# Patient Record
Sex: Female | Born: 1955 | Race: Black or African American | Hispanic: No | Marital: Married | Smoking: Never smoker
Health system: Southern US, Community
[De-identification: ages and names within clinical notes are randomized; demographics above are authoritative.]

## PROBLEM LIST (undated history)

## (undated) DIAGNOSIS — R87610 Atypical squamous cells of undetermined significance on cytologic smear of cervix (ASC-US): Secondary | ICD-10-CM

## (undated) DIAGNOSIS — I7300 Raynaud's syndrome without gangrene: Secondary | ICD-10-CM

## (undated) DIAGNOSIS — F419 Anxiety disorder, unspecified: Secondary | ICD-10-CM

## (undated) DIAGNOSIS — K8020 Calculus of gallbladder without cholecystitis without obstruction: Secondary | ICD-10-CM

## (undated) DIAGNOSIS — E119 Type 2 diabetes mellitus without complications: Secondary | ICD-10-CM

## (undated) DIAGNOSIS — R569 Unspecified convulsions: Secondary | ICD-10-CM

## (undated) DIAGNOSIS — R51 Headache: Secondary | ICD-10-CM

## (undated) DIAGNOSIS — R519 Headache, unspecified: Secondary | ICD-10-CM

## (undated) DIAGNOSIS — I10 Essential (primary) hypertension: Secondary | ICD-10-CM

## (undated) DIAGNOSIS — B1910 Unspecified viral hepatitis B without hepatic coma: Secondary | ICD-10-CM

## (undated) DIAGNOSIS — F32A Depression, unspecified: Secondary | ICD-10-CM

## (undated) DIAGNOSIS — E669 Obesity, unspecified: Secondary | ICD-10-CM

## (undated) DIAGNOSIS — F8081 Childhood onset fluency disorder: Secondary | ICD-10-CM

## (undated) DIAGNOSIS — G8929 Other chronic pain: Secondary | ICD-10-CM

## (undated) DIAGNOSIS — K219 Gastro-esophageal reflux disease without esophagitis: Secondary | ICD-10-CM

## (undated) DIAGNOSIS — F329 Major depressive disorder, single episode, unspecified: Secondary | ICD-10-CM

## (undated) DIAGNOSIS — K5790 Diverticulosis of intestine, part unspecified, without perforation or abscess without bleeding: Secondary | ICD-10-CM

## (undated) HISTORY — PX: COLONOSCOPY: SHX174

## (undated) HISTORY — DX: Anxiety disorder, unspecified: F41.9

## (undated) HISTORY — DX: Gastro-esophageal reflux disease without esophagitis: K21.9

## (undated) HISTORY — DX: Essential (primary) hypertension: I10

## (undated) HISTORY — DX: Headache: R51

## (undated) HISTORY — DX: Major depressive disorder, single episode, unspecified: F32.9

## (undated) HISTORY — DX: Childhood onset fluency disorder: F80.81

## (undated) HISTORY — DX: Type 2 diabetes mellitus without complications: E11.9

## (undated) HISTORY — DX: Diverticulosis of intestine, part unspecified, without perforation or abscess without bleeding: K57.90

## (undated) HISTORY — DX: Unspecified viral hepatitis B without hepatic coma: B19.10

## (undated) HISTORY — DX: Depression, unspecified: F32.A

## (undated) HISTORY — DX: Calculus of gallbladder without cholecystitis without obstruction: K80.20

## (undated) HISTORY — DX: Obesity, unspecified: E66.9

## (undated) HISTORY — DX: Unspecified convulsions: R56.9

## (undated) HISTORY — DX: Raynaud's syndrome without gangrene: I73.00

## (undated) HISTORY — DX: Atypical squamous cells of undetermined significance on cytologic smear of cervix (ASC-US): R87.610

## (undated) HISTORY — DX: Headache, unspecified: R51.9

## (undated) HISTORY — DX: Other chronic pain: G89.29

---

## 1981-04-24 DIAGNOSIS — B169 Acute hepatitis B without delta-agent and without hepatic coma: Secondary | ICD-10-CM | POA: Insufficient documentation

## 1982-03-19 ENCOUNTER — Encounter: Payer: Self-pay | Admitting: Gastroenterology

## 1982-04-28 ENCOUNTER — Encounter: Payer: Self-pay | Admitting: Gastroenterology

## 1982-07-04 ENCOUNTER — Encounter: Payer: Self-pay | Admitting: Gastroenterology

## 1997-09-24 ENCOUNTER — Emergency Department (HOSPITAL_COMMUNITY): Admission: EM | Admit: 1997-09-24 | Discharge: 1997-09-24 | Payer: Self-pay | Admitting: Emergency Medicine

## 1998-01-29 ENCOUNTER — Other Ambulatory Visit: Admission: RE | Admit: 1998-01-29 | Discharge: 1998-01-29 | Payer: Self-pay | Admitting: Family Medicine

## 1998-04-24 HISTORY — PX: CHOLECYSTECTOMY: SHX55

## 1998-04-24 HISTORY — PX: OTHER SURGICAL HISTORY: SHX169

## 1998-08-17 ENCOUNTER — Other Ambulatory Visit: Admission: RE | Admit: 1998-08-17 | Discharge: 1998-08-17 | Payer: Self-pay | Admitting: *Deleted

## 1998-12-28 ENCOUNTER — Encounter (INDEPENDENT_AMBULATORY_CARE_PROVIDER_SITE_OTHER): Payer: Self-pay

## 1998-12-28 ENCOUNTER — Observation Stay (HOSPITAL_COMMUNITY): Admission: RE | Admit: 1998-12-28 | Discharge: 1998-12-29 | Payer: Self-pay | Admitting: *Deleted

## 2000-08-09 ENCOUNTER — Other Ambulatory Visit: Admission: RE | Admit: 2000-08-09 | Discharge: 2000-08-09 | Payer: Self-pay | Admitting: *Deleted

## 2004-12-02 ENCOUNTER — Encounter: Admission: RE | Admit: 2004-12-02 | Discharge: 2004-12-02 | Payer: Self-pay | Admitting: Family Medicine

## 2008-07-09 ENCOUNTER — Encounter: Admission: RE | Admit: 2008-07-09 | Discharge: 2008-07-09 | Payer: Self-pay | Admitting: Family Medicine

## 2008-09-30 ENCOUNTER — Encounter: Admission: RE | Admit: 2008-09-30 | Discharge: 2008-09-30 | Payer: Self-pay | Admitting: Family Medicine

## 2008-09-30 LAB — HM MAMMOGRAPHY: HM Mammogram: NEGATIVE

## 2008-10-05 ENCOUNTER — Encounter (INDEPENDENT_AMBULATORY_CARE_PROVIDER_SITE_OTHER): Payer: Self-pay | Admitting: Orthopedic Surgery

## 2008-10-05 ENCOUNTER — Ambulatory Visit: Payer: Self-pay

## 2008-10-13 ENCOUNTER — Ambulatory Visit (HOSPITAL_COMMUNITY): Admission: RE | Admit: 2008-10-13 | Discharge: 2008-10-13 | Payer: Self-pay | Admitting: Family Medicine

## 2008-10-13 ENCOUNTER — Encounter: Payer: Self-pay | Admitting: Emergency Medicine

## 2008-11-10 ENCOUNTER — Ambulatory Visit (HOSPITAL_COMMUNITY): Admission: RE | Admit: 2008-11-10 | Discharge: 2008-11-10 | Payer: Self-pay | Admitting: Family Medicine

## 2008-11-10 ENCOUNTER — Encounter: Payer: Self-pay | Admitting: Emergency Medicine

## 2009-01-13 ENCOUNTER — Ambulatory Visit: Payer: Self-pay | Admitting: Emergency Medicine

## 2009-01-13 DIAGNOSIS — R7303 Prediabetes: Secondary | ICD-10-CM | POA: Insufficient documentation

## 2009-01-13 DIAGNOSIS — I10 Essential (primary) hypertension: Secondary | ICD-10-CM | POA: Insufficient documentation

## 2009-01-13 DIAGNOSIS — R0602 Shortness of breath: Secondary | ICD-10-CM | POA: Insufficient documentation

## 2009-04-29 ENCOUNTER — Emergency Department (HOSPITAL_COMMUNITY): Admission: EM | Admit: 2009-04-29 | Discharge: 2009-04-29 | Payer: Self-pay | Admitting: Emergency Medicine

## 2009-04-29 LAB — CONVERTED CEMR LAB
Basophils Relative: 1 %
Eosinophils Relative: 2 %
HCT: 40 %
Hemoglobin: 13.6 g/dL
Lymphocytes, automated: 35 %
MCV: 90 fL
Monocytes Relative: 8 %
Neutrophils Relative %: 55 %
Platelets: 309 10*3/uL
RBC: 4.21 M/uL
RDW: 14.8 %
WBC: 6.2 10*3/uL

## 2009-05-14 ENCOUNTER — Encounter (INDEPENDENT_AMBULATORY_CARE_PROVIDER_SITE_OTHER): Payer: Self-pay | Admitting: *Deleted

## 2009-06-08 ENCOUNTER — Ambulatory Visit: Payer: Self-pay | Admitting: Gastroenterology

## 2009-06-08 DIAGNOSIS — R12 Heartburn: Secondary | ICD-10-CM | POA: Insufficient documentation

## 2009-06-08 DIAGNOSIS — R109 Unspecified abdominal pain: Secondary | ICD-10-CM | POA: Insufficient documentation

## 2009-06-09 LAB — CONVERTED CEMR LAB
ALT: 16 units/L (ref 0–35)
AST: 18 units/L (ref 0–37)
Albumin: 3.9 g/dL (ref 3.5–5.2)
Alkaline Phosphatase: 80 units/L (ref 39–117)
BUN: 13 mg/dL (ref 6–23)
Basophils Absolute: 0.2 10*3/uL — ABNORMAL HIGH (ref 0.0–0.1)
Basophils Relative: 4.1 % — ABNORMAL HIGH (ref 0.0–3.0)
Bilirubin, Direct: 0.1 mg/dL (ref 0.0–0.3)
CO2: 30 meq/L (ref 19–32)
Calcium: 9.4 mg/dL (ref 8.4–10.5)
Chloride: 102 meq/L (ref 96–112)
Creatinine, Ser: 0.8 mg/dL (ref 0.4–1.2)
Eosinophils Absolute: 0.1 10*3/uL (ref 0.0–0.7)
Eosinophils Relative: 2.1 % (ref 0.0–5.0)
GFR calc non Af Amer: 95.56 mL/min (ref >60)
Glucose, Bld: 83 mg/dL (ref 70–99)
HCT: 39.6 % (ref 36.0–46.0)
Hemoglobin: 13.3 g/dL (ref 12.0–15.0)
Lipase: 13 units/L (ref 11.0–59.0)
Lymphocytes Relative: 35.3 % (ref 12.0–46.0)
Lymphs Abs: 1.8 10*3/uL (ref 0.7–4.0)
MCHC: 33.6 g/dL (ref 30.0–36.0)
MCV: 90.9 fL (ref 78.0–100.0)
Monocytes Absolute: 0.2 10*3/uL (ref 0.1–1.0)
Monocytes Relative: 4.8 % (ref 3.0–12.0)
Neutro Abs: 2.8 10*3/uL (ref 1.4–7.7)
Neutrophils Relative %: 53.7 % (ref 43.0–77.0)
Platelets: 281 10*3/uL (ref 150.0–400.0)
Potassium: 3.7 meq/L (ref 3.5–5.1)
RBC: 4.35 M/uL (ref 3.87–5.11)
RDW: 14.3 % (ref 11.5–14.6)
Sodium: 139 meq/L (ref 135–145)
TSH: 0.95 microintl units/mL (ref 0.35–5.50)
Total Bilirubin: 0.4 mg/dL (ref 0.3–1.2)
Total Protein: 7.5 g/dL (ref 6.0–8.3)
WBC: 5.1 10*3/uL (ref 4.5–10.5)

## 2009-06-28 ENCOUNTER — Ambulatory Visit: Payer: Self-pay | Admitting: Internal Medicine

## 2009-07-07 ENCOUNTER — Ambulatory Visit: Payer: Self-pay | Admitting: Gastroenterology

## 2009-07-07 DIAGNOSIS — K219 Gastro-esophageal reflux disease without esophagitis: Secondary | ICD-10-CM | POA: Insufficient documentation

## 2009-07-21 ENCOUNTER — Telehealth: Payer: Self-pay | Admitting: Gastroenterology

## 2009-07-21 ENCOUNTER — Ambulatory Visit: Payer: Self-pay | Admitting: Internal Medicine

## 2009-07-21 DIAGNOSIS — I7300 Raynaud's syndrome without gangrene: Secondary | ICD-10-CM | POA: Insufficient documentation

## 2009-07-21 DIAGNOSIS — E669 Obesity, unspecified: Secondary | ICD-10-CM | POA: Insufficient documentation

## 2009-07-23 ENCOUNTER — Ambulatory Visit: Payer: Self-pay | Admitting: Gastroenterology

## 2009-07-23 LAB — HM COLONOSCOPY: HM Colonoscopy: NORMAL

## 2009-07-26 ENCOUNTER — Encounter (INDEPENDENT_AMBULATORY_CARE_PROVIDER_SITE_OTHER): Payer: Self-pay | Admitting: *Deleted

## 2009-09-10 ENCOUNTER — Ambulatory Visit: Payer: Self-pay | Admitting: Internal Medicine

## 2009-09-10 DIAGNOSIS — L02419 Cutaneous abscess of limb, unspecified: Secondary | ICD-10-CM | POA: Insufficient documentation

## 2009-09-10 DIAGNOSIS — F411 Generalized anxiety disorder: Secondary | ICD-10-CM | POA: Insufficient documentation

## 2009-09-10 DIAGNOSIS — L03119 Cellulitis of unspecified part of limb: Secondary | ICD-10-CM | POA: Insufficient documentation

## 2009-10-08 ENCOUNTER — Ambulatory Visit: Payer: Self-pay | Admitting: Internal Medicine

## 2009-10-08 DIAGNOSIS — Z780 Asymptomatic menopausal state: Secondary | ICD-10-CM | POA: Insufficient documentation

## 2010-01-19 ENCOUNTER — Ambulatory Visit: Payer: Self-pay | Admitting: Internal Medicine

## 2010-01-19 DIAGNOSIS — G2581 Restless legs syndrome: Secondary | ICD-10-CM | POA: Insufficient documentation

## 2010-05-26 NOTE — Letter (Signed)
Summary: [facility] Instructions  [facility]  [address] [place], [place] [address]   Phone: [phone]  Fax: [phone]       Suzanne [name]    [birth year]    MRN: [id number]        Procedure Day /Date: Friday April 1st, 2011     Arrival Time: 2:00pm     Procedure Time: 3:00pm     Location of Procedure:                    _ x_   Endoscopy Center (4th Floor)                        PREPARATION FOR COLONOSCOPY WITH MOVIPREP   Starting 5 days prior to your procedure  07/19/09 do not eat nuts, seeds, popcorn, corn, beans, peas,  salads, or any raw vegetables.  Do not take any fiber supplements (e.g. Metamucil, Citrucel, and Benefiber).  THE DAY BEFORE YOUR PROCEDURE         DATE:  07/22/09   DAY:  Thursday   1.  Drink clear liquids the entire day-NO SOLID FOOD  2.  Do not drink anything colored red or purple.  Avoid juices with pulp.  No orange juice.  3.  Drink at least 64 oz. (8 glasses) of fluid/clear liquids during the day to prevent dehydration and help the prep work efficiently.  CLEAR LIQUIDS INCLUDE: Water Jello Ice Popsicles Tea (sugar ok, no milk/cream) Powdered fruit flavored drinks Coffee (sugar ok, no milk/cream) Gatorade Juice: apple, white grape, white cranberry  Lemonade Clear bullion, consomm, broth Carbonated beverages (any kind) Strained chicken noodle soup Hard Candy                             4.  In the morning, mix first dose of MoviPrep solution:    Empty 1 Pouch A and 1 Pouch B into the disposable container    Add lukewarm drinking water to the top line of the container. Mix to dissolve    Refrigerate (mixed solution should be used within 24 hrs)  5.  Begin drinking the prep at 5:00 p.m. The MoviPrep container is divided by 4 marks.   Every 15 minutes drink the solution down to the next mark (approximately 8 oz) until the full liter is complete.   6.  Follow completed prep with 16 oz of clear liquid of  your choice (Nothing red or purple).  Continue to drink clear liquids until bedtime.  7.  Before going to bed, mix second dose of MoviPrep solution:    Empty 1 Pouch A and 1 Pouch B into the disposable container    Add lukewarm drinking water to the top line of the container. Mix to dissolve    Refrigerate  THE DAY OF YOUR PROCEDURE      DATE:  07/23/09  DAY:  Friday  Beginning at  11:00 a.m. (5 hours before procedure):         1. Every 15 minutes, drink the solution down to the next mark (approx 8 oz) until the full liter is complete.  2. Follow completed prep with 16 oz. of clear liquid of your choice.    3. You may drink clear liquids until  1:00pm  (2 HOURS BEFORE PROCEDURE).   MEDICATION INSTRUCTIONS  Unless otherwise instructed, you should take regular prescription medications with a small sip of  water   as early as possible the morning of your procedure.          OTHER INSTRUCTIONS  You will need a responsible adult at least 57 years of age to accompany you and drive you home.   This person must remain in the waiting room during your procedure.  Wear loose fitting clothing that is easily removed.  Leave jewelry and other valuables at home.  However, you may wish to bring a book to read or  an iPod/MP3 player to listen to music as you wait for your procedure to start.  Remove all body piercing jewelry and leave at home.  Total time from sign-in until discharge is approximately 2-3 hours.  You should go home directly after your procedure and rest.  You can resume normal activities the  day after your procedure.  The day of your procedure you should not:   Drive   Make legal decisions   Operate machinery   Drink alcohol   Return to work  You will receive specific instructions about eating, activities and medications before you leave.    The above instructions have been reviewed and explained to me by   Marchelle Folks.     I fully understand and can  verbalize these instructions _____________________________ Date _________

## 2010-05-26 NOTE — Assessment & Plan Note (Signed)
Summary: stomach trouble, diarrhea, change in bms, right side pain.Marland Kitchenem   History of Present Illness Visit Type: Initial Visit Primary GI MD: Elie Goody MD [facility] Primary Provider: Sheliah Hatch Requesting Provider: n/a Chief Complaint: Periumbilical pain with esophageal burning History of Present Illness:   This is a 55 year old female, who relates problems with intermittent lower abdominal burning pain since a diarrheal illness in December. Her diarrhea resolved, but she has episodic pain, that does not appear to be related to bowel movements or meals. It does relate occasionally to gas and bloating. She relates occasional reflux symptoms and burning substernal pain.  She thinks she had a colonoscopy performed about 5 years ago, but cannot recall any specifics.   GI Review of Systems    Reports abdominal pain and  chest pain.     Location of  Abdominal pain: lower abdomen.    Denies acid reflux, belching, bloating, dysphagia with liquids, dysphagia with solids, heartburn, loss of appetite, nausea, vomiting, vomiting blood, weight loss, and  weight gain.      Reports diarrhea.     Denies anal fissure, black tarry stools, change in bowel habit, constipation, diverticulosis, fecal incontinence, heme positive stool, hemorrhoids, irritable bowel syndrome, jaundice, light color stool, liver problems, rectal bleeding, and  rectal pain.   Current Medications (verified): 1)  Metoprolol Tartrate 100 Mg Tabs (Metoprolol Tartrate) .Marland Kitchen.. 1 By Mouth Daily 2)  Junel Fe 1/20 1-20 Mg-Mcg Tabs (Norethin Ace-Eth Estrad-Fe) .Marland Kitchen.. 1 By Mouth Daily  Allergies (verified): 1)  ! * Seafood  Past History:  Past Medical History: PRE-DIABETES (ICD-790.29) HYPERTENSION (ICD-401.9) Anxiety Disorder Arthritis Chronic Headaches Diverticulosis Acute Hepatitis B 1983 Hypertension Obesity  Past Surgical History: Cholecystectomy  2000  Family History: Allergies--brother and son Family History Lung  Cancer==father and maternal aunt Family History Colon Cancer--maternal aunt Family History of Clotting disorder:   Social History: Reviewed history from 01/13/2009 and no changes required. [place].  Patient states never smoker.  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         The pertinent positives and negatives are noted as above and in the HPI. All other ROS were reviewed and were negative.   Vital Signs:  Patient profile:   55 year old female Height:      64 inches Weight:      177 pounds BMI:     30.49 BSA:     1.86 Pulse rate:   80 / minute Pulse rhythm:   regular BP sitting:   136 / 72  (left arm)  Vitals Entered By: Merri Ray CMA Duncan Dull) (June 08, 2009 3:02 PM)  Physical Exam  General:  Well developed, well nourished, no acute distress. Head:  Normocephalic and atraumatic. Eyes:  PERRLA, no icterus. Ears:  Normal auditory acuity. Mouth:  No deformity or lesions, dentition normal. Neck:  Supple; no masses or thyromegaly. Lungs:  Clear throughout to auscultation. Heart:  Regular rate and rhythm; no murmurs, rubs,  or bruits. Abdomen:  Soft and nondistended. No masses, hepatosplenomegaly or hernias noted. Normal bowel sounds. Minimal lower abdominal tenderness to deep palpation without rebound or guarding. Msk:  Symmetrical with no gross deformities. Normal posture. Pulses:  Normal pulses noted. Extremities:  No  clubbing, cyanosis, edema or deformities noted. Neurologic:  Alert and  oriented x4;  grossly normal neurologically. Skin:  Intact without significant lesions or rashes. Cervical Nodes:  No significant cervical adenopathy. Axillary Nodes:  No significant axillary  adenopathy. Inguinal Nodes:  No significant inguinal adenopathy. Psych:  Alert and cooperative. Normal mood and affect.  Impression & Recommendations:  Problem # 1:  ABDOMINAL PAIN OTHER SPECIFIED SITE (ICD-789.09) Lower abdominal pain, associated with gas and bloating. Blood work and stool Hemoccults as below. Attempts to obtain prior colonoscopy records. Trial of anti-spasmodic. Orders: TLB-BMP (Basic Metabolic Panel-BMET) (80048-METABOL) TLB-Hepatic/Liver Function Pnl (80076-HEPATIC) TLB-CBC Platelet - w/Differential (85025-CBCD) TLB-TSH (Thyroid Stimulating Hormone) (84443-TSH) TLB-Lipase (83690-LIPASE)  GI Hemoccult Cards #3 (take home) (Hem cards #3)  Problem # 2:  HEARTBURN (ICD-787.1) Possible GERD. Trial of acid suppressants and antireflux measures.  Patient Instructions: 1)  Get your labs drawn today in the basement.  2)  You have been scheduled to see Dr. Felicity Coyer for a new patient appt on 06/28/09 8:00am. Call if you need to reschedule your appt to avoid the n/s fee.  3)  Call us back with the information about your past colonoscopy report so you do not have to repeat this procedure.  4)  Please schedule a follow-up appointment in 4  weeks.  5)  Copy sent to : Rene Paci, MD 6)  The medication list was reviewed and reconciled.  All changed / newly prescribed medications were explained.  A complete medication list was provided to the patient / caregiver.  Prescriptions: GLYCOPYRROLATE 2 MG TABS (GLYCOPYRROLATE) one tablet by mouth two times a day  #60 x 5   Entered by:   Christie Nottingham CMA (AAMA)   Authorized by:   Meryl Dare MD [facility]   Signed by:   Newt Lukes MD on 06/09/2009   Method used:   Electronically to        [facility] (retail)       [address]       [place], [place]  [address]       Ph: [phone]       Fax: [phone]   RxID:   [id number] OMEPRAZOLE 20 MG CPDR (OMEPRAZOLE) one tablet by  mouth every morning  #30 x 5   Entered by:   Christie Nottingham CMA (AAMA)   Authorized by:   Meryl Dare MD [facility]   Signed by:   Newt Lukes MD on 06/09/2009   Method used:   Electronically to        [facility] (retail)       [address]       [place], [place]  [address]       Ph: [phone]       Fax: [phone]   RxID:   [id number]

## 2010-05-26 NOTE — Assessment & Plan Note (Signed)
Summary: [name] /  NWS #   Vital Signs:  Patient profile:   55 year old female Height:      64 inches (162.56 cm) Weight:      179.8 pounds (81.73 kg) Temp:     97.5 degrees F (36.39 degrees C) oral Pulse rate:   721 / minute BP sitting:   134 / 72  (left arm) Cuff size:   regular  Vitals Entered By: Suzanne [name] (July 21, 2009 3:09 PM) CC: New patient Is Patient Diabetic? No Pain Assessment Patient in pain? no        Primary Care Provider:  Newt Lukes MD  CC:  New patient.  History of Present Illness: new pt to me and our division, here to est care with new PCP prev followed with Suzanne [name] -  1) concern about finger tip numbness and discoloration - symptoms occur in any fingers, not always same one or same pattern - symptoms worse with cold weather symptoms a/w "pins and needle feeling" - episodes last few minutes to 30 minutes per episode - no fever, no pain a/w color change to blue/black  2) requests PAP and refill on OCP - reports med rx by PCP to keep from having hot flashes - not having periods -  3) GERD - sees GI for same - reports compliance with ongoing medical treatment and no changes in medication dose or frequency. denies adverse side effects related to current therapy.   4) HTN - reports compliance with ongoing medical treatment and no changes in medication dose or frequency. denies adverse side effects related to current therapy.   5) obesity with hx "preDM" - has lost over 30# intentionally with diet and exercise efforts -      Preventive Screening-Counseling & Management  Alcohol-Tobacco     Smoking Status: quit  Clinical Review Panels:  Prevention   Last Mammogram:  [facility].   No specific mammographic evidence of malignancy.  Assessment: BIRADS 1. (09/30/2008)  CBC   WBC:  5.1 (06/08/2009)   RBC:  4.35 (06/08/2009)   Hgb:  13.3 (06/08/2009)   Hct:  39.6 (06/08/2009)   Platelets:   281.0 (06/08/2009)   MCV  90.9 (06/08/2009)   MCHC  33.6 (06/08/2009)   RDW  14.3 (06/08/2009)   PMN:  53.7 (06/08/2009)   Lymphs:  35.3 (06/08/2009)   Monos:  4.8 (06/08/2009)   Eosinophils:  2.1 (06/08/2009)   Basophil:  4.1 (06/08/2009)  Complete Metabolic Panel   Glucose:  83 (06/08/2009)   Sodium:  139 (06/08/2009)   Potassium:  3.7 (06/08/2009)   Chloride:  102 (06/08/2009)   CO2:  30 (06/08/2009)   BUN:  13 (06/08/2009)   Creatinine:  0.8 (06/08/2009)   Albumin:  3.9 (06/08/2009)   Total Protein:  7.5 (06/08/2009)   Calcium:  9.4 (06/08/2009)   Total Bili:  0.4 (06/08/2009)   Alk Phos:  80 (06/08/2009)   SGPT (ALT):  16 (06/08/2009)   SGOT (AST):  18 (06/08/2009)   -  Date:  04/29/2009    Lymphs: 35  Current Medications (verified): 1)  Metoprolol Tartrate 100 Mg Tabs (Metoprolol Tartrate) .Marland Kitchen.. 1 By Mouth Daily 2)  Junel Fe 1/20 1-20 Mg-Mcg Tabs (Norethin Ace-Eth Estrad-Fe) .Marland Kitchen.. 1 By Mouth Daily 3)  Omeprazole 20 Mg Cpdr (Omeprazole) .... One Tablet By Mouth Every Morning 4)  Moviprep 100 Gm  Solr (Peg-Kcl-Nacl-Nasulf-Na Asc-C) .... As Per Prep Instructions.  Allergies (verified): 1)  ! *  Seafood  Past History:  Past Medical History: hx PRE-DIABETES Anxiety Disorder Arthritis Chronic Headaches Diverticulosis Acute Hepatitis B 1983 Hypertension Obesity  MD rooster: GI - stark pulm - byrum  Past Surgical History: Reviewed history from 06/08/2009 and no changes required. Cholecystectomy  2000  Family History: Allergies--brother and son Family History Lung Cancer==father and maternal aunt Family History Colon Cancer--maternal aunt Family History of Clotting disorder:   mom - 84y - bone dz dad - expired in 79s - lung ca  Social History: [place] up local, [place] farm.  Patient states never smoker. 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 I have reviewed all other systems and they were negative.   Physical Exam  General:  overweight-appearing.  alert, well-developed, well-nourished, and cooperative to examination.   well dressed, marked stutter with conversation Eyes:  vision grossly intact; pupils equal, round and reactive to light.  conjunctiva and lids normal.    Lungs:  normal respiratory effort, no intercostal retractions or use of accessory muscles; normal breath sounds bilaterally - no crackles and no wheezes.    Heart:  normal rate, regular rhythm, no murmur, and no rub. BLE without edema. normal DP pulses and normal cap refill in all 4 extremities    Abdomen:  Soft, nontender and nondistended. No masses, hepatosplenomegaly or hernias noted. Normal bowel sounds. Genitalia:  defer to gyn  Msk:  No deformity or scoliosis noted of thoracic or lumbar spine.   Neurologic:  alert & oriented X3 and cranial nerves II-XII symetrically intact.  strength normal in all extremities, sensation intact to light touch, and gait normal. marked speech stuttering but without dysarthria or aphasia; follows commands with good comprehension.  Skin:  +raynaud's changes - bluish tips in R 2,3 and 4; L 2 +4 - cool to touch initially - then warms and "pinks" within 5 minutes during OV interview Psych:  Oriented X3, memory intact for recent and remote, normally interactive, good eye contact, not anxious appearing, not depressed appearing, and not agitated.      Impression & Recommendations:  Problem # 1:  RAYNAUDS SYNDROME (ICD-443.0) reassurance and education material provided to pt today -  Time spent with patient 45 minutes, more than 50% of this time was spent counseling patient on raynaud's, need for weight loss and exercise, and need for gyn eval to asst with hormone mgmt -  Problem # 2:  OBESITY (ICD-278.00)  Ht: 64 (07/21/2009)   Wt: 179.8 (07/21/2009)   BMI: 4.70 (07/07/2009)  Problem # 3:  HYPERTENSION  (ICD-401.9)  Her updated medication list for this problem includes:    Metoprolol Tartrate 100 Mg Tabs (Metoprolol tartrate) .Marland Kitchen... 1 by mouth daily  BP today: 134/72 Prior BP: 124/82 (07/07/2009)  Labs Reviewed: K+: 3.7 (06/08/2009) Creat: : 0.8 (06/08/2009)     Problem # 4:  GERD (ICD-530.81)  The following medications were removed from the medication list:    Glycopyrrolate 2 Mg Tabs (Glycopyrrolate) ..... One tablet by mouth two times a day Her updated medication list for this problem includes:    Omeprazole 20 Mg Cpdr (Omeprazole) ..... One tablet by mouth every morning  Labs Reviewed: Hgb: 13.3 (06/08/2009)   Hct: 39.6 (06/08/2009)  Complete Medication List: 1)  Metoprolol Tartrate 100 Mg Tabs (Metoprolol tartrate) .Marland Kitchen.. 1 by mouth daily 2)  Junel Fe 1/20 1-20  Mg-mcg Tabs (Norethin ace-eth estrad-fe) .Marland Kitchen.. 1 by mouth daily 3)  Omeprazole 20 Mg Cpdr (Omeprazole) .... One tablet by mouth every morning 4)  Moviprep 100 Gm Solr (Peg-kcl-nacl-nasulf-na asc-c) .... As per prep instructions.  Other Orders: Gynecologic Referral (Gyn)  Patient Instructions: 1)  it was good to see you today. 2)  your finger tip discoloration is called "raynaud's" - information given to you about this - nothing to worry about, not diabetes! 3)  we'll make referral to gynecology for a PAP and to discuss your hormone pills because they may need to make changes in your medicatins. Our office will contact you regarding this appointment once made.  4)  keep up the good work with your weight loss efforts! 5)  no medicine changes today - call for refills as needed - 6)  Please schedule a follow-up appointment in 6 months to recheck weight and blood pressure, call sooner if problems.  Prescriptions: JUNEL FE 1/20 1-20 MG-MCG TABS (NORETHIN ACE-ETH ESTRAD-FE) 1 by mouth daily  #1 x 0   Entered and Authorized by:   Suzanne Lukes MD   Signed by:   Suzanne Lukes MD on 07/21/2009   Method used:    Electronically to        [facility] (retail)       [address]       [place], [place]  [address]       Ph: [phone]       Fax: [phone]   RxID:   [id number]    Mammogram  Procedure date:  09/30/2008  Findings:      [facility].   No specific mammographic evidence of malignancy.  Assessment: BIRADS 1.

## 2010-05-26 NOTE — Procedures (Signed)
Summary: Colonoscopy  Patient: [name] Note: All result statuses are Final unless otherwise noted.  Tests: (1) Colonoscopy (COL)   COL Colonoscopy           DONE     [facility]     [address]     [place], [place]  [address]           COLONOSCOPY PROCEDURE REPORT           PATIENT:  Suzanne [name], Suzanne [name]  MR#:  [id number]     BIRTHDATE:  01-12-1956, 53 yrs. old  GENDER:  female           ENDOSCOPIST:  Judie Petit T. Russella Dar, MD, [facility]           PROCEDURE DATE:  07/23/2009     PROCEDURE:  Colonoscopy 95[address]     ASA CLASS:  Class II     INDICATIONS:  1) Routine Risk Screening           MEDICATIONS:   Fentanyl 100 mcg IV, Versed 10 mg IV           DESCRIPTION OF PROCEDURE:   After the risks benefits and     alternatives of the procedure were thoroughly explained, informed     consent was obtained.  Digital rectal exam was performed and     revealed no abnormalities.   The LB [id number]-[id number] endoscope     was introduced through the anus and advanced to the cecum, which     was identified by both the appendix and ileocecal valve, without     limitations.  The quality of the prep was excellent, using     MoviPrep.  The instrument was then slowly withdrawn as the colon     was fully examined.     <<PROCEDUREIMAGES>>           FINDINGS:  Scattered diverticula were found in the transverse     colon. Moderate diverticulosis was found in the sigmoid to     descending colon segments.  A normal appearing cecum, ileocecal     valve, and appendiceal orifice were identified. The ascending,     hepatic flexure, splenic flexure, and rectum appeared     unremarkable.  Retroflexed views in the rectum revealed no     abnormalities.  The time to cecum =  3  minutes. The scope was     then withdrawn (time =  9.25  min) from the patient and the     procedure completed.           COMPLICATIONS:  None           ENDOSCOPIC IMPRESSION:     1) Diverticula, scattered in the  transverse colon     2) Moderate diverticulosis in the sigmoid to descending colon           RECOMMENDATIONS:     1) High fiber diet with liberal fluid intake.     2) Continue current colorectal screening recommendations for     "routine risk" patients with a repeat colonoscopy in 10 years.           Venita Lick. Russella Dar, MD, [facility]           CC: Windle Guard, MD           n.     Rosalie DoctorVenita Lick. Momen Ham at 07/23/2009 03:31 PM           [place], [place], [id number]  Note: An exclamation mark (!) indicates a result that was not dispersed into the flowsheet. Document Creation Date: 07/23/2009 3:32 PM _______________________________________________________________________  (1) Order result status: Final Collection or observation date-time: 07/23/2009 15:23 Requested date-time:  Receipt date-time:  Reported date-time:  Referring Physician:   Ordering Physician: Claudette Head [phone]) Specimen Source:  Source: Launa Grill Order Number: [id number] Lab site:   Appended Document: Colonoscopy    Clinical Lists Changes  Observations: Added new observation of COLONNXTDUE: 07/2019 (07/23/2009 16:30)

## 2010-05-26 NOTE — Assessment & Plan Note (Signed)
Summary: 4 WEEKS F/U..AM.   History of Present Illness Visit Type: Follow-up Visit Primary GI MD: Elie Goody MD [facility] Primary Provider: Sheliah Hatch Requesting Provider: n/a Chief Complaint: follow up from abdominal pain and diarrhea.  Pt states the abdominal pain and diarrhea are better, but she is having "spasms" in the middle of her back History of Present Illness:   This is a return office visit for lower abdominal pain and heartburn, which completely resolved with the use of glycopyrrolate and omeprazole. She did not return her stool Hemoccults.   GI Review of Systems      Denies abdominal pain, acid reflux, belching, bloating, chest pain, dysphagia with liquids, dysphagia with solids, heartburn, loss of appetite, nausea, vomiting, vomiting blood, weight loss, and  weight gain.        Denies anal fissure, black tarry stools, change in bowel habit, constipation, diarrhea, diverticulosis, fecal incontinence, heme positive stool, hemorrhoids, irritable bowel syndrome, jaundice, light color stool, liver problems, rectal bleeding, and  rectal pain.   Current Medications (verified): 1)  Metoprolol Tartrate 100 Mg Tabs (Metoprolol Tartrate) .Marland Kitchen.. 1 By Mouth Daily 2)  Junel Fe 1/20 1-20 Mg-Mcg Tabs (Norethin Ace-Eth Estrad-Fe) .Marland Kitchen.. 1 By Mouth Daily 3)  Omeprazole 20 Mg Cpdr (Omeprazole) .... One Tablet By Mouth Every Morning 4)  Glycopyrrolate 2 Mg Tabs (Glycopyrrolate) .... One Tablet By Mouth Two Times A Day  Allergies (verified): 1)  ! * Seafood  Past History:  Past Medical History: Reviewed history from 06/08/2009 and no changes required. PRE-DIABETES (ICD-790.29) HYPERTENSION (ICD-401.9) Anxiety Disorder Arthritis Chronic Headaches Diverticulosis Acute Hepatitis B 1983 Hypertension Obesity  Past Surgical History: Reviewed history from 06/08/2009 and no changes required. Cholecystectomy  2000  Family History: Reviewed history from 06/08/2009 and no changes  required. Allergies--brother and son Family History Lung Cancer==father and maternal aunt Family History Colon Cancer--maternal aunt Family History of Clotting disorder:   Social History: Reviewed history from 01/13/2009 and no changes required. [place] farm.  Patient states never smoker.  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 All other ROS were reviewed and were negative.   Vital Signs:  Patient profile:   55 year old female Height:      164 inches Weight:      179 pounds BMI:     4.70 Pulse rate:   72 / minute Pulse rhythm:   regular BP sitting:   124 / 82  (left arm) Cuff size:   regular  Vitals Entered By: Francee Piccolo CMA Duncan Dull) (July 07, 2009 4:18 PM)  Physical Exam  General:  Well developed, well nourished, no acute distress. Head:  Normocephalic and atraumatic. Eyes:  PERRLA, no icterus. Mouth:  No deformity or lesions, dentition normal. Lungs:  Clear throughout to auscultation. Heart:  Regular rate and rhythm; no murmurs, rubs,  or bruits. Abdomen:  Soft, nontender and nondistended. No masses, hepatosplenomegaly or hernias noted. Normal bowel sounds. Psych:  Alert and cooperative. Normal mood and affect.  Impression & Recommendations:  Problem # 1:  ABDOMINAL PAIN OTHER SPECIFIED SITE (ICD-789.09) Lower abdominal pain has resolved. Change glycopyrrolate to twice daily as needed. Orders: Colonoscopy (Colon)  Problem # 2:  SPECIAL SCREENING FOR MALIGNANT NEOPLASMS COLON (ICD-V76.51) Average risk for colorectal cancer. The patient has not been able to locate  her prior colonoscopy records, but she states her procedure was performed greater than 7 or 8 years ago. The risks, benefits and alternatives to colonoscopy with possible biopsy and possible polypectomy were discussed with the  patient and they consent to proceed. The procedure will be scheduled electively. Orders: Colonoscopy (Colon)  Problem # 3:  GERD (ICD-530.81) Continue standard antireflux measures and omeprazole 20 mg q.a.m.  Patient Instructions: 1)  Please return your Hemoccults cards. 2)  Colonoscopybrochure given.  3)  Copy sent to : Windle Guard, MD 4)  The medication list was reviewed and reconciled.  All changed / newly prescribed medications were explained.  A complete medication list was provided to the patient / caregiver.  Prescriptions: MOVIPREP 100 GM  SOLR (PEG-KCL-NACL-NASULF-NA ASC-C) As per prep instructions.  #1 x 0   Entered by:   Christie Nottingham CMA (AAMA)   Authorized by:   Meryl Dare MD [facility]   Signed by:   Christie Nottingham CMA (AAMA) on 07/07/2009   Method used:   Electronically to        [facility]ail[facility]       [address]       [place], [place]  [address]       Ph: [phone]       Fax: [phone]   RxID:   [id number]

## 2010-05-26 NOTE — Letter (Signed)
[facility]: [facility]   Imported By: Sherian Rein 06/09/2009 14:04:21  _____________________________________________________________________  External Attachment:    Type:   Image     Comment:   External Document

## 2010-05-26 NOTE — Assessment & Plan Note (Signed)
Summary: 6 MTH FU  STC   Vital Signs:  Patient profile:   55 year old female Height:      64 inches Weight:      184.50 pounds BMI:     31.78 O2 Sat:      96 % on Room air Temp:     97.1 degrees F oral Pulse rate:   72 / minute BP sitting:   134 / 82  (left arm) Cuff size:   regular  Vitals Entered By: Margaret Pyle, CMA (January 19, 2010 3:50 PM)  O2 Flow:  Room air CC: 6 mth follow up   Primary Care Karron Alvizo:  Newt Lukes MD  CC:  6 mth follow up.  History of Present Illness: here for f/u   anxiety - feels underlying palpitations and nervousness with stress improved with citalopram- changed to venlafax by gyn to help with hot flash symptoms (instead of hormone tx) feels happier and calmer, but "could still do better" no adv SE meds noted -  no SI or tearfulness  GERD - sees GI for same - reports compliance with ongoing medical treatment and no changes in medication dose or frequency. denies adverse side effects related to current therapy.   HTN - reports compliance with ongoing medical treatment and no changes in medication dose or frequency. denies adverse side effects related to current therapy.   obesity with hx "preDM" - has lost over 30# intentionally with diet and exercise efforts -  slight weight gain noted in past 6 months - pt attributes to diet changes, poor food choices     Clinical Review Panels:  CBC   WBC:  5.1 (06/08/2009)   RBC:  4.35 (06/08/2009)   Hgb:  13.3 (06/08/2009)   Hct:  39.6 (06/08/2009)   Platelets:  281.0 (06/08/2009)   MCV  90.9 (06/08/2009)   MCHC  33.6 (06/08/2009)   RDW  14.3 (06/08/2009)   PMN:  53.7 (06/08/2009)   Lymphs:  35.3 (06/08/2009)   Monos:  4.8 (06/08/2009)   Eosinophils:  2.1 (06/08/2009)   Basophil:  4.1 (06/08/2009)  Complete Metabolic Panel   Glucose:  83 (06/08/2009)   Sodium:  139 (06/08/2009)   Potassium:  3.7 (06/08/2009)   Chloride:  102 (06/08/2009)   CO2:  30 (06/08/2009)  BUN:  13 (06/08/2009)   Creatinine:  0.8 (06/08/2009)   Albumin:  3.9 (06/08/2009)   Total Protein:  7.5 (06/08/2009)   Calcium:  9.4 (06/08/2009)   Total Bili:  0.4 (06/08/2009)   Alk Phos:  80 (06/08/2009)   SGPT (ALT):  16 (06/08/2009)   SGOT (AST):  18 (06/08/2009)   Current Medications (verified): 1)  Metoprolol Succinate 100 Mg Xr24h-Tab (Metoprolol Succinate) .Marland Kitchen.. 1 By Mouth Once Daily 2)  Omeprazole 20 Mg Cpdr (Omeprazole) .... One Tablet By Mouth Every Morning 3)  Citalopram Hydrobromide 20 Mg Tabs (Citalopram Hydrobromide) .Marland Kitchen.. 1 By Mouth Once Daily 4)  Venlafaxine Hcl 75 Mg Xr24h-Cap (Venlafaxine Hcl) .Marland Kitchen.. 1 By Mouth Once Daily  Allergies (verified): 1)  ! * Seafood  Past History:  Past Medical History: stuttering,  hearing defic hx PRE-DIABETES Anxiety Disorder Arthritis Chronic Headaches Diverticulosis   Acute Hepatitis B 1983 Hypertension Obesity  MD roster: GI - stark pulm - byrum gyn - henley  Review of Systems  The patient denies anorexia, fever, weight loss, chest pain, syncope, and abdominal pain.         c/o restless legs at night - cramping and "need to move  them all night"  Physical Exam  General:  overweight-appearing.  alert, well-developed, well-nourished, and cooperative to examination.   well dressed, improved stutter with conversation Lungs:  normal respiratory effort, no intercostal retractions or use of accessory muscles; normal breath sounds bilaterally - no crackles and no wheezes.    Heart:  normal rate, regular rhythm, no murmur, and no rub. BLE without edema.  Psych:  Oriented X3, memory intact for recent and remote, normally interactive, good eye contact, mildly anxious appearing, not depressed appearing, and not agitated.      Impression & Recommendations:  Problem # 1:  HYPERTENSION (ICD-401.9)  Her updated medication list for this problem includes:    Metoprolol Succinate 100 Mg Xr24h-tab (Metoprolol succinate) .Marland Kitchen... 1 by  mouth once daily  BP today: 134/82 Prior BP: 132/90 (10/08/2009)  Labs Reviewed: K+: 3.7 (06/08/2009) Creat: : 0.8 (06/08/2009)     Problem # 2:  RESTLESS LEG SYNDROME (ICD-333.94) classic symptoms - no anemia 05/2009 labs - ?iron defc - plan to check next OV - discussed med options - pt wishes to try "natural" tx 1st - rec tonic water for quinine tx call if not improved to consider requip or mirapex  Problem # 3:  ANXIETY (ICD-300.00)  titrate up venlafax as begun by gyn in place of citalopram  The following medications were removed from the medication list:    Citalopram Hydrobromide 20 Mg Tabs (Citalopram hydrobromide) .Marland Kitchen... 1 by mouth once daily Her updated medication list for this problem includes:    Venlafaxine Hcl 150 Mg Xr24h-tab (Venlafaxine hcl) .Marland Kitchen... 1 by mouth once daily  Orders: Prescription Created Electronically [id number])  Complete Medication List: 1)  Metoprolol Succinate 100 Mg Xr24h-tab (Metoprolol succinate) .Marland Kitchen.. 1 by mouth once daily 2)  Omeprazole 20 Mg Cpdr (Omeprazole) .... One tablet by mouth every morning 3)  Venlafaxine Hcl 150 Mg Xr24h-tab (Venlafaxine hcl) .Marland Kitchen.. 1 by mouth once daily  Other Orders: Admin 1st Vaccine (98119) Flu Vaccine 84yrs + (14782) Flu Vaccine Consent Questions     Do you have a history of severe allergic reactions to this vaccine? no    Any prior history of allergic reactions to egg and/or gelatin? no    Do you have a sensitivity to the preservative Thimersol? no    Do you have a past history of Guillan-Barre Syndrome? no    Do you currently have an acute febrile illness? no    Have you ever had a severe reaction to latex? no    Vaccine information given and explained to patient? yes    Are you currently pregnant? no    Lot Number:[id number]   Exp Date:10/22/2010   Site Given  Left Deltoid IM1st Vaccine (95621) Flu Vaccine 60yrs + (30865)  Patient Instructions: 1)  it was good to see you today. 2)  increase the venlafaxine  dose next month as discussed for anxiety symptoms and hot flashes 3)  try 8oz tonic water at bedtime to help with leg cramps - if this does not help, call us and we can try medications as needed  4)  Please schedule a follow-up appointment in 6 months, sooner if problems.  Prescriptions: METOPROLOL SUCCINATE 100 MG XR24H-TAB (METOPROLOL SUCCINATE) 1 by mouth once daily  #30 x 6   Entered and Authorized by:   Newt Lukes MD   Signed by:   Newt Lukes MD on 01/19/2010   Method used:   Electronically to        [facility] ([facility]ail)       [address]       [place], [place]  [address]       Ph: [phone]       Fax: [phone]   RxID:   [id number] VENLAFAXINE HCL 150 MG XR24H-TAB (VENLAFAXINE HCL) 1 by mouth once daily  #30 x 6   Entered and Authorized by:   Newt Lukes MD   Signed by:   Newt Lukes MD on 01/19/2010   Method used:   Electronically to        [facility] (retail)       [address]       [place], [place]  [address]       Ph: [phone]       Fax: [phone]   RxID:   [id number]   .lbflu

## 2010-05-26 NOTE — Assessment & Plan Note (Signed)
Summary: dyspnea, restrictive lung disease   Visit Type:  Initial Consult Primary Provider/Referring Provider:  Dr. Windle Guard  CC:  Pulmonary consult for abnormal PFT.   Pt c/o increased sob in the mornings and some with exertion.[facility]  History of Present Illness: 55 yo woman, never smoker, hx "pre-DM", HTN. Beginning about 2 months ago she began to have some exertional SOB. Has had trouble walking across a parking lot. No wheeze, no cough. No CP. Of note she estimates has had about 30 - 40 lbs wt gain over 3 - 4 years. Has had TTE and PFT's to evaluate as below. Has dropped about 20 lbs and the SOB has improved.   Preventive Screening-Counseling & Management  Alcohol-Tobacco     Smoking Status: quit  Current Medications (verified): 1)  Metoprolol Tartrate 100 Mg Tabs (Metoprolol Tartrate) .Marland Kitchen.. 1 By Mouth Daily 2)  Junel Fe 1/20 1-20 Mg-Mcg Tabs (Norethin Ace-Eth Estrad-Fe) .Marland Kitchen.. 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  Patient states never smoker.  Has worked before in [employer] for [employer] with her husband and sonSmoking Status:  quit  Review of Systems       The patient complains of shortness of breath with activity, shortness of breath at rest, chest pain, anxiety, and joint stiffness or pain.  The patient denies productive cough, non-productive cough, coughing up blood, irregular heartbeats, acid heartburn, indigestion, loss of appetite, weight change, abdominal pain, difficulty swallowing, sore throat, tooth/dental problems, headaches, nasal congestion/difficulty breathing through  nose, sneezing, itching, ear ache, depression, hand/feet swelling, rash, change in color of mucus, and fever.    Vital Signs:  Patient profile:   55 year old female Height:      64 inches (162.56 cm) Weight:      190.50 pounds (86.59 kg) BMI:     32.82 O2 Sat:      99 % on Room air Temp:     98.0 degrees F (36.67 degrees C) oral Pulse rate:   74 / minute BP sitting:   120 / 78  (left arm) Cuff size:   regular  Vitals Entered By: Michel Bickers CMA (January 13, 2009 2:02 PM)  O2 Flow:  Room air  Serial Vital Signs/Assessments:  Comments: 3:06 PM Ambulatory Pulse Oximetry  Resting; HR__73___    02 Sat_99____  Lap1 (185 feet)   HR_91____   02 Sat__98___ Lap2 (185 feet)   HR__93___   02 Sat__97___    Lap3 (185 feet)   HR__92___   02 Sat__98___  _x__Test Completed without Difficulty ___Test Stopped due to:  By: Michel Bickers CMA    Physical Exam  General:  very pleasant woman, NAD on RA. Mild stutter.  Head:  normocephalic and atraumatic Eyes:  conjunctiva and sclera clear Nose:  no deformity, discharge, inflammation, or lesions Mouth:  no deformity or lesions Neck:  no masses, thyromegaly, or abnormal cervical nodes Lungs:  clear bilaterally to auscultation and percussion Heart:  regular rate and rhythm, S1, S2 without murmurs, rubs, gallops, or clicks Abdomen:  not examined Msk:  no deformity or scoliosis noted with normal posture Extremities:  no clubbing, cyanosis, edema, or deformity noted Neurologic:  Stutter in  conversation, but easily understood. Non-focal exam.  Skin:  intact without lesions or rashes Psych:  alert and cooperative; normal mood and affect; normal attention span and concentration   Echocardiogram  Procedure date:  10/05/2008  Findings:      1. Left ventricle: The cavity size was normal. Wall thickness was        increased in a pattern of mild LVH. The estimated ejection        fraction was 65%. Wall motion was normal; there were no  regional        wall motion abnormalities.     2. Aortic valve: Mildly thickened leaflets. Cusp separation was        normal.     3. Mitral valve:        There is mild thickening of the mitral leaflets. There is trace        MR.     4. Left atrium: The atrium was mildly dilated.  Left ventricle: The cavity size was normal. Wall thickness was     increased in a pattern of mild LVH. The estimated ejection fraction     was 65%. Wall motion was normal; there were no regional wall motion     abnormalities.     Aortic valve: Mildly thickened leaflets. Cusp separation was normal.     Mitral valve:     There is mild thickening of the mitral leaflets. There is trace MR.     Doppler:   Peak gradient: 2mm Hg (D).     Left atrium: The atrium was mildly dilated.     Right ventricle: The cavity size was normal. Systolic function was     normal.     Pulmonic valve: The valve appears to be grossly normal.     Tricuspid valve: Structurally normal valve. Leaflet separation was     normal. Doppler: Transvalvular velocity was within the normal range.     No regurgitation.     Right atrium: The atrium was at the upper limits of normal in size.     Pericardium: There was no pericardial effusion.  X-ray  Procedure date:  01/13/2009  Findings:      CXR Comparison: none   Findings: Normal heart size shape.  Subtle peribronchial thickening.  No infiltrate.  No evidence for interstitial lung disease or airspace opacities.   IMPRESSION: Suggestion of minimal peribronchial thickening.   Read By:  Jonne Ply,  M.D.     Released By:  Jonne Ply,  M.D.  Pulmonary Function Test Date: 10/13/2008 Height (in.): 64 Gender: Female  Pre-Spirometry FVC    Value: 2.49 L/min   Pred: 3.21 L/min     % Pred: 78 % FEV1    Value: 2.15 L     Pred: 2.41 L     % Pred: 89 % FEV1/FVC  Value: 86 %     Pred: 81 %     % Pred: - % FEF 25-75  Value: 2.61 L/min   Pred: 2.77 L/min     % Pred: 94 %  Lung  Volumes TLC    Value: 3.86 L   % Pred: 76 % RV    Value: 0.58 L   % Pred: 59 %  Comments: Spirometry (10/13/08) shows probable restriction. Restriction confirmed on lung volumes performed on 11/10/08.   Impression & Recommendations:  Problem # 1:  DYSPNEA (ICD-786.05) PFT's and films are reassuring. No pulm HTN or LV dysfxn on TTE. I reassurred her that  I believe her restriction and dyspnea are related to her wt gain and to some deconditioning. She is beginning to feel better after losing a portion of the wt.  - encouraged her to continue her exercise regimen - r/o occult exertional hypoxemia today  - f/u as needed.   Medications Added to Medication List This Visit: 1)  Metoprolol Tartrate 100 Mg Tabs (Metoprolol tartrate) .Marland Kitchen.. 1 by mouth daily 2)  Junel Fe 1/20 1-20 Mg-mcg Tabs (Norethin ace-eth estrad-fe) .Marland Kitchen.. 1 by mouth daily  Other Orders: T-2 View CXR (71020TC) Consultation Level IV ([address])  Patient Instructions: 1)  We will perform walking oximetry today 2)  We will recheck your CXR today. Dr Delton Coombes will call you with the results.  3)  Follow up with Dr Delton Coombes as needed.

## 2010-05-26 NOTE — Letter (Signed)
Summary: New Patient letter  [facility]  [address] [place], [place] [address]   Phone: [phone]  Fax: [phone]       05/14/2009 MRN: [id number]  [facility] [address] [place], [place]  [address]  Dear Ms. Joaquin,  Welcome to the Gastroenterology Division at [facility].    You are scheduled to see Dr.  Claudette Head on June 08, 2009 at 3:00pm on the 3rd floor at [facility], [address]  We ask that you try to arrive at our office 15 minutes prior to your appointment time to allow for check-in.  We would like you to complete the enclosed self-administered evaluation form prior to your visit and bring it with you on the day of your appointment.  We will review it with you.  Also, please bring a complete list of all your medications or, if you prefer, bring the medication bottles and we will list them.  Please bring your insurance card so that we may make a copy of it.  If your insurance requires a referral to see a specialist, please bring your referral form from your primary care physician.  Co-payments are due at the time of your visit and may be paid by cash, check or credit card.     Your office visit will consist of a consult with your physician (includes a physical exam), any laboratory testing he/she may order, scheduling of any necessary diagnostic testing (e.g. x-ray, ultrasound, CT-scan), and scheduling of a procedure (e.g. Endoscopy, Colonoscopy) if required.  Please allow enough time on your schedule to allow for any/all of these possibilities.    If you cannot keep your appointment, please call [phone] to cancel or reschedule prior to your appointment date.  This allows [place] the opportunity to schedule an appointment for another patient in need of care.  If you do not cancel or reschedule by 5 p.m. the business day prior to your appointment date, you will be charged a $50.00 late cancellation/no-show fee.     Thank you for choosing  Gastroenterology for your medical needs.  We appreciate the opportunity to care for you.  Please visit [place] at our website  to learn more about our practice.                     Sincerely,                                                             The Gastroenterology Division

## 2010-05-26 NOTE — Progress Notes (Signed)
Summary: Moviprep medication  Phone Note Call from Patient Call back at [phone]   Caller: Patient Call For: Dr. Russella Dar Reason for Call: Talk to Nurse Summary of Call: Pt would like her Moviprep sent to [facility]. Initial call taken by: Karna Christmas,  July 21, 2009 10:08 AM  Follow-up for Phone Call        Rx was sent to pts pharmacy.  Follow-up by: Christie Nottingham CMA Duncan Dull),  July 21, 2009 10:15 AM    Prescriptions: MOVIPREP 100 GM  SOLR (PEG-KCL-NACL-NASULF-NA ASC-C) As per prep instructions.  #1 x 0   Entered by:   Christie Nottingham CMA (AAMA)   Authorized by:   Meryl Dare MD [facility]   Signed by:   Christie Nottingham CMA Duncan Dull) on 07/21/2009   Method used:   Electronically to        [facility] [id number][facility] (retail)       [address]       [place], [place]  [address]       Ph: [phone]       Fax: [phone]   RxID:   [id number]

## 2010-05-26 NOTE — Assessment & Plan Note (Signed)
Summary: anxiety/cd   Vital Signs:  Patient profile:   55 year old female Height:      64 inches (162.56 cm) Weight:      180.8 pounds (82.18 kg) O2 Sat:      98 % on Room air Temp:     98.0 degrees F (36.67 degrees C) oral Pulse rate:   66 / minute BP sitting:   132 / 84  (left arm) Cuff size:   regular  Vitals Entered By: Orlan Leavens (Sep 10, 2009 9:24 AM)  O2 Flow:  Room air CC: Anxiety, also ? spider bite (L) foot Is Patient Diabetic? No Pain Assessment Patient in pain? no        Primary Care Provider:  Newt Lukes MD  CC:  Anxiety and also ? spider bite (L) foot.  History of Present Illness: c/o anxiety - feels underlying palpitations and nervousness with stress - +increased stress at work and family symptoms worse at night or when "pressured" at work/home +hx same - on meds before but unsure what type- no SI or tearfulness  noted swelling and tenderness over outside of left ankle onset pain 3 days ago - ?insect bite 4 days ago while walking outside - recalls sting feeling in this area at that time - no redness but increased swelling no fever, no open skin sore - no itch  prior OV reviewed 07/22/09  1) concern about finger tip numbness and discoloration - symptoms occur in any fingers, not always same one or same pattern - symptoms worse with cold weather symptoms a/w "pins and needle feeling" - episodes last few minutes to 30 minutes per episode - no fever, no pain a/w color change to blue/black  2) requests PAP and refill on OCP - reports med rx by PCP to keep from having hot flashes - not having periods -  3) GERD - sees GI for same - reports compliance with ongoing medical treatment and no changes in medication dose or frequency. denies adverse side effects related to current therapy.   4) HTN - reports compliance with ongoing medical treatment and no changes in medication dose or frequency. denies adverse side effects related to current  therapy.   5) obesity with hx "preDM" - has lost over 30# intentionally with diet and exercise efforts -      Current Medications (verified): 1)  Metoprolol Tartrate 100 Mg Tabs (Metoprolol Tartrate) .Marland Kitchen.. 1 By Mouth Daily 2)  Junel Fe 1/20 1-20 Mg-Mcg Tabs (Norethin Ace-Eth Estrad-Fe) .Marland Kitchen.. 1 By Mouth Daily 3)  Omeprazole 20 Mg Cpdr (Omeprazole) .... One Tablet By Mouth Every Morning  Allergies (verified): 1)  ! * Seafood  Past History:  Past Medical History: stuttering,  hearing defic hx PRE-DIABETES Anxiety Disorder Arthritis Chronic Headaches Diverticulosis Acute Hepatitis B 1983 Hypertension Obesity  MD rooster: GI - stark pulm - byrum  Review of Systems  The patient denies chest pain, headaches, abdominal pain, and depression.    Physical Exam  General:  overweight-appearing.  alert, well-developed, well-nourished, and cooperative to examination.   well dressed, marked stutter with conversation Lungs:  normal respiratory effort, no intercostal retractions or use of accessory muscles; normal breath sounds bilaterally - no crackles and no wheezes.    Heart:  normal rate, regular rhythm, no murmur, and no rub. BLE without edema. normal DP pulses and normal cap refill in all 4 extremities    Msk:  left ankle FROM without pain, no effusions Skin:  swelling over small  nodule from insect bite over lateral mallelous on left ankle, mild cellulitis reaction around this (3 cm circumfrence) Psych:  Oriented X3, memory intact for recent and remote, normally interactive, good eye contact, mildly anxious appearing, not depressed appearing, and not agitated.      Impression & Recommendations:  Problem # 1:  CELLULITIS, ANKLE, LEFT (ICD-682.6)  Her updated medication list for this problem includes:    Septra Ds 800-160 Mg Tabs (Sulfamethoxazole-trimethoprim) .Marland Kitchen... 1 by mouth two times a day x 5 day  Elevate affected area. Warm moist compresses for 20 minutes every 2 hours  while awake. Take antibiotics as directed and take acetaminophen as needed. To be seen in 48-72 hours if no improvement, sooner if worse.  Orders: Prescription Created Electronically [phone])  Problem # 2:  ANXIETY (ICD-300.00)  Her updated medication list for this problem includes:    Citalopram Hydrobromide 20 Mg Tabs (Citalopram hydrobromide) .Marland Kitchen... 1 by mouth once daily  Discussed medication use and relaxation techniques.   Orders: Prescription Created Electronically [phone])  Complete Medication List: 1)  Metoprolol Succinate 100 Mg Xr24h-tab (Metoprolol succinate) .Marland Kitchen.. 1 by mouth once daily 2)  Omeprazole 20 Mg Cpdr (Omeprazole) .... One tablet by mouth every morning 3)  Citalopram Hydrobromide 20 Mg Tabs (Citalopram hydrobromide) .Marland Kitchen.. 1 by mouth once daily 4)  Septra Ds 800-160 Mg Tabs (Sulfamethoxazole-trimethoprim) .Marland Kitchen.. 1 by mouth two times a day x 5 day  Patient Instructions: 1)  it was good to see you today. 2)  antibiotic for your ankle skin infection -  3)  start citalopram for anxiety symptoms  4)  your prescriptions have been electronically submitted to your pharmacy. Please take as directed. Contact our office if you believe you're having problems with the medication(s).  5)  will not refill birth control pills - you need to followup with gynecology on this issue - call if you need a referral 6)  Please schedule a follow-up appointment in 4 weeks to review anxiety symptoms and medications, sooner if problems.  Prescriptions: OMEPRAZOLE 20 MG CPDR (OMEPRAZOLE) one tablet by mouth every morning  #30 x 5   Entered and Authorized by:   Newt Lukes MD   Signed by:   Newt Lukes MD on 09/10/2009   Method used:   Electronically to        [facility] (retail)       [address]       [place], [place]  [address]       Ph: [phone]       Fax: [phone]   RxID:   [id number] METOPROLOL SUCCINATE 100 MG XR24H-TAB  (METOPROLOL SUCCINATE) 1 by mouth once daily  #30 x 5   Entered and Authorized by:   Newt Lukes MD   Signed by:   Newt Lukes MD on 09/10/2009   Method used:   Electronically to        [facility] (retail)       [address]       [place], [place]  [address]       Ph: [phone]       Fax: [phone]   RxID:   [id number] SEPTRA DS 800-160 MG TABS (SULFAMETHOXAZOLE-TRIMETHOPRIM) 1 by mouth two times a day x 5 day  #10 x 0   Entered and Authorized by:   Newt Lukes MD  Signed by:   Newt Lukes MD on 09/10/2009   Method used:   Electronically to        [facility] (retail)       [address]       [place], [place]  [address]       Ph: [phone]       Fax: [phone]   RxID:   [id number] CITALOPRAM HYDROBROMIDE 20 MG TABS (CITALOPRAM HYDROBROMIDE) 1 by mouth once daily  #30 x 2   Entered and Authorized by:   Newt Lukes MD   Signed by:   Newt Lukes MD on 09/10/2009   Method used:   Electronically to        [facility] (retail)       [address]       [place], [place]  [address]       Ph: [phone]       Fax: [phone]   RxID:   [id number]

## 2010-05-26 NOTE — Assessment & Plan Note (Signed)
Summary: 4 WK FU  STC   Vital Signs:  Patient profile:   55 year old female Height:      64 inches (162.56 cm) Weight:      179.0 pounds (81.36 kg) O2 Sat:      96 % on Room air Temp:     97.2 degrees F (36.22 degrees C) oral Pulse rate:   67 / minute BP sitting:   132 / 90  (left arm) Cuff size:   regular  Vitals Entered By: Orlan Leavens (October 08, 2009 4:10 PM)  O2 Flow:  Room air CC: 4 week f/u Is Patient Diabetic? No Pain Assessment Patient in pain? no        Primary Care Provider:  Newt Lukes MD  CC:  4 week f/u.  History of Present Illness: f/u anxiety - feels underlying palpitations and nervousness with stress have improved with citalopram- feels happier and calmer no adv SE medds noted -  no SI or tearfulness   prior OV reviewed 07/22/09  1) concern about finger tip numbness and discoloration - symptoms occur in any fingers, not always same one or same pattern - symptoms worse with cold weather symptoms a/w "pins and needle feeling" - episodes last few minutes to 30 minutes per episode - no fever, no pain a/w color change to blue/black  2) requests PAP and refill on OCP - reports med rx by PCP to keep from having hot flashes - not having periods -  3) GERD - sees GI for same - reports compliance with ongoing medical treatment and no changes in medication dose or frequency. denies adverse side effects related to current therapy.   4) HTN - reports compliance with ongoing medical treatment and no changes in medication dose or frequency. denies adverse side effects related to current therapy.   5) obesity with hx "preDM" - has lost over 30# intentionally with diet and exercise efforts -      Clinical Review Panels:  Prevention   Last Mammogram:  [facility].   No specific mammographic evidence of malignancy.  Assessment: BIRADS 1. (09/30/2008)   Last Colonoscopy:  DONE (07/23/2009)  CBC   WBC:  5.1  (06/08/2009)   RBC:  4.35 (06/08/2009)   Hgb:  13.3 (06/08/2009)   Hct:  39.6 (06/08/2009)   Platelets:  281.0 (06/08/2009)   MCV  90.9 (06/08/2009)   MCHC  33.6 (06/08/2009)   RDW  14.3 (06/08/2009)   PMN:  53.7 (06/08/2009)   Lymphs:  35.3 (06/08/2009)   Monos:  4.8 (06/08/2009)   Eosinophils:  2.1 (06/08/2009)   Basophil:  4.1 (06/08/2009)  Complete Metabolic Panel   Glucose:  83 (06/08/2009)   Sodium:  139 (06/08/2009)   Potassium:  3.7 (06/08/2009)   Chloride:  102 (06/08/2009)   CO2:  30 (06/08/2009)   BUN:  13 (06/08/2009)   Creatinine:  0.8 (06/08/2009)   Albumin:  3.9 (06/08/2009)   Total Protein:  7.5 (06/08/2009)   Calcium:  9.4 (06/08/2009)   Total Bili:  0.4 (06/08/2009)   Alk Phos:  80 (06/08/2009)   SGPT (ALT):  16 (06/08/2009)   SGOT (AST):  18 (06/08/2009)   Current Medications (verified): 1)  Metoprolol Succinate 100 Mg Xr24h-Tab (Metoprolol Succinate) .Marland Kitchen.. 1 By Mouth Once Daily 2)  Omeprazole 20 Mg Cpdr (Omeprazole) .... One Tablet By Mouth Every Morning 3)  Citalopram Hydrobromide 20 Mg Tabs (Citalopram Hydrobromide) .Marland Kitchen.. 1 By Mouth Once Daily  Allergies (verified): 1)  ! *  Seafood  Past History:  Past Medical History: stuttering,  hearing defic hx PRE-DIABETES Anxiety Disorder Arthritis Chronic Headaches Diverticulosis Acute Hepatitis B 1983 Hypertension Obesity  MD roster: GI - stark pulm - byrum  Social History: [place] farm.  Patient states never smoker. rare alcohol Has worked before in [employer] with her husband and son, 16yo + dog   Review of Systems  The patient denies chest pain, syncope, dyspnea on exertion, abdominal pain, depression, and abnormal bleeding.    Physical Exam  General:  overweight-appearing.  alert, well-developed, well-nourished, and cooperative to examination.   well dressed, improved stutter with conversation Lungs:  normal respiratory effort, no  intercostal retractions or use of accessory muscles; normal breath sounds bilaterally - no crackles and no wheezes.    Heart:  normal rate, regular rhythm, no murmur, and no rub. BLE without edema.    Impression & Recommendations:  Problem # 1:  ANXIETY (ICD-300.00) Assessment Improved cont same - much improved Her updated medication list for this problem includes:    Citalopram Hydrobromide 20 Mg Tabs (Citalopram hydrobromide) .Marland Kitchen... 1 by mouth once daily  Problem # 2:  POSTMENOPAUSAL STATUS (ICD-V49.81)  again advised i would not refill BCP given age and FH "clots" - to f/u with gyn as needed   Complete Medication List: 1)  Metoprolol Succinate 100 Mg Xr24h-tab (Metoprolol succinate) .Marland Kitchen.. 1 by mouth once daily 2)  Omeprazole 20 Mg Cpdr (Omeprazole) .... One tablet by mouth every morning 3)  Citalopram Hydrobromide 20 Mg Tabs (Citalopram hydrobromide) .Marland Kitchen.. 1 by mouth once daily  Patient Instructions: 1)  it was good to see you today. 2)  continue same dose citalopram for anxiety symptoms - refills done 3)  will not refill birth control pills - you need to followup with gynecology on this issue - call if you need a referral 4)  Please schedule a follow-up appointment in 3-6 months to review anxiety symptoms and medications, sooner if problems.  Prescriptions: CITALOPRAM HYDROBROMIDE 20 MG TABS (CITALOPRAM HYDROBROMIDE) 1 by mouth once daily  #30 x 6   Entered and Authorized by:   Newt Lukes MD   Signed by:   Newt Lukes MD on 10/08/2009   Method used:   Electronically to        [facility] (retail)       [address]       [place], [place]  [address]       Ph: [phone]       Fax: [phone]   RxID:   [id number]

## 2010-05-26 NOTE — Letter (Signed)
Summary: [facility] Consult Scheduled Letter  [facility]  [address] [place], [place] [address]   Phone: [phone]  Fax: [phone]      07/26/2009 MRN: [id number]  [facility] [address] [place], [place]  [address]    Dear Ms. Disbro,      We have scheduled an appointment for you.  At the recommendation of Dr.Leschber, we have scheduled you a consult with Dr Henderson Cloud on 08/03/09 at 10:15am.  Their phone number is [phone].  If this appointment day and time is not convenient for you, please feel free to call the office of the doctor you are being referred to at the number listed above and reschedule the appointment.     [facility] [address] [place], [place] [address]    Thank you,  Patient Care Coordinator [facility]

## 2010-07-10 LAB — DIFFERENTIAL
Basophils Absolute: 0 10*3/uL (ref 0.0–0.1)
Basophils Relative: 1 % (ref 0–1)
Eosinophils Absolute: 0.1 10*3/uL (ref 0.0–0.7)
Eosinophils Relative: 2 % (ref 0–5)
Lymphocytes Relative: 35 % (ref 12–46)
Lymphs Abs: 2.2 10*3/uL (ref 0.7–4.0)
Monocytes Absolute: 0.5 10*3/uL (ref 0.1–1.0)
Monocytes Relative: 8 % (ref 3–12)
Neutro Abs: 3.4 10*3/uL (ref 1.7–7.7)
Neutrophils Relative %: 55 % (ref 43–77)

## 2010-07-10 LAB — POCT I-STAT, CHEM 8
BUN: 9 mg/dL (ref 6–23)
Calcium, Ion: 1.15 mmol/L (ref 1.12–1.32)
Chloride: 108 mEq/L (ref 96–112)
Creatinine, Ser: 0.8 mg/dL (ref 0.4–1.2)
Glucose, Bld: 82 mg/dL (ref 70–99)
HCT: 40 % (ref 36.0–46.0)
Hemoglobin: 13.6 g/dL (ref 12.0–15.0)
Potassium: 3.9 mEq/L (ref 3.5–5.1)
Sodium: 139 mEq/L (ref 135–145)
TCO2: 25 mmol/L (ref 0–100)

## 2010-07-10 LAB — CBC
HCT: 37.9 % (ref 36.0–46.0)
Hemoglobin: 13.1 g/dL (ref 12.0–15.0)
MCHC: 34.4 g/dL (ref 30.0–36.0)
MCV: 90 fL (ref 78.0–100.0)
Platelets: 309 10*3/uL (ref 150–400)
RBC: 4.21 MIL/uL (ref 3.87–5.11)
RDW: 14.8 % (ref 11.5–15.5)
WBC: 6.2 10*3/uL (ref 4.0–10.5)

## 2010-07-13 ENCOUNTER — Encounter: Payer: Self-pay | Admitting: *Deleted

## 2010-07-20 ENCOUNTER — Ambulatory Visit (INDEPENDENT_AMBULATORY_CARE_PROVIDER_SITE_OTHER): Payer: BC Managed Care – PPO | Admitting: Internal Medicine

## 2010-07-20 ENCOUNTER — Encounter: Payer: Self-pay | Admitting: Internal Medicine

## 2010-07-20 ENCOUNTER — Other Ambulatory Visit (INDEPENDENT_AMBULATORY_CARE_PROVIDER_SITE_OTHER): Payer: BC Managed Care – PPO

## 2010-07-20 DIAGNOSIS — E669 Obesity, unspecified: Secondary | ICD-10-CM

## 2010-07-20 DIAGNOSIS — R7309 Other abnormal glucose: Secondary | ICD-10-CM

## 2010-07-20 DIAGNOSIS — Z0000 Encounter for general adult medical examination without abnormal findings: Secondary | ICD-10-CM

## 2010-07-20 DIAGNOSIS — I10 Essential (primary) hypertension: Secondary | ICD-10-CM

## 2010-07-20 LAB — CBC WITH DIFFERENTIAL/PLATELET
Basophils Absolute: 0 10*3/uL (ref 0.0–0.1)
Basophils Relative: 0.4 % (ref 0.0–3.0)
Eosinophils Absolute: 0.2 10*3/uL (ref 0.0–0.7)
Eosinophils Relative: 3.2 % (ref 0.0–5.0)
HCT: 35.4 % — ABNORMAL LOW (ref 36.0–46.0)
Hemoglobin: 12.1 g/dL (ref 12.0–15.0)
Lymphocytes Relative: 33 % (ref 12.0–46.0)
Lymphs Abs: 1.8 10*3/uL (ref 0.7–4.0)
MCHC: 34.1 g/dL (ref 30.0–36.0)
MCV: 88.3 fl (ref 78.0–100.0)
Monocytes Absolute: 0.4 10*3/uL (ref 0.1–1.0)
Monocytes Relative: 7.1 % (ref 3.0–12.0)
Neutro Abs: 3 10*3/uL (ref 1.4–7.7)
Neutrophils Relative %: 56.3 % (ref 43.0–77.0)
Platelets: 252 10*3/uL (ref 150.0–400.0)
RBC: 4.01 Mil/uL (ref 3.87–5.11)
RDW: 14.7 % — ABNORMAL HIGH (ref 11.5–14.6)
WBC: 5.3 10*3/uL (ref 4.5–10.5)

## 2010-07-20 LAB — LIPID PANEL
Cholesterol: 150 mg/dL (ref 0–200)
HDL: 55 mg/dL (ref >39.00)
LDL Cholesterol: 80 mg/dL (ref 0–99)
Total CHOL/HDL Ratio: 3
Triglycerides: 77 mg/dL (ref 0.0–149.0)
VLDL: 15.4 mg/dL (ref 0.0–40.0)

## 2010-07-20 LAB — URINALYSIS
Bilirubin Urine: NEGATIVE
Ketones, ur: NEGATIVE
Leukocytes, UA: NEGATIVE
Nitrite: NEGATIVE
Specific Gravity, Urine: >=1.03 (ref 1.000–1.030)
Total Protein, Urine: NEGATIVE
Urine Glucose: NEGATIVE
Urobilinogen, UA: 0.2 (ref 0.0–1.0)
pH: 5.5 (ref 5.0–8.0)

## 2010-07-20 LAB — HEPATIC FUNCTION PANEL
ALT: 20 U/L (ref 0–35)
AST: 21 U/L (ref 0–37)
Albumin: 3.6 g/dL (ref 3.5–5.2)
Alkaline Phosphatase: 102 U/L (ref 39–117)
Bilirubin, Direct: 0 mg/dL (ref 0.0–0.3)
Total Bilirubin: 0 mg/dL — ABNORMAL LOW (ref 0.3–1.2)
Total Protein: 7 g/dL (ref 6.0–8.3)

## 2010-07-20 LAB — BASIC METABOLIC PANEL
BUN: 13 mg/dL (ref 6–23)
CO2: 31 mEq/L (ref 19–32)
Calcium: 9.1 mg/dL (ref 8.4–10.5)
Chloride: 106 mEq/L (ref 96–112)
Creatinine, Ser: 0.8 mg/dL (ref 0.4–1.2)
GFR: 91.22 mL/min (ref >60.00)
Glucose, Bld: 99 mg/dL (ref 70–99)
Potassium: 4.4 mEq/L (ref 3.5–5.1)
Sodium: 142 mEq/L (ref 135–145)

## 2010-07-20 LAB — HEMOGLOBIN A1C: Hgb A1c MFr Bld: 6.2 % (ref 4.6–6.5)

## 2010-07-20 LAB — TSH: TSH: 2.09 u[IU]/mL (ref 0.35–5.50)

## 2010-07-20 NOTE — Progress Notes (Signed)
Subjective:    Patient ID: Suzanne [name], female    DOB: 1955/07/22, 55 y.o.   MRN: [id number]  HPI  patient is here today for annual physical. Patient feels well and has no complaints.  Also reviewed chronic med issues: anxiety - feels underlying palpitations and nervousness with stress improved with citalopram- changed to venlafax by gyn to help with hot flash symptoms (instead of hormone tx) feels happier and calmer, but "could still do better"; no adv SE meds noted -  no SI or tearfulness  GERD - sees GI for same - reports compliance with ongoing medical treatment and no changes in medication dose or frequency. denies adverse side effects related to current therapy.   HTN - reports compliance with ongoing medical treatment and no changes in medication dose or frequency. denies adverse side effects related to current therapy.   obesity with hx "preDM" - had lost over 30# intentionally with diet and exercise efforts -  weight gain noted in past 9 months - pt attributes to diet changes, poor food choices  Past Medical History  Diagnosis Date  . OBESITY 07/21/2009  . ANXIETY 09/10/2009  . HYPERTENSION 01/13/2009  . 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 GERD 07/07/2009  . ABDOMINAL PAIN OTHER SPECIFIED SITE 06/08/2009  . PRE-DIABETES 01/13/2009  . Acute hepatitis B 04/24/1981   Family History  Problem Relation Age of Onset  . Lung cancer Father   . Allergies Brother   . Allergies Son   . Lung cancer Maternal Aunt   . Colon cancer Maternal Aunt   . Clotting disorder Other    History  Substance Use Topics  . Smoking status: Never Smoker   . Smokeless tobacco: Not on file  . Alcohol Use: Yes    Review of Systems  Constitutional: Negative for fever.  Respiratory: Negative for cough and shortness of breath.   Cardiovascular: Negative for chest pain.  Gastrointestinal: Negative for abdominal pain.  Musculoskeletal: Negative for gait problem.  Skin: Negative for rash.    Neurological: Negative for dizziness.  No other specific complaints in a complete review of systems (except as listed in HPI above).     Objective:   Physical Exam BP 122/72  Pulse 69  Temp(Src) 98 F (36.7 C) (Oral)  Ht 5\' 8"  (1.727 m)  Wt 203 lb (92.08 kg)  BMI 30.87 kg/m2 Physical Exam  Constitutional: She is oriented to person, place, and time. She appears well-developed and well-nourished. No distress.  Eyes: Conjunctivae and EOM are normal. Pupils are equal, round, and reactive to light. No scleral icterus.  Neck: Normal range of motion. Neck supple. No JVD present. No thyromegaly present.  Cardiovascular: Normal rate, regular rhythm and normal heart sounds.  No murmur heard. Pulmonary/Chest: Effort normal and breath sounds normal. No respiratory distress. She has no wheezes.  Abdominal: Soft. Bowel sounds are normal. She exhibits no distension. There is no tenderness.  Musculoskeletal: Normal range of motion. She exhibits no edema.  Neurological: She is alert and oriented to person, place, and time. No cranial nerve deficit. Coordination normal.  Skin: Skin is warm and dry. No rash noted. No erythema.  Psychiatric: She has a normal mood and affect. Her behavior is normal. Judgment and thought content normal.      Wt Readings from Last 3 Encounters:  07/20/10 203 lb (92.08 kg)  01/19/10 184 lb 8 oz (83.689 kg)  10/08/09 179 lb (81.194 kg)   Lab Results  Component Value Date   WBC 5.1  06/08/2009   HGB 13.3 06/08/2009   HCT 39.6 06/08/2009   PLT 281.0 06/08/2009   ALT 16 06/08/2009   AST 18 06/08/2009   NA 139 06/08/2009   K 3.7 06/08/2009   CL 102 06/08/2009   CREATININE 0.8 06/08/2009   BUN 13 06/08/2009   CO2 30 06/08/2009   TSH 0.95 06/08/2009     Assessment & Plan:  See problem list. Medications and labs reviewed today.

## 2010-07-20 NOTE — Assessment & Plan Note (Signed)
The current medical regimen is effective;  continue present plan and medications. BP Readings from Last 3 Encounters:  07/20/10 122/72  01/19/10 134/82  10/08/09 132/90

## 2010-07-20 NOTE — Assessment & Plan Note (Signed)
Discussed need for lifestyle changes - reck weight 3 mo Wt Readings from Last 3 Encounters:  07/20/10 203 lb (92.08 kg)  01/19/10 184 lb 8 oz (83.689 kg)  10/08/09 179 lb (81.194 kg)

## 2010-07-20 NOTE — Assessment & Plan Note (Addendum)
Weight gain reviewed - check a1c and work on diet/exercise/wt loss  Addendum: Will change dx to dm2, diet controlled based on a1c results: Lab Results  Component Value Date   HGBA1C 6.2 07/20/2010

## 2010-07-20 NOTE — Assessment & Plan Note (Signed)
Patient has been counseled on age-appropriate routine health concerns for screening and prevention. These are reviewed and up-to-date. Immunizations are up-to-date or declined. Labs ordered and will be reviewed.

## 2010-07-20 NOTE — Patient Instructions (Signed)
It was good to see you today. Work on lifestyle changes as discussed (low fat, low carb diet diet; improved exercise efforts; weight loss) to control sugar, blood pressure and cholesterol levels and/or reduce risk of developing other medical problems. Test(s) ordered today. Your results will be called to you after review (48-72hours after test completion). If any changes need to be made, you will be notified at that time. Medications reviewed, no changes at this time. Please schedule followup in 3-4 months for weight check, call sooner if problems.

## 2010-07-21 ENCOUNTER — Telehealth: Payer: Self-pay | Admitting: Internal Medicine

## 2010-07-21 NOTE — Telephone Encounter (Signed)
Please call patient - results show diet controlled DM (a1c 6.2, needs to be <6 to be nonDM). No medication changes recommended, but needs to work on diet and weight loss as we discussed.  All other cpx labs good. Thanks.

## 2010-07-21 NOTE — Telephone Encounter (Signed)
Pt Notified with lab results & md recommendations.Marland KitchenMarland Kitchen3/29/12@12 :02pm/LMB

## 2010-10-04 ENCOUNTER — Other Ambulatory Visit: Payer: Self-pay | Admitting: Internal Medicine

## 2010-10-19 ENCOUNTER — Ambulatory Visit: Payer: BC Managed Care – PPO | Admitting: Internal Medicine

## 2010-11-10 ENCOUNTER — Other Ambulatory Visit: Payer: Self-pay | Admitting: Internal Medicine

## 2010-11-23 ENCOUNTER — Encounter: Payer: Self-pay | Admitting: Internal Medicine

## 2010-11-23 ENCOUNTER — Ambulatory Visit (INDEPENDENT_AMBULATORY_CARE_PROVIDER_SITE_OTHER): Payer: BC Managed Care – PPO | Admitting: Internal Medicine

## 2010-11-23 DIAGNOSIS — I10 Essential (primary) hypertension: Secondary | ICD-10-CM

## 2010-11-23 DIAGNOSIS — F411 Generalized anxiety disorder: Secondary | ICD-10-CM

## 2010-11-23 DIAGNOSIS — F419 Anxiety disorder, unspecified: Secondary | ICD-10-CM | POA: Insufficient documentation

## 2010-11-23 MED ORDER — VENLAFAXINE HCL ER 150 MG PO CP24: 150.0000 mg | ORAL_CAPSULE | Freq: Every day | ORAL | Status: DC

## 2010-11-23 NOTE — Progress Notes (Signed)
  Subjective:    Patient ID: Suzanne [name], female    DOB: Mar 23, 1956, 55 y.o.   MRN: [id number]  HPI  Here for follow up - reviewed chronic med issues:  complains of anxiety - exacerbated by family stressors and work manifest with underlying palpitations and nervousness with stress Previously felt improved with citalopram - changed to venlafax by gyn to help with hot flash symptoms (instead of hormone tx) Pt resumed citalopram with ongoing venlfax no SI or tearfulness  GERD - sees GI for same - reports compliance with ongoing medical treatment and no changes in medication dose or frequency. denies adverse side effects related to current therapy.   HTN - reports compliance with ongoing medical treatment and no changes in medication dose or frequency. denies adverse side effects related to current therapy.   obesity with hx "preDM" - had lost over 30# intentionally with diet and exercise efforts -  weight gain noted - pt attributes to diet changes, poor food choices with stress  Past Medical History  Diagnosis Date  . Anxiety   . OBESITY   . RAYNAUDS SYNDROME   . PRE-DIABETES   . HYPERTENSION   . GERD     Review of Systems  Constitutional: Negative for fever. +weight gain Respiratory: Negative for cough.   Cardiovascular: Negative for chest pain.  Neurological: Negative for dizziness.     Objective:   Physical Exam  BP 140/80  Pulse 64  Temp(Src) 97.9 F (36.6 C) (Oral)  Ht 5\' 4"  (1.626 m)  Wt 211 lb 12.8 oz (96.072 kg)  BMI 36.36 kg/m2  SpO2 99%  Constitutional: She has chronic stutter; obese; oriented to person, place, and time. She appears well-developed and well-nourished. No distress.  Cardiovascular: Normal rate, regular rhythm and normal heart sounds.  No murmur heard. no BLE edema Pulmonary/Chest: Effort normal and breath sounds normal. No respiratory distress. She has no wheezes.  Neurological: She is alert and oriented to person, place, and time.  No cranial nerve deficit. Coordination normal.  Psychiatric: She has a normal mood and affect. Her behavior is normal. Judgment and thought content normal.      Wt Readings from Last 3 Encounters:  11/23/10 211 lb 12.8 oz (96.072 kg)  07/20/10 203 lb (92.08 kg)  01/19/10 184 lb 8 oz (83.689 kg)   Lab Results  Component Value Date   WBC 5.3 07/20/2010   HGB 12.1 07/20/2010   HCT 35.4* 07/20/2010   PLT 252.0 07/20/2010   CHOL 150 07/20/2010   TRIG 77.0 07/20/2010   HDL 55.00 07/20/2010   ALT 20 07/20/2010   AST 21 07/20/2010   NA 142 07/20/2010   K 4.4 07/20/2010   CL 106 07/20/2010   CREATININE 0.8 07/20/2010   BUN 13 07/20/2010   CO2 31 07/20/2010   TSH 2.09 07/20/2010   HGBA1C 6.2 07/20/2010     Assessment & Plan:  See problem list. Medications and labs reviewed today.

## 2010-11-23 NOTE — Assessment & Plan Note (Signed)
The current medical regimen is generally effective (not taken yet today);  continue present plan and medications. Also work on diet/exercise for weight control  BP Readings from Last 3 Encounters:  11/23/10 140/80  07/20/10 122/72  01/19/10 134/82

## 2010-11-23 NOTE — Assessment & Plan Note (Signed)
Increase symptoms - pt indep resumed citalopram with venlfax - Will stop SSRI and increase SNRI - new erx done Offered referral to counseling for support - pt declines at this time

## 2010-11-23 NOTE — Patient Instructions (Addendum)
It was good to see you today. Stop citalopram and increase dose of venlafaxine - Your prescription(s) have been submitted to your pharmacy. Please take as directed and contact our office if you believe you are having problem(s) with the medication(s). If you feel counseling or therapy would help, let us know AND I will make a referral as needed Other medications reviewed, no changes at this time.  Work on lifestyle changes as discussed (low fat, low carb, increased protein diet; improved exercise efforts; weight loss) to control sugar, blood pressure and cholesterol levels and/or reduce risk of developing other medical problems. Look into [email or web address] or other type of food journal to assist you in this process. Please schedule followup in 3 months for mood check and blood pressure, call sooner if problems.

## 2011-01-13 ENCOUNTER — Other Ambulatory Visit: Payer: Self-pay | Admitting: Internal Medicine

## 2011-02-13 ENCOUNTER — Encounter: Payer: Self-pay | Admitting: Internal Medicine

## 2011-02-13 ENCOUNTER — Ambulatory Visit (INDEPENDENT_AMBULATORY_CARE_PROVIDER_SITE_OTHER): Payer: BC Managed Care – PPO | Admitting: Internal Medicine

## 2011-02-13 VITALS — BP 142/72 | HR 83 | Temp 98.7°F | Ht 64.0 in | Wt 212.2 lb

## 2011-02-13 DIAGNOSIS — B353 Tinea pedis: Secondary | ICD-10-CM

## 2011-02-13 MED ORDER — NYSTATIN 100000 UNIT/GM EX CREA: TOPICAL_CREAM | Freq: Two times a day (BID) | CUTANEOUS | Status: DC

## 2011-02-13 NOTE — Progress Notes (Signed)
  Subjective:    Patient ID: Suzanne [name], female    DOB: Jul 15, 1955, 55 y.o.   MRN: [id number]  HPI  complains of "splitting" skin on left 4th and 5th toes Onset 2 months ago - getting "worse" Mild itch but no pain or drainage No trauma or swelling -   Past Medical History  Diagnosis Date  . Anxiety   . OBESITY   . RAYNAUDS SYNDROME   . PRE-DIABETES   . HYPERTENSION   . GERD     Review of Systems  Constitutional: Negative for fever.  Cardiovascular: Negative for leg swelling.  Musculoskeletal: Negative for joint swelling and gait problem.       Objective:   Physical Exam BP 142/72  Pulse 83  Temp(Src) 98.7 F (37.1 C) (Oral)  Ht 5\' 4"  (1.626 m)  Wt 212 lb 3.2 oz (96.253 kg)  BMI 36.42 kg/m2  SpO2 97% Wt Readings from Last 3 Encounters:  02/13/11 212 lb 3.2 oz (96.253 kg)  11/23/10 211 lb 12.8 oz (96.072 kg)  07/20/10 203 lb (92.08 kg)   Constitutional: She is overweight but appears well-developed and well-nourished. No distress.  Cardiovascular: Normal rate, regular rhythm and normal heart sounds.  No murmur heard. No BLE edema. Pulmonary/Chest: Effort normal and breath sounds normal. No respiratory distress. She has no wheezes. Musculoskeletal: Normal range of motion, no joint effusions. No gross deformities Skin: Fungal changes between 4/5 toe web space on left foot with maceration/cracking - no cellulitis - Skin is warm and dry. No rash noted. No erythema.  Psychiatric: She has a normal mood and affect. Her behavior is normal. Judgment and thought content normal.   Lab Results  Component Value Date   WBC 5.3 07/20/2010   HGB 12.1 07/20/2010   HCT 35.4* 07/20/2010   PLT 252.0 07/20/2010   GLUCOSE 99 07/20/2010   CHOL 150 07/20/2010   TRIG 77.0 07/20/2010   HDL 55.00 07/20/2010   LDLCALC 80 07/20/2010   ALT 20 07/20/2010   AST 21 07/20/2010   NA 142 07/20/2010   K 4.4 07/20/2010   CL 106 07/20/2010   CREATININE 0.8 07/20/2010   BUN 13 07/20/2010   CO2 31  07/20/2010   TSH 2.09 07/20/2010   HGBA1C 6.2 07/20/2010       Assessment & Plan:  Foot fungus - tx antifungal cream - education provided on same

## 2011-02-13 NOTE — Patient Instructions (Signed)
It was good to see you today. Use nystatin cream on your affected toes/skin - Your prescription(s) have been submitted to your pharmacy. Please take as directed and contact our office if you believe you are having problem(s) with the medication(s). Please schedule followup in 6 weeks for weight check and blood work, call sooner if problems. Fungus Infection of the Skin An infection of your skin caused by a fungus is a very common problem. Treatment depends on which part of the body is affected. Types of fungal skin infection include:  Athlete's Foot(Tinea pedis). This infection starts between the toes and may involve the entire sole and sides of foot. It is the most common fungal disease. It is made worse by heat, moisture, and friction. To treat, wash your feet 2 to 3 times daily. Dry thoroughly between the toes. Use medicated foot powder or cream as directed on the package. Plain talc, cornstarch, or rice powder may be dusted into socks and shoes to keep the feet dry. Wearing footwear that allows ventilation is also helpful.     Ringworm (Tinea corporis and tinea capitis). This infection causes scaly red rings to form on the skin or scalp. For skin sores, apply medicated lotion or cream as directed on the package. For the scalp, medicated shampoo may be used with with other therapies. Ringworm of the scalp or fingernails usually requires using oral medicine for 2 to 4 months.     Tinea versicolor. This infection appears as painless, scaly, patchy areas of discolored skin (whitish to light brown). It is more common in the summer and favors oily areas of the skin such as those found at the chest, abdomen, back, pubis, neck, and body folds. It can be treated with medicated shampoo or with medicated topical cream. Oral antifungals may be needed for more active infections. The light and/or dark spots may take time to get better and is not a sign of treatment failure.  Fungal infections may need to be treated  for several weeks to be cured. It is important not to treat fungal infections with steroids or combination medicine that contains an antifungal and steroid as these will make the fungal infection worse. SEEK MEDICAL CARE IF:    You have persistent itching or rawness.     You have an oral temperature above 102 F (38.9 C).  Document Released: 05/18/2004 Document Revised: 12/21/2010 Document Reviewed: 08/03/2009 [facility] Patient Information 2012 [place], [place].

## 2011-03-27 ENCOUNTER — Ambulatory Visit (INDEPENDENT_AMBULATORY_CARE_PROVIDER_SITE_OTHER): Payer: BC Managed Care – PPO | Admitting: Internal Medicine

## 2011-03-27 ENCOUNTER — Other Ambulatory Visit (INDEPENDENT_AMBULATORY_CARE_PROVIDER_SITE_OTHER): Payer: BC Managed Care – PPO

## 2011-03-27 ENCOUNTER — Encounter: Payer: Self-pay | Admitting: Internal Medicine

## 2011-03-27 DIAGNOSIS — F419 Anxiety disorder, unspecified: Secondary | ICD-10-CM

## 2011-03-27 DIAGNOSIS — R635 Abnormal weight gain: Secondary | ICD-10-CM

## 2011-03-27 DIAGNOSIS — E119 Type 2 diabetes mellitus without complications: Secondary | ICD-10-CM

## 2011-03-27 DIAGNOSIS — I10 Essential (primary) hypertension: Secondary | ICD-10-CM

## 2011-03-27 DIAGNOSIS — F411 Generalized anxiety disorder: Secondary | ICD-10-CM

## 2011-03-27 LAB — TSH: TSH: 1.14 u[IU]/mL (ref 0.35–5.50)

## 2011-03-27 LAB — HEMOGLOBIN A1C: Hgb A1c MFr Bld: 6 % (ref 4.6–6.5)

## 2011-03-27 NOTE — Progress Notes (Signed)
  Subjective:    Patient ID: Suzanne [name], female    DOB: May 29, 1955, 55 y.o.   MRN: [id number]  HPI  Here for follow up - reviewed chronic med issues:  Chronic anxiety - exacerbated by family stressors and work manifest with underlying palpitations and nervousness with stress Previously felt improved with citalopram - changed to venlafax by gyn to help with hot flash symptoms (instead of hormone tx) no SI or tearfulness  GERD - sees GI for same - reports compliance with ongoing medical treatment and no changes in medication dose or frequency. denies adverse side effects related to current therapy.   HTN - reports compliance with ongoing medical treatment and no changes in medication dose or frequency. denies adverse side effects related to current therapy.   obesity with hx "preDM" - had lost over 30# intentionally with diet and exercise efforts -  weight gain noted - pt attributes to diet changes, poor food choices with stress  Past Medical History  Diagnosis Date  . Anxiety   . OBESITY   . RAYNAUDS SYNDROME   . PRE-DIABETES   . HYPERTENSION   . GERD     Review of Systems  Constitutional: Negative for fever. +weight gain Respiratory: Negative for cough.   Cardiovascular: Negative for chest pain.  Neurological: Negative for dizziness.     Objective:   Physical Exam  BP 130/80  Pulse 77  Temp(Src) 97.9 F (36.6 C) (Oral)  Wt 213 lb 9.6 oz (96.888 kg)  SpO2 99% Wt Readings from Last 3 Encounters:  03/27/11 213 lb 9.6 oz (96.888 kg)  02/13/11 212 lb 3.2 oz (96.253 kg)  11/23/10 211 lb 12.8 oz (96.072 kg)   Constitutional: She has chronic stutter; obese; but appears well-developed and well-nourished. No distress.  Cardiovascular: Normal rate, regular rhythm and normal heart sounds.  No murmur heard. no BLE edema Pulmonary/Chest: Effort normal and breath sounds normal. No respiratory distress. She has no wheezes.  Psychiatric: She has a normal mood and  affect. Her behavior is normal. Judgment and thought content normal.       Lab Results  Component Value Date   WBC 5.3 07/20/2010   HGB 12.1 07/20/2010   HCT 35.4* 07/20/2010   PLT 252.0 07/20/2010   CHOL 150 07/20/2010   TRIG 77.0 07/20/2010   HDL 55.00 07/20/2010   ALT 20 07/20/2010   AST 21 07/20/2010   NA 142 07/20/2010   K 4.4 07/20/2010   CL 106 07/20/2010   CREATININE 0.8 07/20/2010   BUN 13 07/20/2010   CO2 31 07/20/2010   TSH 2.09 07/20/2010   HGBA1C 6.2 07/20/2010     Assessment & Plan:  See problem list. Medications and labs reviewed today.

## 2011-03-27 NOTE — Assessment & Plan Note (Signed)
Weight gain reviewed - continue to work on diet/exercise/wt loss dm2, diet controlled ? Will recheck a1c today and consider meds as needed  Lab Results  Component Value Date   HGBA1C 6.2 07/20/2010

## 2011-03-27 NOTE — Patient Instructions (Signed)
It was good to see you today. Test(s) ordered today. Your results will be called to you after review (48-72hours after test completion). If any changes need to be made, you will be notified at that time. Please schedule followup in 3-4 months for diabetes mellitus and weight check, call sooner if problems.

## 2011-03-27 NOTE — Assessment & Plan Note (Signed)
The current medical regimen is generally effective;  continue present plan and medications. Also work on diet/exercise for weight control  BP Readings from Last 3 Encounters:  03/27/11 130/80  02/13/11 142/72  11/23/10 140/80

## 2011-03-27 NOTE — Assessment & Plan Note (Signed)
Previously on citalopram  Changed to venlafex for hot flash and increased SNRI dose 11/2010 symptoms stable - continue same without change Offered referral to counseling for support - pt declines at this time

## 2011-04-07 ENCOUNTER — Other Ambulatory Visit: Payer: Self-pay | Admitting: Internal Medicine

## 2011-04-14 ENCOUNTER — Other Ambulatory Visit: Payer: Self-pay

## 2011-04-14 MED ORDER — VENLAFAXINE HCL ER 150 MG PO CP24: 150.0000 mg | ORAL_CAPSULE | Freq: Every day | ORAL | Status: DC

## 2011-04-27 ENCOUNTER — Encounter: Payer: Self-pay | Admitting: Internal Medicine

## 2011-04-27 ENCOUNTER — Ambulatory Visit (INDEPENDENT_AMBULATORY_CARE_PROVIDER_SITE_OTHER): Payer: BC Managed Care – PPO | Admitting: Internal Medicine

## 2011-04-27 VITALS — BP 130/92 | HR 68 | Temp 97.6°F | Wt 216.8 lb

## 2011-04-27 DIAGNOSIS — M79669 Pain in unspecified lower leg: Secondary | ICD-10-CM

## 2011-04-27 DIAGNOSIS — S8010XA Contusion of unspecified lower leg, initial encounter: Secondary | ICD-10-CM

## 2011-04-27 DIAGNOSIS — M79609 Pain in unspecified limb: Secondary | ICD-10-CM

## 2011-04-27 MED ORDER — HYDROCODONE-ACETAMINOPHEN 5-500 MG PO TABS: 1.0000 | ORAL_TABLET | Freq: Three times a day (TID) | ORAL | Status: AC | PRN

## 2011-04-27 NOTE — Progress Notes (Signed)
  Subjective:    Patient ID: Suzanne [name], female    DOB: [birth year], 56 y.o.   MRN: [id number]  HPI Complains of pain in bilateral shins right greater than left side Onset greater than one week ago Precipitated by accidental fall on December 25 Patient describes slipping on porch while carrying tray Patient fell forward landing on her shins against the edge of the table Mild bruising on front of right shin Describes pain as 3/10-5/10 with "ache and throb" No knee pain/swelling or ankle pain or swelling Patient has not used over-the-counter analgesics or ice  Past Medical History  Diagnosis Date  . Anxiety   . OBESITY   . RAYNAUDS SYNDROME   . PRE-DIABETES   . HYPERTENSION   . GERD     Review of Systems  Neurological: Negative for seizures, syncope, weakness and numbness.       Objective:   Physical Exam BP 130/92  Pulse 68  Temp(Src) 97.6 F (36.4 C) (Oral)  Wt 216 lb 12.8 oz (98.34 kg)  SpO2 97% General: No acute distress Muscle skeletal: No gross deformities. Bilateral knees with full range of motion, no effusion, nontender. Small hematoma over front of right shin, mildly tender to palpation but full weightbearing without pain - ankles without swelling or pain to palpation, full range of motion and ligamentous function intact      Assessment & Plan:  R shin pain "bone bruise" following accidental trauma >1 week ago - reassurance provided - vicodin to use for nighttime pain symptoms - also encouraged to Alternate between ibuprofen and tylenol for aches and pain symptoms + ice 3x/day

## 2011-04-27 NOTE — Patient Instructions (Signed)
It was good to see you today. Your shin pain is due to a hematoma - also known as a bone bruise - no fracture or broken bones Use Vicodin at night for pain symptoms as needed - Your prescription(s) have been submitted to your pharmacy. Please take as directed and contact our office if you believe you are having problem(s) with the medication(s). The need to continue putting ice on this area 3 times a day for the next one week and use Aleve or Tylenol in the a.m. for pain prevention

## 2011-05-19 ENCOUNTER — Other Ambulatory Visit: Payer: Self-pay | Admitting: Internal Medicine

## 2011-06-26 ENCOUNTER — Ambulatory Visit (INDEPENDENT_AMBULATORY_CARE_PROVIDER_SITE_OTHER): Payer: BC Managed Care – PPO | Admitting: Internal Medicine

## 2011-06-26 ENCOUNTER — Encounter: Payer: Self-pay | Admitting: Internal Medicine

## 2011-06-26 VITALS — BP 130/72 | HR 79 | Temp 97.5°F | Ht 63.0 in | Wt 218.8 lb

## 2011-06-26 DIAGNOSIS — Z23 Encounter for immunization: Secondary | ICD-10-CM

## 2011-06-26 DIAGNOSIS — E119 Type 2 diabetes mellitus without complications: Secondary | ICD-10-CM

## 2011-06-26 DIAGNOSIS — I10 Essential (primary) hypertension: Secondary | ICD-10-CM

## 2011-06-26 DIAGNOSIS — Z0000 Encounter for general adult medical examination without abnormal findings: Secondary | ICD-10-CM

## 2011-06-26 DIAGNOSIS — E669 Obesity, unspecified: Secondary | ICD-10-CM

## 2011-06-26 DIAGNOSIS — Z1239 Encounter for other screening for malignant neoplasm of breast: Secondary | ICD-10-CM

## 2011-06-26 NOTE — Progress Notes (Signed)
Subjective:    Patient ID: Suzanne [name], female    DOB: 03-28-1956, 56 y.o.   MRN: [id number]  HPI  patient is here today for annual physical. Patient feels well and has no complaints.  Also reviewed chronic medical issues:  Chronic anxiety - exacerbated by family stressors and work manifest with underlying palpitations and nervousness with stress Previously felt improved with citalopram - then changed to venlafax by gyn 2011 to help with hot flash symptoms (instead of hormone tx); no SI or tearfulness  GERD - sees GI for same - reports compliance with ongoing medical treatment and no changes in medication dose or frequency. denies adverse side effects related to current therapy.   HTN - reports compliance with ongoing medical treatment and no changes in medication dose or frequency. denies adverse side effects related to current therapy.   obesity with hx "preDM" - had lost over 30# intentionally with diet and exercise efforts in 2009, but  weight gain noted - pt attributes to diet changes, poor food choices with stress  Past Medical History  Diagnosis Date  . Anxiety   . OBESITY   . RAYNAUDS SYNDROME   . PRE-DIABETES   . HYPERTENSION   . GERD    Family History  Problem Relation Age of Onset  . Lung cancer Father   . Allergies Brother   . Allergies Son   . Lung cancer Maternal Aunt   . Colon cancer Maternal Aunt   . Clotting disorder Other    History  Substance Use Topics  . Smoking status: Never Smoker   . Smokeless tobacco: Not on file  . Alcohol Use: Yes    Review of Systems Constitutional: Negative for fever or unexpected weight change.  Respiratory: Negative for cough and shortness of breath.   Cardiovascular: Negative for chest pain or palpitations.  Gastrointestinal: Negative for abdominal pain, no bowel changes.  Musculoskeletal: Negative for gait problem or joint swelling.  Skin: Negative for rash.  Neurological: Negative for dizziness or  headache.  No other specific complaints in a complete review of systems (except as listed in HPI above).     Objective:   Physical Exam  BP 130/72  Pulse 79  Temp(Src) 97.5 F (36.4 C) (Oral)  Ht 5\' 3"  (1.6 m)  Wt 218 lb 12.8 oz (99.247 kg)  BMI 38.76 kg/m2  SpO2 97% Wt Readings from Last 3 Encounters:  06/26/11 218 lb 12.8 oz (99.247 kg)  04/27/11 216 lb 12.8 oz (98.34 kg)  03/27/11 213 lb 9.6 oz (96.888 kg)   Constitutional: She has chronic stutter; obese; but appears well-developed and well-nourished. No distress.  HENT: Head: Normocephalic and atraumatic. Ears: B TMs ok, no erythema or effusion; Nose: Nose normal. Mouth/Throat: Oropharynx is clear and moist. No oropharyngeal exudate.  Eyes: Conjunctivae and EOM are normal. Pupils are equal, round, and reactive to light. No scleral icterus.  Neck: Normal range of motion. Neck supple. No JVD present. No thyromegaly present.  Cardiovascular: Normal rate, regular rhythm and normal heart sounds.  No murmur heard. No BLE edema. Pulmonary/Chest: Effort normal and breath sounds normal. No respiratory distress. She has no wheezes.  Abdominal: Soft. Bowel sounds are normal. She exhibits no distension. There is no tenderness. no masses Musculoskeletal: Normal range of motion, no joint effusions. No gross deformities Neurological: She is alert and oriented to person, place, and time. No cranial nerve deficit. Coordination normal.  Skin: Skin is warm and dry. No rash noted. No erythema.  Psychiatric:  She has a normal mood and affect. Her behavior is normal. Judgment and thought content normal.       Lab Results  Component Value Date   WBC 5.3 07/20/2010   HGB 12.1 07/20/2010   HCT 35.4* 07/20/2010   PLT 252.0 07/20/2010   CHOL 150 07/20/2010   TRIG 77.0 07/20/2010   HDL 55.00 07/20/2010   ALT 20 07/20/2010   AST 21 07/20/2010   NA 142 07/20/2010   K 4.4 07/20/2010   CL 106 07/20/2010   CREATININE 0.8 07/20/2010   BUN 13 07/20/2010   CO2 31  07/20/2010   TSH 1.14 03/27/2011   HGBA1C 6.0 03/27/2011     Assessment & Plan:  CPX - v70.0 - Patient has been counseled on age-appropriate routine health concerns for screening and prevention. These are reviewed and up-to-date. Immunizations are up-to-date or declined. Labs ordered and will be reviewed.  Also see problem list. Medications and labs reviewed today.

## 2011-06-26 NOTE — Patient Instructions (Signed)
It was good to see you today. Health Maintenance reviewed - urine microalbumin ordered, tetanus booster given, ordered mammogram screening.  Test(s) ordered today. Your results will be called to you after review (48-72hours after test completion). If any changes need to be made, you will be notified at that time. Medications reviewed, no changes at this time.  Please schedule followup in 4 months for diabetes mellitus and weight check, call sooner if problems.

## 2011-06-26 NOTE — Assessment & Plan Note (Signed)
Weight gain reviewed - continue to work on diet/exercise/wt loss dm2, diet controlled ? Will recheck a1c today and consider meds as needed  Lab Results  Component Value Date   HGBA1C 6.0 03/27/2011

## 2011-06-26 NOTE — Assessment & Plan Note (Signed)
Discussed need for lifestyle changes - reck weight 4 mo Wt Readings from Last 3 Encounters:  06/26/11 218 lb 12.8 oz (99.247 kg)  04/27/11 216 lb 12.8 oz (98.34 kg)  03/27/11 213 lb 9.6 oz (96.888 kg)

## 2011-06-26 NOTE — Assessment & Plan Note (Signed)
The current medical regimen is generally effective;  continue present plan and medications. Also work on diet/exercise for weight control  BP Readings from Last 3 Encounters:  06/26/11 130/72  04/27/11 130/92  03/27/11 130/80

## 2011-07-13 ENCOUNTER — Other Ambulatory Visit (INDEPENDENT_AMBULATORY_CARE_PROVIDER_SITE_OTHER): Payer: BC Managed Care – PPO

## 2011-07-13 ENCOUNTER — Other Ambulatory Visit: Payer: Self-pay | Admitting: *Deleted

## 2011-07-13 DIAGNOSIS — Z0000 Encounter for general adult medical examination without abnormal findings: Secondary | ICD-10-CM

## 2011-07-13 DIAGNOSIS — E119 Type 2 diabetes mellitus without complications: Secondary | ICD-10-CM

## 2011-07-13 LAB — CBC WITH DIFFERENTIAL/PLATELET
Basophils Absolute: 0 10*3/uL (ref 0.0–0.1)
Basophils Relative: 0.9 % (ref 0.0–3.0)
Eosinophils Absolute: 0.2 10*3/uL (ref 0.0–0.7)
Eosinophils Relative: 3 % (ref 0.0–5.0)
HCT: 37 % (ref 36.0–46.0)
Hemoglobin: 12.2 g/dL (ref 12.0–15.0)
Lymphocytes Relative: 22.4 % (ref 12.0–46.0)
Lymphs Abs: 1.2 10*3/uL (ref 0.7–4.0)
MCHC: 32.9 g/dL (ref 30.0–36.0)
MCV: 87.4 fl (ref 78.0–100.0)
Monocytes Absolute: 0.4 10*3/uL (ref 0.1–1.0)
Monocytes Relative: 7 % (ref 3.0–12.0)
Neutro Abs: 3.5 10*3/uL (ref 1.4–7.7)
Neutrophils Relative %: 66.7 % (ref 43.0–77.0)
Platelets: 268 10*3/uL (ref 150.0–400.0)
RBC: 4.24 Mil/uL (ref 3.87–5.11)
RDW: 15.3 % — ABNORMAL HIGH (ref 11.5–14.6)
WBC: 5.2 10*3/uL (ref 4.5–10.5)

## 2011-07-13 LAB — HEPATIC FUNCTION PANEL
ALT: 21 U/L (ref 0–35)
AST: 17 U/L (ref 0–37)
Albumin: 3.5 g/dL (ref 3.5–5.2)
Alkaline Phosphatase: 97 U/L (ref 39–117)
Bilirubin, Direct: 0.1 mg/dL (ref 0.0–0.3)
Total Bilirubin: 0.2 mg/dL — ABNORMAL LOW (ref 0.3–1.2)
Total Protein: 7 g/dL (ref 6.0–8.3)

## 2011-07-13 LAB — URINALYSIS
Bilirubin Urine: NEGATIVE
Hgb urine dipstick: NEGATIVE
Ketones, ur: NEGATIVE
Leukocytes, UA: NEGATIVE
Nitrite: NEGATIVE
Specific Gravity, Urine: 1.02 (ref 1.000–1.030)
Total Protein, Urine: NEGATIVE
Urine Glucose: NEGATIVE
Urobilinogen, UA: 0.2 (ref 0.0–1.0)
pH: 6.5 (ref 5.0–8.0)

## 2011-07-13 LAB — BASIC METABOLIC PANEL
BUN: 10 mg/dL (ref 6–23)
CO2: 31 mEq/L (ref 19–32)
Calcium: 8.8 mg/dL (ref 8.4–10.5)
Chloride: 103 mEq/L (ref 96–112)
Creatinine, Ser: 0.8 mg/dL (ref 0.4–1.2)
GFR: 100.63 mL/min (ref >60.00)
Glucose, Bld: 94 mg/dL (ref 70–99)
Potassium: 4.2 mEq/L (ref 3.5–5.1)
Sodium: 139 mEq/L (ref 135–145)

## 2011-07-13 LAB — LIPID PANEL
Cholesterol: 131 mg/dL (ref 0–200)
HDL: 43.7 mg/dL (ref >39.00)
LDL Cholesterol: 78 mg/dL (ref 0–99)
Total CHOL/HDL Ratio: 3
Triglycerides: 47 mg/dL (ref 0.0–149.0)
VLDL: 9.4 mg/dL (ref 0.0–40.0)

## 2011-07-13 LAB — TSH: TSH: 1.15 u[IU]/mL (ref 0.35–5.50)

## 2011-07-13 LAB — HEMOGLOBIN A1C: Hgb A1c MFr Bld: 6.2 % (ref 4.6–6.5)

## 2011-07-13 LAB — MICROALBUMIN / CREATININE URINE RATIO
Creatinine,U: 161.9 mg/dL
Microalb Creat Ratio: 0.2 mg/g (ref 0.0–30.0)
Microalb, Ur: 0.4 mg/dL (ref 0.0–1.9)

## 2011-07-13 MED ORDER — OMEPRAZOLE 20 MG PO CPDR: 20.0000 mg | DELAYED_RELEASE_CAPSULE | Freq: Every day | ORAL | Status: DC

## 2011-07-13 MED ORDER — VENLAFAXINE HCL ER 150 MG PO CP24: 150.0000 mg | ORAL_CAPSULE | Freq: Every day | ORAL | Status: DC

## 2011-07-13 MED ORDER — METOPROLOL SUCCINATE ER 100 MG PO TB24: 100.0000 mg | ORAL_TABLET | Freq: Every day | ORAL | Status: DC

## 2011-07-13 MED ORDER — PROMETHAZINE HCL 25 MG PO TABS: 25.0000 mg | ORAL_TABLET | Freq: Four times a day (QID) | ORAL | Status: AC | PRN

## 2011-07-13 NOTE — Telephone Encounter (Signed)
There is no nausea med on pt med list. Is it ok to send something for nausea.... 07/13/11@11 :50am/LMB

## 2011-07-13 NOTE — Telephone Encounter (Signed)
All meds has been sent to [facility] left msg on pt cell with info... 07/13/11@3 ;01pm/LMB

## 2011-07-13 NOTE — Telephone Encounter (Signed)
Yes - also rx for phenergan 25 prn done - thanks

## 2011-07-13 NOTE — Telephone Encounter (Signed)
Pt walk in requesting a 6 months supply on her all of her medications. She stated wabt a 6 month supply due to insurance will be ending on 07/21/11, and she will not have the money to pay for meds after that. Also pt states she would like refill on nausea med, but doesn't know name. Called pt back no answer [facility] can only send 90 day in at a time. Will send renewal to [facility].... 07/13/11@11 :45am/LMB

## 2011-07-17 ENCOUNTER — Ambulatory Visit
Admission: RE | Admit: 2011-07-17 | Discharge: 2011-07-17 | Disposition: A | Discharge status: Home / Self Care | Payer: BC Managed Care – PPO | Source: Ambulatory Visit | Attending: Internal Medicine | Admitting: Internal Medicine

## 2011-07-17 DIAGNOSIS — Z1239 Encounter for other screening for malignant neoplasm of breast: Secondary | ICD-10-CM

## 2011-08-29 ENCOUNTER — Encounter: Payer: Self-pay | Admitting: Internal Medicine

## 2011-08-29 ENCOUNTER — Ambulatory Visit (INDEPENDENT_AMBULATORY_CARE_PROVIDER_SITE_OTHER)
Admission: RE | Admit: 2011-08-29 | Discharge: 2011-08-29 | Disposition: A | Discharge status: Home / Self Care | Payer: BC Managed Care – PPO | Source: Ambulatory Visit | Attending: Internal Medicine | Admitting: Internal Medicine

## 2011-08-29 ENCOUNTER — Ambulatory Visit (INDEPENDENT_AMBULATORY_CARE_PROVIDER_SITE_OTHER): Payer: BC Managed Care – PPO | Admitting: Internal Medicine

## 2011-08-29 VITALS — BP 132/72 | HR 79 | Temp 98.3°F | Ht 63.0 in | Wt 213.0 lb

## 2011-08-29 DIAGNOSIS — W57XXXA Bitten or stung by nonvenomous insect and other nonvenomous arthropods, initial encounter: Secondary | ICD-10-CM

## 2011-08-29 DIAGNOSIS — M25539 Pain in unspecified wrist: Secondary | ICD-10-CM

## 2011-08-29 DIAGNOSIS — S60569A Insect bite (nonvenomous) of unspecified hand, initial encounter: Secondary | ICD-10-CM

## 2011-08-29 DIAGNOSIS — M25531 Pain in right wrist: Secondary | ICD-10-CM

## 2011-08-29 DIAGNOSIS — T148XXA Other injury of unspecified body region, initial encounter: Secondary | ICD-10-CM

## 2011-08-29 MED ORDER — TRIAMCINOLONE ACETONIDE 0.1 % EX CREA: TOPICAL_CREAM | Freq: Two times a day (BID) | CUTANEOUS | Status: DC

## 2011-08-29 MED ORDER — DICLOFENAC SODIUM 75 MG PO TBEC: 75.0000 mg | DELAYED_RELEASE_TABLET | Freq: Two times a day (BID) | ORAL | Status: DC

## 2011-08-29 NOTE — Patient Instructions (Signed)
It was good to see you today. Test(s) ordered today. Your results will be called to you after review (48-72hours after test completion). If any changes need to be made, you will be notified at that time. Use Diclofenac 2x/day for inflammation and apply cream as needed to wrist at bite - Your prescription(s) have been submitted to your pharmacy. Please take as directed and contact our office if you believe you are having problem(s) with the medication(s). If you develop worsening symptoms or fever, call and we can reconsider antibiotics, but it does not appear necessary to use antibiotics at this time. If pain or swelling persists despite these medications, call for re-evaluation

## 2011-08-29 NOTE — Progress Notes (Signed)
  Subjective:    Patient ID: Suzanne [name], female    DOB: [birth year], 56 y.o.   MRN: [id number]  HPI  complains of pain on right wrist, radial side Onset 3 weeks ago Precipitated by insect sting - denies injury or trauma No fever but gradual swelling and pain at site of sting No other joint pain or skin rash  Past Medical History  Diagnosis Date  . Anxiety   . OBESITY   . RAYNAUDS SYNDROME   . Diabetes mellitus, type 2   . HYPERTENSION   . GERD     Review of Systems  Constitutional: Negative for fever and fatigue.  Eyes: Positive for itching.  Respiratory: Positive for cough. Negative for shortness of breath and wheezing.   Musculoskeletal: Negative for joint swelling.       Objective:   Physical Exam BP 132/72  Pulse 79  Temp(Src) 98.3 F (36.8 C) (Oral)  Ht 5\' 3"  (1.6 m)  Wt 213 lb (96.616 kg)  BMI 37.73 kg/m2  SpO2 98% Constitutional: She is overweight, appears well-developed and well-nourished. No distress. stuttering without change Neck: Normal range of motion. Neck supple. No JVD or LAD present. No thyromegaly present.  Cardiovascular: Normal rate, regular rhythm and normal heart sounds.  No murmur heard. No BLE edema. Pulmonary/Chest: Effort normal and breath sounds normal. No respiratory distress. She has no wheezes.  Musculoskeletal: swelling and tender over R radial edge of wrist - Normal range of motion but painful flexion of wrist and extension of thumb, no joint effusions. No gross deformities Skin: Soft tissue swelling without erythema or induration. Skin is warm and dry. No rash noted. No erythema.  Psychiatric: She has a normal mood and affect. Her behavior is normal. Judgment and thought content normal.        Assessment & Plan:   R wrist pain - swelling with inflammation but no clear infection despite reported insect sting at this location - will check xray rule out fracture, tx wit NSAIDs, topical steroids but hold empiric antibiotics  - pt to call if worse or unimproved

## 2011-11-22 ENCOUNTER — Ambulatory Visit: Payer: BC Managed Care – PPO | Admitting: Internal Medicine

## 2011-12-20 ENCOUNTER — Encounter: Payer: Self-pay | Admitting: Internal Medicine

## 2011-12-20 ENCOUNTER — Other Ambulatory Visit (INDEPENDENT_AMBULATORY_CARE_PROVIDER_SITE_OTHER): Payer: BC Managed Care – PPO

## 2011-12-20 ENCOUNTER — Ambulatory Visit (INDEPENDENT_AMBULATORY_CARE_PROVIDER_SITE_OTHER): Payer: BC Managed Care – PPO | Admitting: Internal Medicine

## 2011-12-20 VITALS — BP 130/78 | HR 73 | Temp 97.9°F | Ht 64.0 in | Wt 217.8 lb

## 2011-12-20 DIAGNOSIS — M25539 Pain in unspecified wrist: Secondary | ICD-10-CM

## 2011-12-20 DIAGNOSIS — I10 Essential (primary) hypertension: Secondary | ICD-10-CM

## 2011-12-20 DIAGNOSIS — E119 Type 2 diabetes mellitus without complications: Secondary | ICD-10-CM

## 2011-12-20 DIAGNOSIS — E669 Obesity, unspecified: Secondary | ICD-10-CM

## 2011-12-20 DIAGNOSIS — M25531 Pain in right wrist: Secondary | ICD-10-CM

## 2011-12-20 LAB — HEMOGLOBIN A1C: Hgb A1c MFr Bld: 6 % (ref 4.6–6.5)

## 2011-12-20 NOTE — Patient Instructions (Signed)
It was good to see you today. We have reviewed your prior records including labs and tests today Test(s) ordered today. Your results will be called to you after review (48-72hours after test completion). If any changes need to be made, you will be notified at that time. Continue to use Diclofenac 2x/day for wrist pain and wear wrist splint until you are seen by hand specialist we'll make referral to hand specialist for your wrist pain. Our office will contact you regarding appointment(s) once made. Please schedule followup in 4-6 months for weight, blood pressure and diabetes mellitus check, call sooner if problems.

## 2011-12-20 NOTE — Assessment & Plan Note (Signed)
Weight gain reviewed - continue to work on diet/exercise/wt loss dm2, diet controlled  Will recheck a1c today and consider meds as needed  Lab Results  Component Value Date   HGBA1C 6.2 07/13/2011

## 2011-12-20 NOTE — Progress Notes (Signed)
Subjective:    Patient ID: Suzanne [name], female    DOB: 14-Oct-1955, 56 y.o.   MRN: [id number]  HPI  Continued R wrist pain Onset 07/2011 -?Precipitated by insect sting - denies other injury or trauma Exacerbated by pronation effort No fever but gradual swelling and pain  No other joint pain or skin rash  Also reviewed chronic medical issues:  Chronic anxiety - exacerbated by family stressors and work manifest with underlying palpitations and nervousness with stress Previously felt improved with citalopram - then changed to venlafax by gyn 2011 to help with hot flash symptoms (instead of hormone tx); no SI or tearfulness  GERD - sees GI for same - reports compliance with ongoing medical treatment and no changes in medication dose or frequency. denies adverse side effects related to current therapy.   HTN - reports compliance with ongoing medical treatment and no changes in medication dose or frequency. denies adverse side effects related to current therapy.   obesity with hx "preDM" - had lost over 30# intentionally with diet and exercise efforts in 2009, but  weight gain noted - pt attributes to diet changes, poor food choices with stress  Past Medical History  Diagnosis Date  . Anxiety   . OBESITY   . RAYNAUDS SYNDROME   . Diabetes mellitus, type 2   . HYPERTENSION   . GERD     Review of Systems Constitutional: Negative for fever or unexpected weight change.  Respiratory: Negative for cough and shortness of breath.   Cardiovascular: Negative for chest pain or palpitations.      Objective:   Physical Exam  BP 130/78  Pulse 73  Temp 97.9 F (36.6 C) (Oral)  Ht 5\' 4"  (1.626 m)  Wt 217 lb 12.8 oz (98.793 kg)  BMI 37.39 kg/m2  SpO2 98% Wt Readings from Last 3 Encounters:  12/20/11 217 lb 12.8 oz (98.793 kg)  08/29/11 213 lb (96.616 kg)  06/26/11 218 lb 12.8 oz (99.247 kg)   Constitutional: She has chronic stutter; obese; but appears well-developed and  well-nourished. No distress.  Neck: Normal range of motion. Neck supple. No JVD present. No thyromegaly present.  Cardiovascular: Normal rate, regular rhythm and normal heart sounds.  No murmur heard. No BLE edema. Pulmonary/Chest: Effort normal and breath sounds normal. No respiratory distress. She has no wheezes.  Musculoskeletal: Mild tenderness over radial side of right wrist long thumb extensors - FROM but painful pronation and thumb extension - no joint effusions. No gross deformities Neurological: She is alert and oriented to person, place, and time. No cranial nerve deficit. Coordination normal.  Skin: Skin is warm and dry. No rash noted. No erythema.  Psychiatric: She has a normal mood and affect. Her behavior is normal. Judgment and thought content normal.      Lab Results  Component Value Date   WBC 5.2 07/13/2011   HGB 12.2 07/13/2011   HCT 37.0 07/13/2011   PLT 268.0 07/13/2011   CHOL 131 07/13/2011   TRIG 47.0 07/13/2011   HDL 43.70 07/13/2011   ALT 21 07/13/2011   AST 17 07/13/2011   NA 139 07/13/2011   K 4.2 07/13/2011   CL 103 07/13/2011   CREATININE 0.8 07/13/2011   BUN 10 07/13/2011   CO2 31 07/13/2011   TSH 1.15 07/13/2011   HGBA1C 6.2 07/13/2011   MICROALBUR 0.4 07/13/2011   Dg Wrist Complete Right  08/29/2011  *RADIOLOGY REPORT*  Clinical Data: Medial pain, no known injury  RIGHT WRIST -  COMPLETE 3+ VIEW  Comparison: None.  Findings: Four views of the right wrist submitted.  No acute fracture or subluxation.  No radiopaque foreign body.  IMPRESSION: No acute fracture or subluxation.  Original Report Authenticated By: Natasha Mead, M.D.   Assessment & Plan:   R wrist pain with pronation - suspect tendonitis - ongoing >4 mo and not resolved with conservative care. refer to hand for other eval and tx as needed  Also see problem list. Medications and labs reviewed today.

## 2011-12-20 NOTE — Assessment & Plan Note (Signed)
  Wt Readings from Last 3 Encounters:  12/20/11 217 lb 12.8 oz (98.793 kg)  08/29/11 213 lb (96.616 kg)  06/26/11 218 lb 12.8 oz (99.247 kg)  The patient is asked to make an attempt to improve diet and exercise patterns to aid in medical management of this problem.

## 2011-12-20 NOTE — Assessment & Plan Note (Signed)
The current medical regimen is generally effective;  continue present plan and medications. Also work on diet/exercise for weight control  BP Readings from Last 3 Encounters:  12/20/11 130/78  08/29/11 132/72  06/26/11 130/72

## 2011-12-22 ENCOUNTER — Telehealth: Payer: Self-pay | Admitting: Internal Medicine

## 2011-12-22 MED ORDER — TRIAMCINOLONE ACETONIDE 0.1 % EX CREA: TOPICAL_CREAM | Freq: Two times a day (BID) | CUTANEOUS | Status: DC

## 2011-12-22 MED ORDER — DICLOFENAC SODIUM 75 MG PO TBEC: 75.0000 mg | DELAYED_RELEASE_TABLET | Freq: Two times a day (BID) | ORAL | Status: DC

## 2011-12-22 MED ORDER — VENLAFAXINE HCL ER 150 MG PO CP24: 150.0000 mg | ORAL_CAPSULE | Freq: Every day | ORAL | Status: DC

## 2011-12-22 MED ORDER — OMEPRAZOLE 20 MG PO CPDR: 20.0000 mg | DELAYED_RELEASE_CAPSULE | Freq: Every day | ORAL | Status: DC

## 2011-12-22 MED ORDER — METOPROLOL SUCCINATE ER 100 MG PO TB24: 100.0000 mg | ORAL_TABLET | Freq: Every day | ORAL | Status: DC

## 2011-12-22 NOTE — Telephone Encounter (Signed)
Pt notified rx's sent to pharmacy...Suzanne [name]

## 2011-12-22 NOTE — Telephone Encounter (Signed)
Pt stated that her insurance will end on 01/07/12 and she req for all her med to be send to [facility] for 3 months supply. Please call pt when done or any question.

## 2012-05-06 ENCOUNTER — Encounter: Payer: Self-pay | Admitting: Internal Medicine

## 2012-05-06 ENCOUNTER — Other Ambulatory Visit (INDEPENDENT_AMBULATORY_CARE_PROVIDER_SITE_OTHER): Payer: BC Managed Care – PPO

## 2012-05-06 ENCOUNTER — Ambulatory Visit (INDEPENDENT_AMBULATORY_CARE_PROVIDER_SITE_OTHER): Payer: BC Managed Care – PPO | Admitting: Internal Medicine

## 2012-05-06 VITALS — BP 152/100 | HR 77 | Temp 98.0°F | Wt 215.0 lb

## 2012-05-06 DIAGNOSIS — R102 Pelvic and perineal pain: Secondary | ICD-10-CM

## 2012-05-06 DIAGNOSIS — F411 Generalized anxiety disorder: Secondary | ICD-10-CM

## 2012-05-06 DIAGNOSIS — F419 Anxiety disorder, unspecified: Secondary | ICD-10-CM

## 2012-05-06 DIAGNOSIS — R109 Unspecified abdominal pain: Secondary | ICD-10-CM

## 2012-05-06 DIAGNOSIS — I10 Essential (primary) hypertension: Secondary | ICD-10-CM

## 2012-05-06 DIAGNOSIS — E119 Type 2 diabetes mellitus without complications: Secondary | ICD-10-CM

## 2012-05-06 DIAGNOSIS — N644 Mastodynia: Secondary | ICD-10-CM

## 2012-05-06 LAB — CBC WITH DIFFERENTIAL/PLATELET
Basophils Absolute: 0 10*3/uL (ref 0.0–0.1)
Basophils Relative: 1.2 % (ref 0.0–3.0)
Eosinophils Absolute: 0.1 10*3/uL (ref 0.0–0.7)
Eosinophils Relative: 1.4 % (ref 0.0–5.0)
HCT: 36.2 % (ref 36.0–46.0)
Hemoglobin: 12 g/dL (ref 12.0–15.0)
Lymphocytes Relative: 38.5 % (ref 12.0–46.0)
Lymphs Abs: 1.6 10*3/uL (ref 0.7–4.0)
MCHC: 33.2 g/dL (ref 30.0–36.0)
MCV: 85.7 fl (ref 78.0–100.0)
Monocytes Absolute: 0.3 10*3/uL (ref 0.1–1.0)
Monocytes Relative: 6.7 % (ref 3.0–12.0)
Neutro Abs: 2.1 10*3/uL (ref 1.4–7.7)
Neutrophils Relative %: 52.2 % (ref 43.0–77.0)
Platelets: 285 10*3/uL (ref 150.0–400.0)
RBC: 4.22 Mil/uL (ref 3.87–5.11)
RDW: 14.9 % — ABNORMAL HIGH (ref 11.5–14.6)
WBC: 4.1 10*3/uL — ABNORMAL LOW (ref 4.5–10.5)

## 2012-05-06 LAB — BASIC METABOLIC PANEL
BUN: 11 mg/dL (ref 6–23)
CO2: 28 mEq/L (ref 19–32)
Calcium: 8.9 mg/dL (ref 8.4–10.5)
Chloride: 104 mEq/L (ref 96–112)
Creatinine, Ser: 0.7 mg/dL (ref 0.4–1.2)
GFR: 110.33 mL/min (ref >60.00)
Glucose, Bld: 83 mg/dL (ref 70–99)
Potassium: 3.7 mEq/L (ref 3.5–5.1)
Sodium: 138 mEq/L (ref 135–145)

## 2012-05-06 LAB — URINALYSIS, ROUTINE W REFLEX MICROSCOPIC
Bilirubin Urine: NEGATIVE
Ketones, ur: NEGATIVE
Nitrite: NEGATIVE
Specific Gravity, Urine: <=1.005 (ref 1.000–1.030)
Total Protein, Urine: NEGATIVE
Urine Glucose: NEGATIVE
Urobilinogen, UA: 0.2 (ref 0.0–1.0)
pH: 6 (ref 5.0–8.0)

## 2012-05-06 LAB — HEPATIC FUNCTION PANEL
ALT: 16 U/L (ref 0–35)
AST: 22 U/L (ref 0–37)
Albumin: 3.8 g/dL (ref 3.5–5.2)
Alkaline Phosphatase: 89 U/L (ref 39–117)
Bilirubin, Direct: 0 mg/dL (ref 0.0–0.3)
Total Bilirubin: 0.5 mg/dL (ref 0.3–1.2)
Total Protein: 7.4 g/dL (ref 6.0–8.3)

## 2012-05-06 LAB — HEMOGLOBIN A1C: Hgb A1c MFr Bld: 6.3 % (ref 4.6–6.5)

## 2012-05-06 LAB — TSH: TSH: 0.53 u[IU]/mL (ref 0.35–5.50)

## 2012-05-06 NOTE — Patient Instructions (Signed)
It was good to see you today. We have reviewed your prior records including labs and tests today Test(s) ordered today. Your results will be released to MyChart (or called to you) after review, usually within 72hours after test completion. If any changes need to be made, you will be notified at that same time. we'll make referral for diagnostic mammogram  . Our office will contact you regarding appointment(s) once made. Medications reviewed and updated, no changes at this time. Please take all as prescribed Please schedule followup in 6 months, call sooner if problems.

## 2012-05-06 NOTE — Assessment & Plan Note (Signed)
Compliance limited by cost/uninsured status  continue present plan and medications. Also work on diet/exercise for weight control  BP Readings from Last 3 Encounters:  05/06/12 152/100  12/20/11 130/78  08/29/11 132/72

## 2012-05-06 NOTE — Progress Notes (Signed)
  Subjective:    Patient ID: Suzanne [name], female    DOB: 12-Aug-1955, 57 y.o.   MRN: [id number]  GI Problem The primary symptoms include fatigue, abdominal pain (suprapubic, worse with BM) and myalgias. Primary symptoms do not include fever, weight loss, nausea, vomiting, diarrhea, melena, hematochezia, dysuria, arthralgias or rash. The illness began more than 7 days ago. The onset was gradual. The problem has been gradually improving.  The abdominal pain began more than 2 days ago. The abdominal pain has been unchanged since its onset. The abdominal pain is located in the suprapubic region. The abdominal pain does not radiate.  The illness does not include tenesmus or back pain. Associated medical issues do not include inflammatory bowel disease, GERD, liver disease, alcohol abuse, gastric bypass or bowel resection.   Past Medical History  Diagnosis Date  . Anxiety   . OBESITY   . RAYNAUDS SYNDROME   . Diabetes mellitus, type 2   . HYPERTENSION   . GERD     Review of Systems  Constitutional: Positive for fatigue. Negative for fever and weight loss.  Gastrointestinal: Positive for abdominal pain (suprapubic, worse with BM). Negative for nausea, vomiting, diarrhea, melena and hematochezia.  Genitourinary: Negative for dysuria.  Musculoskeletal: Positive for myalgias. Negative for back pain and arthralgias.  Skin: Negative for rash.       Objective:   Physical Exam BP 152/100  Pulse 77  Temp 98 F (36.7 C) (Oral)  Wt 215 lb (97.523 kg)  SpO2 99% Wt Readings from Last 3 Encounters:  05/06/12 215 lb (97.523 kg)  12/20/11 217 lb 12.8 oz (98.793 kg)  08/29/11 213 lb (96.616 kg)   Constitutional: She is overweight, but appears well-developed and well-nourished. No distress. nontoxic. Stuttering without change Neck: Normal range of motion. Neck supple. No JVD present. No thyromegaly present.  Cardiovascular: Normal rate, regular rhythm and normal heart sounds.  No murmur  heard. No BLE edema. Pulmonary/Chest: Effort normal and breath sounds normal. No respiratory distress. She has no wheezes.  Breast: supervised by Connye Burkitt PA stuent: mild thickening L lateral breast over tender area but no mass, no discharge - no bruising or rash Abdominal: Soft. Bowel sounds are normal. She exhibits no distension. There is no tenderness. no masses Psychiatric: She has a normal mood and affect. Her behavior is normal. Judgment and thought content normal.   Lab Results  Component Value Date   WBC 5.2 07/13/2011   HGB 12.2 07/13/2011   HCT 37.0 07/13/2011   PLT 268.0 07/13/2011   GLUCOSE 94 07/13/2011   CHOL 131 07/13/2011   TRIG 47.0 07/13/2011   HDL 43.70 07/13/2011   LDLCALC 78 07/13/2011   ALT 21 07/13/2011   AST 17 07/13/2011   NA 139 07/13/2011   K 4.2 07/13/2011   CL 103 07/13/2011   CREATININE 0.8 07/13/2011   BUN 10 07/13/2011   CO2 31 07/13/2011   TSH 1.15 07/13/2011   HGBA1C 6.0 12/20/2011   MICROALBUR 0.4 07/13/2011      Assessment & Plan:   abdominal pain, suprapubic - nonspecific - present > 3 weeks - check labs and UA - suspect IBS flare with multiple social stressors  L breast pain - no discrete mass on exam - last screen mammo 06/2011 benign,  Refer for dx mammo now

## 2012-05-06 NOTE — Assessment & Plan Note (Signed)
Weight gain reviewed - continue to work on diet/exercise/wt loss dm2, diet controlled  Will recheck a1c today and consider meds as needed  Lab Results  Component Value Date   HGBA1C 6.0 12/20/2011

## 2012-05-06 NOTE — Assessment & Plan Note (Signed)
Previously on citalopram  Changed to venlafex 11/2010 for hot flashes symptoms exac by social stressors - unemployed, 59 yo son, elderly parent -  Offered referral to counseling for support - pt declines at this time

## 2012-05-15 ENCOUNTER — Ambulatory Visit
Admission: RE | Admit: 2012-05-15 | Discharge: 2012-05-15 | Disposition: A | Discharge status: Home / Self Care | Payer: BC Managed Care – PPO | Source: Ambulatory Visit | Attending: Internal Medicine | Admitting: Internal Medicine

## 2012-05-15 DIAGNOSIS — N644 Mastodynia: Secondary | ICD-10-CM

## 2012-06-05 IMAGING — CR DG WRIST COMPLETE 3+V*R*
2 series · 2 of 2 positions shown · non-contrast
Comparison: None.

CLINICAL DATA: Medial pain, no known injury

RIGHT WRIST - COMPLETE 3+ VIEW

[view not recorded (1 of 2)]
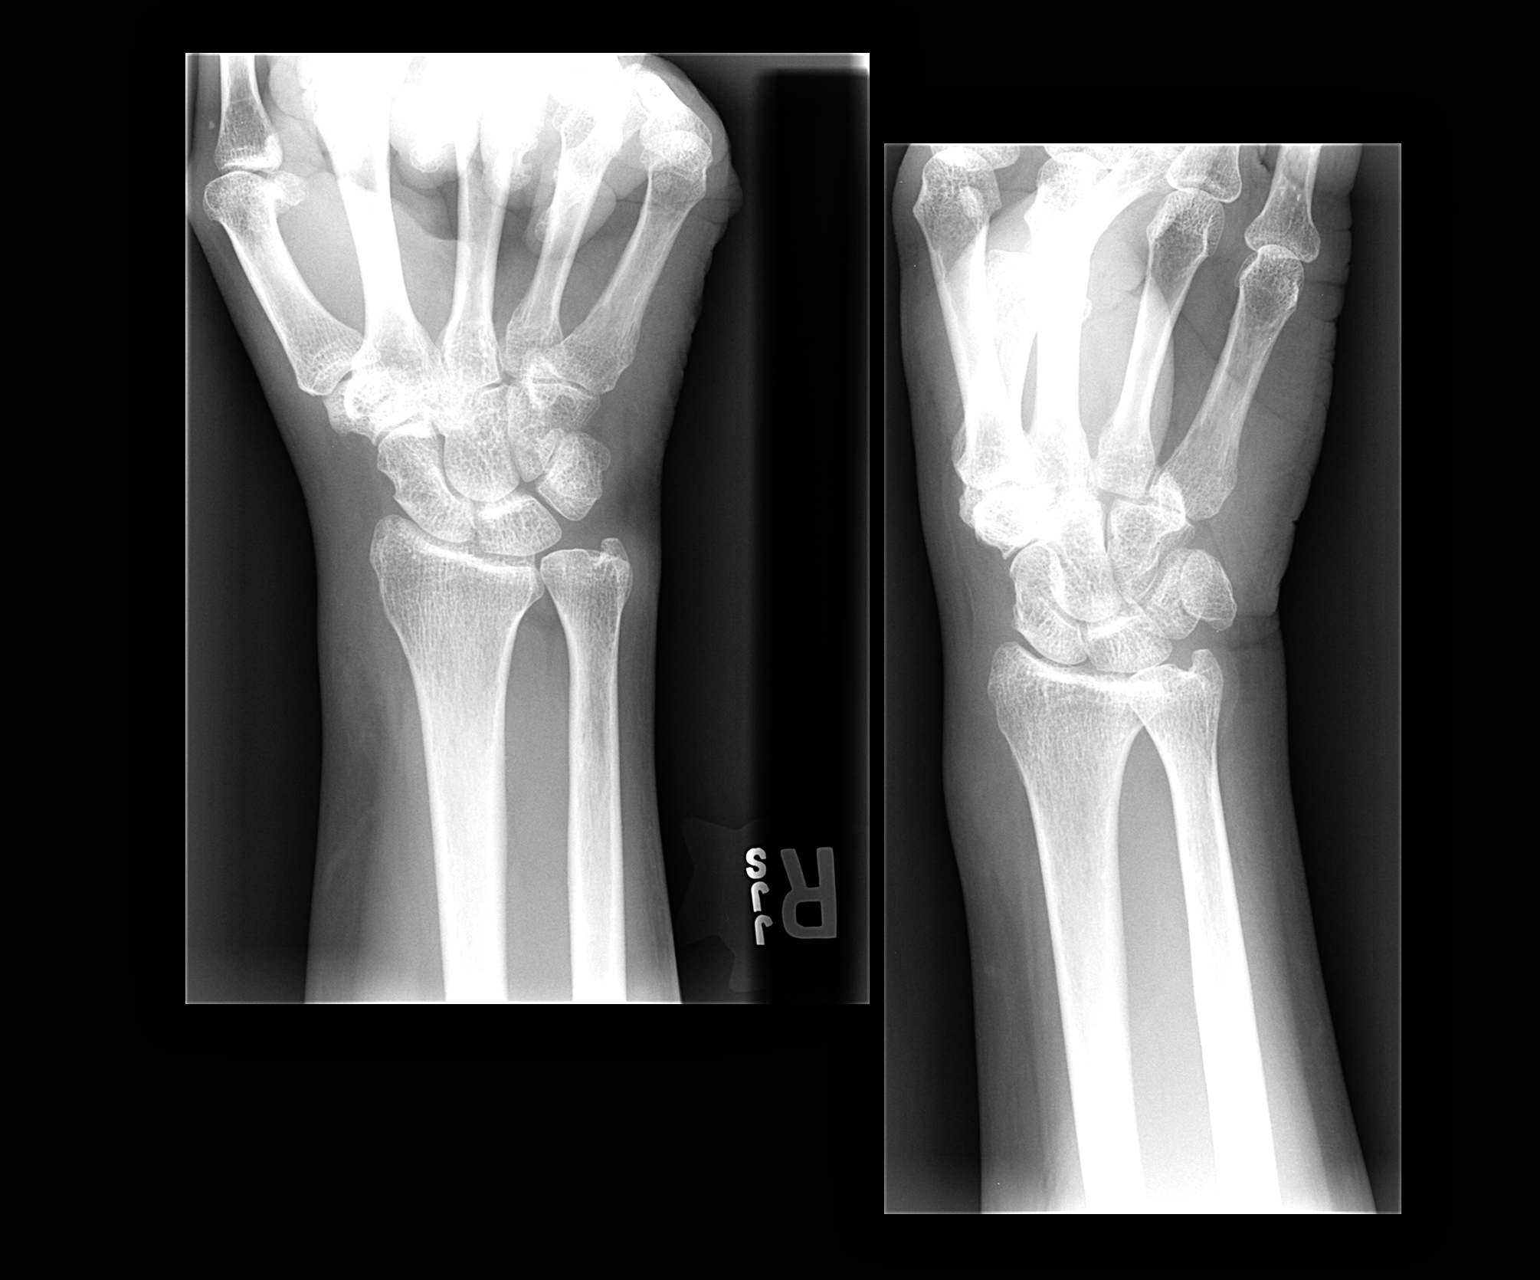

[view not recorded (2 of 2)]
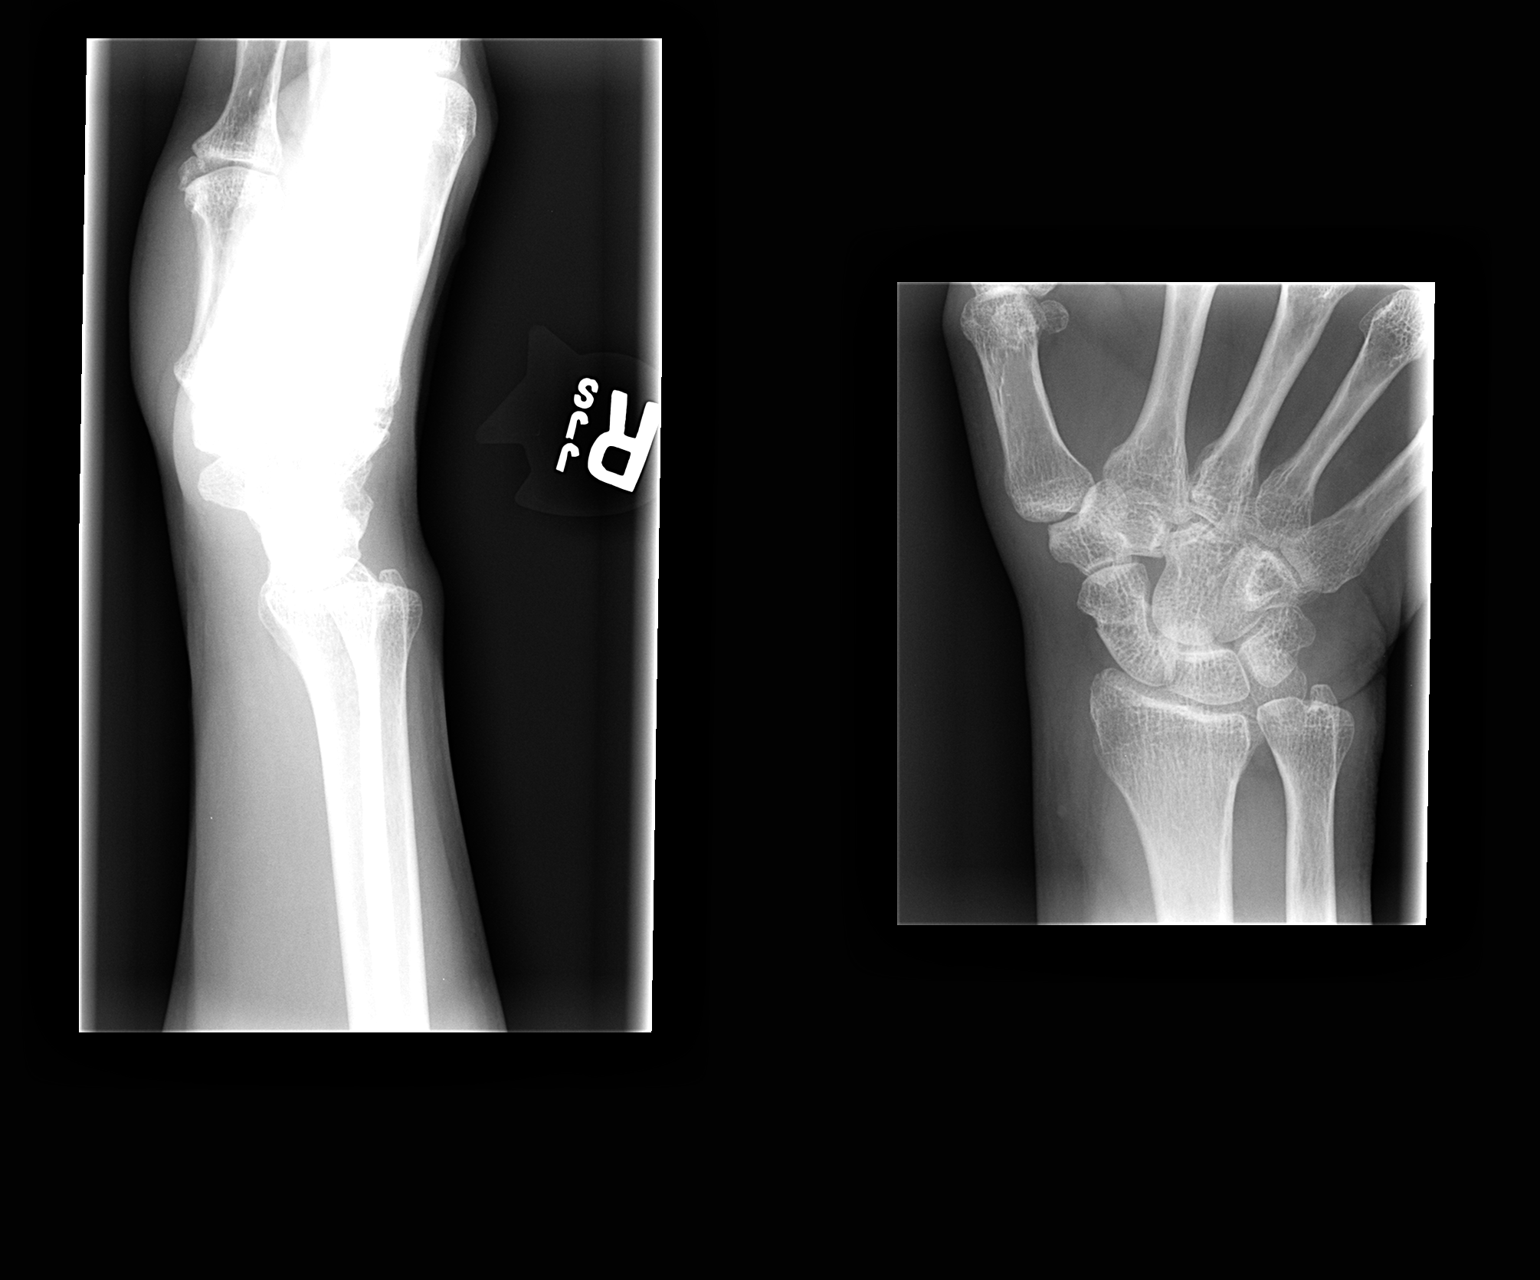

[2 of 2 positions shown; findings below may reference images not displayed]

FINDINGS: Four views of the right wrist submitted.  No acute
fracture or subluxation.  No radiopaque foreign body.
IMPRESSION: No acute fracture or subluxation.

## 2012-07-26 ENCOUNTER — Encounter: Payer: Self-pay | Admitting: Internal Medicine

## 2012-07-26 ENCOUNTER — Ambulatory Visit (INDEPENDENT_AMBULATORY_CARE_PROVIDER_SITE_OTHER): Payer: BC Managed Care – PPO | Admitting: Internal Medicine

## 2012-07-26 VITALS — BP 146/88 | HR 73 | Temp 98.1°F | Resp 12 | Ht 63.5 in | Wt 222.0 lb

## 2012-07-26 DIAGNOSIS — H60399 Other infective otitis externa, unspecified ear: Secondary | ICD-10-CM

## 2012-07-26 DIAGNOSIS — H60391 Other infective otitis externa, right ear: Secondary | ICD-10-CM

## 2012-07-26 MED ORDER — CIPROFLOXACIN-DEXAMETHASONE 0.3-0.1 % OT SUSP: 4.0000 [drp] | Freq: Two times a day (BID) | OTIC | Status: DC

## 2012-07-26 NOTE — Patient Instructions (Addendum)
It was good to see you today. Ear drops as discussed - Your prescription(s) have been submitted to your pharmacy. Please take as directed and contact our office if you believe you are having problem(s) with the medication(s). Call if worse or unimproved in next 5-7 days with treatment  Otitis Externa Otitis externa is a germ infection in the outer ear. The outer ear is the area from the eardrum to the outside of the ear. Otitis externa is sometimes called "swimmer's ear." HOME CARE  Put drops in the ear as told by your doctor.  Only take medicine as told by your doctor.  If you have diabetes, your doctor may give you more directions. Follow your doctor's directions.  Keep all doctor visits as told. To avoid another infection:  Keep your ear dry. Use the corner of a towel to dry your ear after swimming or bathing.  Avoid scratching or putting things inside your ear.  Avoid swimming in lakes, dirty water, or pools that use a chemical called chlorine poorly.  You may use ear drops after swimming. Combine equal amounts of white vinegar and alcohol in a bottle. Put 3 or 4 drops in each ear. GET HELP RIGHT AWAY IF:   You have a fever.  Your ear is still red, puffy (swollen), or painful after 3 days.  You still have yellowish-white fluid (pus) coming from the ear after 3 days.  Your redness, puffiness, or pain gets worse.  You have a really bad headache.  You have redness, puffiness, pain, or tenderness behind your ear. MAKE SURE YOU:   Understand these instructions.  Will watch your condition.  Will get help right away if you are not doing well or get worse. Document Released: 09/27/2007 Document Revised: 07/03/2011 Document Reviewed: 04/27/2011 [facility] Patient Information 2013 [place], [place].

## 2012-07-26 NOTE — Progress Notes (Signed)
  Subjective:    Patient ID: Suzanne [name], female    DOB: 1955-06-19, 57 y.o.   MRN: [id number]  Otalgia  There is pain in the right ear. This is a new problem. The current episode started in the past 7 days. The problem occurs constantly. The problem has been unchanged. There has been no fever. The pain is at a severity of 5/10. The pain is moderate. Associated symptoms include neck pain (on right). Pertinent negatives include no ear discharge, headaches, hearing loss or sore throat. She has tried ear drops for the symptoms. There is no history of a chronic ear infection or hearing loss.   Past Medical History  Diagnosis Date  . Anxiety   . OBESITY   . RAYNAUDS SYNDROME   . Diabetes mellitus, type 2   . HYPERTENSION   . GERD     Review of Systems  HENT: Positive for ear pain and neck pain (on right). Negative for hearing loss, sore throat and ear discharge.   Neurological: Negative for headaches.       Objective:   Physical Exam  BP 146/88  Pulse 73  Temp(Src) 98.1 F (36.7 C) (Oral)  Resp 12  Ht 5' 3.5" (1.613 m)  Wt 222 lb (100.699 kg)  BMI 38.7 kg/m2  SpO2 98% Wt Readings from Last 3 Encounters:  07/26/12 222 lb (100.699 kg)  05/06/12 215 lb (97.523 kg)  12/20/11 217 lb 12.8 oz (98.793 kg)   Constitutional: She is overweight, but appears well-developed and well-nourished. No distress. nontoxic. Stuttering without change HENT: R OE, mild - TM clear without effusion - no cerumen - no pinna swelling Neck: Normal range of motion. Neck supple. No JVD or LAD present. No thyromegaly present.  Cardiovascular: Normal rate, regular rhythm and normal heart sounds.  No murmur heard. No BLE edema. Pulmonary/Chest: Effort normal and breath sounds normal. No respiratory distress. She has no wheezes. Psychiatric: She has a normal mood and affect. Her behavior is normal. Judgment and thought content normal.   Lab Results  Component Value Date   WBC 4.1* 05/06/2012   HGB  12.0 05/06/2012   HCT 36.2 05/06/2012   PLT 285.0 05/06/2012   GLUCOSE 83 05/06/2012   CHOL 131 07/13/2011   TRIG 47.0 07/13/2011   HDL 43.70 07/13/2011   LDLCALC 78 07/13/2011   ALT 16 05/06/2012   AST 22 05/06/2012   NA 138 05/06/2012   K 3.7 05/06/2012   CL 104 05/06/2012   CREATININE 0.7 05/06/2012   BUN 11 05/06/2012   CO2 28 05/06/2012   TSH 0.53 05/06/2012   HGBA1C 6.3 05/06/2012   MICROALBUR 0.4 07/13/2011      Assessment & Plan:   R OE - drops rx Education provided

## 2012-08-12 ENCOUNTER — Other Ambulatory Visit: Payer: Self-pay | Admitting: Internal Medicine

## 2012-08-23 ENCOUNTER — Ambulatory Visit: Payer: Self-pay | Admitting: Internal Medicine

## 2012-08-23 ENCOUNTER — Telehealth: Payer: Self-pay | Admitting: Internal Medicine

## 2012-08-23 NOTE — Telephone Encounter (Signed)
Noted. thx 

## 2012-08-23 NOTE — Telephone Encounter (Signed)
Patient Information:  Caller Name: Malin  Phone: [phone]  Patient: Suzanne [name], Suzanne [name]  Gender: Female  DOB: 1956/04/19  Age: 57 Years  PCP: Rene Paci (Adults only)  Office Follow Up:  Does the office need to follow up with this patient?: No  Instructions For The Office: N/A  RN Note:  Pt requested RN help make her an appt.  RN called the office [facility]) to try to help getting an appt for pt.  RN left message with Dr Ambrose Mantle asst.  [facility] desk states Dr Ambrose Mantle is scheduling pts to be seen in June).  RN scheduled patient to be seen at the office with NP Nicki Reaper  Symptoms  Reason For Call & Symptoms: vaginal bumps (pt noticed 1 month ago).  Pt states it was from her underpants being too tight.  Pt states now there is a mass in the crease of her legs and on vagina.  Pt was wanting to get into see her OB/GYN but pt didnot know the name of OB/GYN doctor.  RN was able to find Dr Ambrose Mantle on patients chart.  Reviewed Health History In EMR: Yes  Reviewed Medications In EMR: Yes  Reviewed Allergies In EMR: Yes  Reviewed Surgeries / Procedures: Yes  Date of Onset of Symptoms: Unknown  Treatments Tried: perioxide  Treatments Tried Worked: No  Guideline(s) Used:  Skin Lesion - Moles or Growths  Disposition Per Guideline:   See Today or Tomorrow in Office  Reason For Disposition Reached:   Caller cannot describe it clearly  Advice Given:  N/A  Patient Will Follow Care Advice:  YES  Appointment Scheduled:  08/23/2012 13:15:00 Appointment Scheduled Provider:  Nicki Reaper  (RN talked with office about appt, office said appt was ok and they would make it a 30 min acute)

## 2012-08-26 ENCOUNTER — Other Ambulatory Visit (HOSPITAL_COMMUNITY)
Admission: RE | Admit: 2012-08-26 | Discharge: 2012-08-26 | Disposition: A | Discharge status: Home / Self Care | Payer: BC Managed Care – PPO | Source: Ambulatory Visit | Attending: Internal Medicine | Admitting: Internal Medicine

## 2012-08-26 ENCOUNTER — Encounter: Payer: Self-pay | Admitting: Internal Medicine

## 2012-08-26 ENCOUNTER — Ambulatory Visit (INDEPENDENT_AMBULATORY_CARE_PROVIDER_SITE_OTHER): Payer: BC Managed Care – PPO | Admitting: Internal Medicine

## 2012-08-26 VITALS — BP 130/92 | HR 68 | Temp 98.1°F | Wt 222.0 lb

## 2012-08-26 DIAGNOSIS — R102 Pelvic and perineal pain: Secondary | ICD-10-CM

## 2012-08-26 DIAGNOSIS — L731 Pseudofolliculitis barbae: Secondary | ICD-10-CM

## 2012-08-26 DIAGNOSIS — L678 Other hair color and hair shaft abnormalities: Secondary | ICD-10-CM

## 2012-08-26 DIAGNOSIS — L738 Other specified follicular disorders: Secondary | ICD-10-CM

## 2012-08-26 DIAGNOSIS — Z01419 Encounter for gynecological examination (general) (routine) without abnormal findings: Secondary | ICD-10-CM | POA: Insufficient documentation

## 2012-08-26 DIAGNOSIS — Z124 Encounter for screening for malignant neoplasm of cervix: Secondary | ICD-10-CM

## 2012-08-26 DIAGNOSIS — N949 Unspecified condition associated with female genital organs and menstrual cycle: Secondary | ICD-10-CM

## 2012-08-26 MED ORDER — MUPIROCIN 2 % EX OINT: TOPICAL_OINTMENT | Freq: Three times a day (TID) | CUTANEOUS | Status: DC

## 2012-08-26 NOTE — Patient Instructions (Addendum)
It was good to see you today. The irritation in your skin appears related to ingrown hair - see details below regarding management of same Okay to use antibiotic ointment to the irritated areas as needed - Your prescription(s) have been submitted to your pharmacy. Please take as directed and contact our office if you believe you are having problem(s) with the medication(s). Pap smear performed today, we will contact you regarding these results Follow up as regularly scheduled, call sooner if problems or if referral to gynecologist as neededIngrown Hair An ingrown hair is a hair that curls and re-enters the skin instead of growing straight out of the skin. It happens most often with curly hair. It is usually more severe in the neck area, but it can occur in any shaved area, including the beard area, groin, scalp, and legs. An ingrown hair may cause small pockets of infection. CAUSES  Shaving closely, tweezing, or waxing, especially curly hair. Using hair removal creams can sometimes lead to ingrown hairs, especially in the groin. SYMPTOMS   Small bumps on the skin. The bumps may be filled with pus.  Pain.  Itching. DIAGNOSIS  Your caregiver can usually tell what is wrong by doing a physical exam. TREATMENT  If there is a severe infection, your caregiver may prescribe antibiotic medicines. Laser hair removal may also be done to help prevent regrowth of the hair. HOME CARE INSTRUCTIONS   Do not shave irritated skin. You may start shaving again once the irritation has gone away.  If you are prone to ingrown hairs, consider not shaving as much as possible.  If antibiotics are prescribed, take them as directed. Finish them even if you start to feel better.  You may use a facial sponge in a gentle circular motion to help dislodge ingrown hairs on the face.  You may use a hair removal cream weekly, especially on the legs and underarms. Stop using the cream if it irritates your skin. Use caution  when using hair removal creams in the groin area. SHAVING INSTRUCTIONS AFTER TREATMENT  Shower before shaving. Keep areas to be shaved packed in warm, moist wraps for several minutes before shaving. The warm, moist environment helps soften the hairs and makes ingrown hairs less likely to occur.  Use thick shaving gels.  Use a bump fighter razor that cuts hair slightly above the skin level or use an electric shaver with a longer shave setting.  Shave in the direction of hair growth. Avoid making multiple razor strokes.  Use moisturizing lotions after shaving. Document Released: 07/17/2000 Document Revised: 10/10/2011 Document Reviewed: 07/11/2011 [facility] Patient Information 2013 [place], [place].

## 2012-08-26 NOTE — Progress Notes (Signed)
  Subjective:    Patient ID: Suzanne [name], female    DOB: 06/23/1955, 57 y.o.   MRN: [id number]  HPI  Patient here today for "infection and mass in the vaginal area" x 2 months.  The "infection and mass" are associated with pruritis and mild pain, and does bleed occasionally when she itches it.  Patient has tried some peroxide, hydrocortisone cream, and vaseline at home.  She denies fever, chills, nausea, vomiting, vaginal discharge, urinary symptoms, and vaginal bleeding.  She denies any change in routine with partner, soaps, detergents, or personal products.      Review of Systems  Constitutional: Negative for fever, chills and fatigue.  Gastrointestinal: Negative for nausea and vomiting.  Genitourinary: Negative for dysuria, urgency, frequency, hematuria, flank pain, vaginal bleeding, vaginal discharge and vaginal pain.  Skin: Positive for rash.  All other systems reviewed and are negative.       Objective:   Physical Exam  Nursing note and vitals reviewed. Constitutional: She is oriented to person, place, and time. She appears well-developed and well-nourished.  HENT:  Head: Normocephalic and atraumatic.  Neck: Normal range of motion. Neck supple.  Cardiovascular: Normal rate.   Genitourinary: Vagina normal and uterus normal. Pelvic exam was performed with patient supine. There is no rash, tenderness, lesion or injury on the right labia. There is no rash, tenderness, lesion or injury on the left labia. Cervix exhibits no motion tenderness, no discharge and no friability. Right adnexum displays no mass, no tenderness and no fullness. Left adnexum displays no mass, no tenderness and no fullness. No erythema, tenderness or bleeding around the vagina. No foreign body around the vagina. No signs of injury around the vagina. No vaginal discharge found.  Evidence of healing ingrown hairs found on the left margin of the pubic hairs.  Musculoskeletal: Normal range of motion.   Neurological: She is alert and oriented to person, place, and time.  Skin: Skin is warm and dry.  Psychiatric: She has a normal mood and affect. Her behavior is normal.  Pronounced stutter.          Assessment & Plan:  Patient presents with chief complaint of "infection and mass in the vaginal area"  Patient history was reviewed.  Physical exam finding consistent with folliculitis due to shaving.  Patient prescribed Bactroban for use PRN and was instructed to use clean razor and exfoliate with wash cloth while washing.    Pap smear was also performed today.  Will refer to gynecology if results abnormal.     I have personally reviewed this case with PA student. I also personally examined this patient. I agree with history and findings as documented above. I reviewed, discussed and approve of the assessment and plan as listed above. Rene Paci, MD

## 2012-08-26 NOTE — Progress Notes (Signed)
  Subjective:    Patient ID: Suzanne [name], female    DOB: [birth year], 57 y.o.   MRN: [id number]  HPI  See chief complaint, CAN phone note and PA student note  Past Medical History  Diagnosis Date  . Anxiety   . OBESITY   . RAYNAUDS SYNDROME   . Diabetes mellitus, type 2   . HYPERTENSION   . GERD     Review of Systems  Respiratory: Negative for cough and shortness of breath.   Gastrointestinal: Negative for nausea, vomiting, abdominal pain, diarrhea, constipation, blood in stool, abdominal distention and rectal pain.  Genitourinary: Positive for urgency, vaginal discharge and vaginal pain. Negative for hematuria, flank pain, vaginal bleeding, menstrual problem and pelvic pain. Genital sores: ?       Objective:   Physical Exam BP 130/92  Pulse 68  Temp(Src) 98.1 F (36.7 C) (Oral)  Wt 222 lb (100.699 kg)  BMI 38.7 kg/m2  SpO2 97% Wt Readings from Last 3 Encounters:  08/26/12 222 lb (100.699 kg)  07/26/12 222 lb (100.699 kg)  05/06/12 215 lb (97.523 kg)   Constitutional: She is overweight, but appears well-developed and well-nourished. No distress. chronic stutter without change Cardiovascular: Normal rate, regular rhythm and normal heart sounds.  No murmur heard. No BLE edema. Pulmonary/Chest: Effort normal and breath sounds normal. No respiratory distress. She has no wheezes.  Abdominal: Soft. Bowel sounds are normal. She exhibits no distension. There is no tenderness. no masses GU: supervised by PA student -normal external genitalia, no evidence of discharge or inflammation. Ingrown hairs on left side of mons with mild inflammation, no furuncle or drainage. Normal cervix without discharge her polyp. Bimanual exam with normal uterus size, no ovarian enlargement or tenderness. Skin: see GU above - Skin is warm and dry. No rash noted. No erythema.  Psychiatric: She has a normal mood and affect. Her behavior is normal. Judgment and thought content normal.   Lab  Results  Component Value Date   WBC 4.1* 05/06/2012   HGB 12.0 05/06/2012   HCT 36.2 05/06/2012   PLT 285.0 05/06/2012   GLUCOSE 83 05/06/2012   CHOL 131 07/13/2011   TRIG 47.0 07/13/2011   HDL 43.70 07/13/2011   LDLCALC 78 07/13/2011   ALT 16 05/06/2012   AST 22 05/06/2012   NA 138 05/06/2012   K 3.7 05/06/2012   CL 104 05/06/2012   CREATININE 0.7 05/06/2012   BUN 11 05/06/2012   CO2 28 05/06/2012   TSH 0.53 05/06/2012   HGBA1C 6.3 05/06/2012   MICROALBUR 0.4 07/13/2011      Assessment & Plan:   "vaginal cyst and pain" Evidence for ingrown hair at site of symptoms on exam The patient is reassured that these symptoms do not appear to represent a serious or threatening condition.  symptomatic care advised, biopsy ointment prn if irritation To follow up with gyn if unimproved   Screen cervical ca today - PAP/pelvic as above

## 2012-09-19 ENCOUNTER — Other Ambulatory Visit: Payer: Self-pay | Admitting: Internal Medicine

## 2013-02-03 ENCOUNTER — Other Ambulatory Visit: Payer: Self-pay | Admitting: Internal Medicine

## 2013-08-14 ENCOUNTER — Other Ambulatory Visit: Payer: Self-pay | Admitting: Internal Medicine

## 2013-08-18 ENCOUNTER — Other Ambulatory Visit: Payer: Self-pay | Admitting: Internal Medicine

## 2013-09-12 ENCOUNTER — Other Ambulatory Visit (INDEPENDENT_AMBULATORY_CARE_PROVIDER_SITE_OTHER): Payer: BC Managed Care – PPO

## 2013-09-12 ENCOUNTER — Encounter: Payer: Self-pay | Admitting: Internal Medicine

## 2013-09-12 ENCOUNTER — Ambulatory Visit (INDEPENDENT_AMBULATORY_CARE_PROVIDER_SITE_OTHER): Payer: BC Managed Care – PPO | Admitting: Internal Medicine

## 2013-09-12 VITALS — BP 120/84 | HR 69 | Temp 97.0°F | Wt 222.0 lb

## 2013-09-12 DIAGNOSIS — Z0000 Encounter for general adult medical examination without abnormal findings: Secondary | ICD-10-CM

## 2013-09-12 DIAGNOSIS — I10 Essential (primary) hypertension: Secondary | ICD-10-CM

## 2013-09-12 DIAGNOSIS — E119 Type 2 diabetes mellitus without complications: Secondary | ICD-10-CM

## 2013-09-12 DIAGNOSIS — F985 Adult onset fluency disorder: Secondary | ICD-10-CM

## 2013-09-12 DIAGNOSIS — N760 Acute vaginitis: Secondary | ICD-10-CM

## 2013-09-12 DIAGNOSIS — E669 Obesity, unspecified: Secondary | ICD-10-CM

## 2013-09-12 LAB — HEPATIC FUNCTION PANEL
ALT: 18 U/L (ref 0–35)
AST: 22 U/L (ref 0–37)
Albumin: 3.5 g/dL (ref 3.5–5.2)
Alkaline Phosphatase: 98 U/L (ref 39–117)
Bilirubin, Direct: 0.1 mg/dL (ref 0.0–0.3)
Total Bilirubin: 0.4 mg/dL (ref 0.2–1.2)
Total Protein: 7.3 g/dL (ref 6.0–8.3)

## 2013-09-12 LAB — LIPID PANEL
Cholesterol: 149 mg/dL (ref 0–200)
HDL: 48.1 mg/dL (ref >39.00)
LDL Cholesterol: 92 mg/dL (ref 0–99)
Total CHOL/HDL Ratio: 3
Triglycerides: 46 mg/dL (ref 0.0–149.0)
VLDL: 9.2 mg/dL (ref 0.0–40.0)

## 2013-09-12 LAB — CBC WITH DIFFERENTIAL/PLATELET
Basophils Absolute: 0 10*3/uL (ref 0.0–0.1)
Basophils Relative: 0.7 % (ref 0.0–3.0)
Eosinophils Absolute: 0.2 10*3/uL (ref 0.0–0.7)
Eosinophils Relative: 4.1 % (ref 0.0–5.0)
HCT: 37.9 % (ref 36.0–46.0)
Hemoglobin: 12.4 g/dL (ref 12.0–15.0)
Lymphocytes Relative: 36.7 % (ref 12.0–46.0)
Lymphs Abs: 2.1 10*3/uL (ref 0.7–4.0)
MCHC: 32.8 g/dL (ref 30.0–36.0)
MCV: 87.6 fl (ref 78.0–100.0)
Monocytes Absolute: 0.5 10*3/uL (ref 0.1–1.0)
Monocytes Relative: 8.9 % (ref 3.0–12.0)
Neutro Abs: 2.9 10*3/uL (ref 1.4–7.7)
Neutrophils Relative %: 49.6 % (ref 43.0–77.0)
Platelets: 309 10*3/uL (ref 150.0–400.0)
RBC: 4.32 Mil/uL (ref 3.87–5.11)
RDW: 15.2 % (ref 11.5–15.5)
WBC: 5.8 10*3/uL (ref 4.0–10.5)

## 2013-09-12 LAB — TSH: TSH: 2.39 u[IU]/mL (ref 0.35–4.50)

## 2013-09-12 LAB — MICROALBUMIN / CREATININE URINE RATIO
Creatinine,U: 101.1 mg/dL
Microalb Creat Ratio: 0.9 mg/g (ref 0.0–30.0)
Microalb, Ur: 0.9 mg/dL (ref 0.0–1.9)

## 2013-09-12 LAB — BASIC METABOLIC PANEL
BUN: 10 mg/dL (ref 6–23)
CO2: 31 mEq/L (ref 19–32)
Calcium: 9.5 mg/dL (ref 8.4–10.5)
Chloride: 100 mEq/L (ref 96–112)
Creatinine, Ser: 0.8 mg/dL (ref 0.4–1.2)
GFR: 101.42 mL/min (ref >60.00)
Glucose, Bld: 79 mg/dL (ref 70–99)
Potassium: 3.8 mEq/L (ref 3.5–5.1)
Sodium: 138 mEq/L (ref 135–145)

## 2013-09-12 LAB — HEMOGLOBIN A1C: Hgb A1c MFr Bld: 6.3 % (ref 4.6–6.5)

## 2013-09-12 MED ORDER — KETOCONAZOLE 2 % EX CREA: 1.0000 "application " | TOPICAL_CREAM | Freq: Every day | CUTANEOUS | Status: DC

## 2013-09-12 NOTE — Patient Instructions (Addendum)
It was good to see you today.  We have reviewed your prior records including labs and tests today  Health Maintenance reviewed - all recommended immunizations and age-appropriate screenings are up-to-date.  Test(s) ordered today. Your results will be released to [place] (or called to you) after review, usually within 72hours after test completion. If any changes need to be made, you will be notified at that same time.  we'll make referral to neurologist for your speech and gynecologist for your vaginitis . Our office will contact you regarding appointment(s) once made.  Medications reviewed and updated, no changes recommended at this time.  Work on lifestyle changes as discussed (low fat, low carb, increased protein diet; improved exercise efforts; weight loss) to control sugar, blood pressure and cholesterol levels and/or reduce risk of developing other medical problems. Look into [email or web address].[email or web address] or other type of food journal to assist you in this process.  Please schedule followup in 6 months for semi annual exam and labs, call sooner if problems.  Health Maintenance, Female A healthy lifestyle and preventative care can promote health and wellness.  Maintain regular health, dental, and eye exams.  Eat a healthy diet. Foods like vegetables, fruits, whole grains, low-fat dairy products, and lean protein foods contain the nutrients you need without too many calories. Decrease your intake of foods high in solid fats, added sugars, and salt. Get information about a proper diet from your caregiver, if necessary.  Regular physical exercise is one of the most important things you can do for your health. Most adults should get at least 150 minutes of moderate-intensity exercise (any activity that increases your heart rate and causes you to sweat) each week. In addition, most adults need muscle-strengthening exercises on 2 or more days a week.   Maintain a healthy weight. The body mass index (BMI)  is a screening tool to identify possible weight problems. It provides an estimate of body fat based on height and weight. Your caregiver can help determine your BMI, and can help you achieve or maintain a healthy weight. For adults 20 years and older:  A BMI below 18.5 is considered underweight.  A BMI of 18.5 to 24.9 is normal.  A BMI of 25 to 29.9 is considered overweight.  A BMI of 30 and above is considered obese.  Maintain normal blood lipids and cholesterol by exercising and minimizing your intake of saturated fat. Eat a balanced diet with plenty of fruits and vegetables. Blood tests for lipids and cholesterol should begin at age 27 and be repeated every 5 years. If your lipid or cholesterol levels are high, you are over 50, or you are a high risk for heart disease, you may need your cholesterol levels checked more frequently.Ongoing high lipid and cholesterol levels should be treated with medicines if diet and exercise are not effective.  If you smoke, find out from your caregiver how to quit. If you do not use tobacco, do not start.  Lung cancer screening is recommended for adults aged 87 80 years who are at high risk for developing lung cancer because of a history of smoking. Yearly low-dose computed tomography (CT) is recommended for people who have at least a 30-pack-year history of smoking and are a current smoker or have quit within the past 15 years. A pack year of smoking is smoking an average of 1 pack of cigarettes a day for 1 year (for example: 1 pack a day for 30 years or 2 packs a day for 15 years).  Yearly screening should continue until the smoker has stopped smoking for at least 15 years. Yearly screening should also be stopped for people who develop a health problem that would prevent them from having lung cancer treatment.  If you are pregnant, do not drink alcohol. If you are breastfeeding, be very cautious about drinking alcohol. If you are not pregnant and choose to drink  alcohol, do not exceed 1 drink per day. One drink is considered to be 12 ounces (355 mL) of beer, 5 ounces (148 mL) of wine, or 1.5 ounces (44 mL) of liquor.  Avoid use of street drugs. Do not share needles with anyone. Ask for help if you need support or instructions about stopping the use of drugs.  High blood pressure causes heart disease and increases the risk of stroke. Blood pressure should be checked at least every 1 to 2 years. Ongoing high blood pressure should be treated with medicines, if weight loss and exercise are not effective.  If you are 20 to 60 years old, ask your caregiver if you should take aspirin to prevent strokes.  Diabetes screening involves taking a blood sample to check your fasting blood sugar level. This should be done once every 3 years, after age 73, if you are within normal weight and without risk factors for diabetes. Testing should be considered at a younger age or be carried out more frequently if you are overweight and have at least 1 risk factor for diabetes.  Breast cancer screening is essential preventative care for women. You should practice "breast self-awareness." This means understanding the normal appearance and feel of your breasts and may include breast self-examination. Any changes detected, no matter how small, should be reported to a caregiver. Women in their 65s and 30s should have a clinical breast exam (CBE) by a caregiver as part of a regular health exam every 1 to 3 years. After age 97, women should have a CBE every year. Starting at age 67, women should consider having a mammogram (breast X-ray) every year. Women who have a family history of breast cancer should talk to their caregiver about genetic screening. Women at a high risk of breast cancer should talk to their caregiver about having an MRI and a mammogram every year.  Breast cancer gene (BRCA)-related cancer risk assessment is recommended for women who have family members with BRCA-related  cancers. BRCA-related cancers include breast, ovarian, tubal, and peritoneal cancers. Having family members with these cancers may be associated with an increased risk for harmful changes (mutations) in the breast cancer genes BRCA1 and BRCA2. Results of the assessment will determine the need for genetic counseling and BRCA1 and BRCA2 testing.  The Pap test is a screening test for cervical cancer. Women should have a Pap test starting at age 54. Between ages 83 and 15, Pap tests should be repeated every 2 years. Beginning at age 54, you should have a Pap test every 3 years as long as the past 3 Pap tests have been normal. If you had a hysterectomy for a problem that was not cancer or a condition that could lead to cancer, then you no longer need Pap tests. If you are between ages 20 and 20, and you have had normal Pap tests going back 10 years, you no longer need Pap tests. If you have had past treatment for cervical cancer or a condition that could lead to cancer, you need Pap tests and screening for cancer for at least 20 years after your  treatment. If Pap tests have been discontinued, risk factors (such as a new sexual partner) need to be reassessed to determine if screening should be resumed. Some women have medical problems that increase the chance of getting cervical cancer. In these cases, your caregiver may recommend more frequent screening and Pap tests.  The human papillomavirus (HPV) test is an additional test that may be used for cervical cancer screening. The HPV test looks for the virus that can cause the cell changes on the cervix. The cells collected during the Pap test can be tested for HPV. The HPV test could be used to screen women aged 100 years and older, and should be used in women of any age who have unclear Pap test results. After the age of 64, women should have HPV testing at the same frequency as a Pap test.  Colorectal cancer can be detected and often prevented. Most routine  colorectal cancer screening begins at the age of 59 and continues through age 92. However, your caregiver may recommend screening at an earlier age if you have risk factors for colon cancer. On a yearly basis, your caregiver may provide home test kits to check for hidden blood in the stool. Use of a small camera at the end of a tube, to directly examine the colon (sigmoidoscopy or colonoscopy), can detect the earliest forms of colorectal cancer. Talk to your caregiver about this at age 50, when routine screening begins. Direct examination of the colon should be repeated every 5 to 10 years through age 69, unless early forms of pre-cancerous polyps or small growths are found.  Hepatitis C blood testing is recommended for all people born from [birth year] through 1967 and any individual with known risks for hepatitis C.  Practice safe sex. Use condoms and avoid high-risk sexual practices to reduce the spread of sexually transmitted infections (STIs). Sexually active women aged 11 and younger should be checked for Chlamydia, which is a common sexually transmitted infection. Older women with new or multiple partners should also be tested for Chlamydia. Testing for other STIs is recommended if you are sexually active and at increased risk.  Osteoporosis is a disease in which the bones lose minerals and strength with aging. This can result in serious bone fractures. The risk of osteoporosis can be identified using a bone density scan. Women ages 17 and over and women at risk for fractures or osteoporosis should discuss screening with their caregivers. Ask your caregiver whether you should be taking a calcium supplement or vitamin D to reduce the rate of osteoporosis.  Menopause can be associated with physical symptoms and risks. Hormone replacement therapy is available to decrease symptoms and risks. You should talk to your caregiver about whether hormone replacement therapy is right for you.  Use sunscreen. Apply  sunscreen liberally and repeatedly throughout the day. You should seek shade when your shadow is shorter than you. Protect yourself by wearing long sleeves, pants, a wide-brimmed hat, and sunglasses year round, whenever you are outdoors.  Notify your caregiver of new moles or changes in moles, especially if there is a change in shape or color. Also notify your caregiver if a mole is larger than the size of a pencil eraser.  Stay current with your immunizations. Document Released: 10/24/2010 Document Revised: 08/05/2012 Document Reviewed: 10/24/2010 [facility] Patient Information 2014 [place].

## 2013-09-12 NOTE — Assessment & Plan Note (Signed)
Weight gain reviewed -  Hx diet controlled  Will recheck a1c today and consider meds as needed The patient is asked to make an attempt to improve diet and exercise patterns to aid in medical management of this problem.   Lab Results  Component Value Date   HGBA1C 6.3 05/06/2012

## 2013-09-12 NOTE — Assessment & Plan Note (Signed)
Overlap with anxiety but reports increasing symptoms now affecting sleep - ?neurologic - will refer for eval of same Changed citalopram to venlafex 11/2010 for hot flashes symptoms exac by social stressors - unemployed, teenage son, elderly parent -  Offered referral to counseling for support - pt declines at this time

## 2013-09-12 NOTE — Assessment & Plan Note (Signed)
continue present plan and medications. Also to work on diet/exercise for weight control  BP Readings from Last 3 Encounters:  09/12/13 120/84  08/26/12 130/92  07/26/12 146/88

## 2013-09-12 NOTE — Progress Notes (Signed)
Pre visit review using our clinic review tool, if applicable. No additional management support is needed unless otherwise documented below in the visit note. 

## 2013-09-12 NOTE — Progress Notes (Signed)
Subjective:    Patient ID: Suzanne [name], female    DOB: [birth year], 58 y.o.   MRN: [id number]  HPI  patient is here today for annual physical. Patient feels well overall  Reviewed chronic medical issues, current concerns and interval medical events  Past Medical History  Diagnosis Date  . Anxiety   . OBESITY   . RAYNAUDS SYNDROME   . Diabetes mellitus, type 2   . HYPERTENSION   . GERD    Family History  Problem Relation Age of Onset  . Lung cancer Father   . Allergies Brother   . Allergies Son   . Lung cancer Maternal Aunt   . Colon cancer Maternal Aunt   . Clotting disorder Other    History  Substance Use Topics  . Smoking status: Never Smoker   . Smokeless tobacco: Not on file  . Alcohol Use: No    Review of Systems  Constitutional: Negative for fever, fatigue and unexpected weight change.  Respiratory: Negative for cough, shortness of breath and wheezing.   Cardiovascular: Negative for chest pain, palpitations and leg swelling.  Gastrointestinal: Negative for nausea, abdominal pain and diarrhea.  Genitourinary: Positive for vaginal discharge (occ none now) and vaginal pain. Negative for urgency, hematuria, genital sores, menstrual problem and pelvic pain.  Skin: Positive for wound (skin crack L foot between 4 and 5th toe). Negative for color change and rash.  Neurological: Negative for dizziness, tremors, syncope, facial asymmetry, weakness, light-headedness, numbness and headaches.       Increase vocal stutter - now stutter and nonsense talking in sleep  Psychiatric/Behavioral: Negative for dysphoric mood. The patient is not nervous/anxious.   All other systems reviewed and are negative.      Objective:   Physical Exam  BP 120/84  Pulse 69  Temp(Src) 97 F (36.1 C) (Oral)  Wt 222 lb (100.699 kg)  SpO2 98% Wt Readings from Last 3 Encounters:  09/12/13 222 lb (100.699 kg)  08/26/12 222 lb (100.699 kg)  07/26/12 222 lb (100.699 kg)    Constitutional: She is obese, but appears well-developed and well-nourished. No distress.  HENT: Head: Normocephalic and atraumatic. Ears: B TMs ok, no erythema or effusion; Nose: Nose normal. Mouth/Throat: Oropharynx is clear and moist. No oropharyngeal exudate.  Eyes: Conjunctivae and EOM are normal. Pupils are equal, round, and reactive to light. No scleral icterus.  Neck: Thick. Normal range of motion. Neck supple. No JVD present. No thyromegaly present.  Cardiovascular: Normal rate, regular rhythm and normal heart sounds.  No murmur heard. No BLE edema. Pulmonary/Chest: Effort normal and breath sounds normal. No respiratory distress. She has no wheezes.  Abdominal: Soft. Bowel sounds are normal. She exhibits no distension. There is no tenderness. no masses GU - defer to gyn Musculoskeletal: Normal range of motion, no joint effusions. No gross deformities Neurological: significant vocal stutter. She is alert and oriented to person, place, and time. No cranial nerve deficit. Coordination, balance, strength, speech and gait are normal.  Skin: Skin is warm and dry. No rash noted. No erythema.  Psychiatric: She has a normal mood and affect. Her behavior is normal. Judgment and thought content normal.     Lab Results  Component Value Date   WBC 4.1* 05/06/2012   HGB 12.0 05/06/2012   HCT 36.2 05/06/2012   PLT 285.0 05/06/2012   GLUCOSE 83 05/06/2012   CHOL 131 07/13/2011   TRIG 47.0 07/13/2011   HDL 43.70 07/13/2011   LDLCALC 78 07/13/2011  ALT 16 05/06/2012   AST 22 05/06/2012   NA 138 05/06/2012   K 3.7 05/06/2012   CL 104 05/06/2012   CREATININE 0.7 05/06/2012   BUN 11 05/06/2012   CO2 28 05/06/2012   TSH 0.53 05/06/2012   HGBA1C 6.3 05/06/2012   MICROALBUR 0.4 07/13/2011    No results found.     Assessment & Plan:   CPX/v70.0 - Patient has been counseled on age-appropriate routine health concerns for screening and prevention. These are reviewed and up-to-date. Immunizations are  up-to-date or declined. Labs ordered and reviewed.  Vaginitis symptoms, recurrent - check urine and refer to gyn  Problem List Items Addressed This Visit   Adult stuttering     Overlap with anxiety but reports increasing symptoms now affecting sleep - ?neurologic - will refer for eval of same Changed citalopram to venlafex 11/2010 for hot flashes symptoms exac by social stressors - unemployed, teenage son, elderly parent -  Offered referral to counseling for support - pt declines at this time    Relevant Orders      Ambulatory referral to Neurology   Diabetes mellitus type 2, diet-controlled      Weight gain reviewed -  Hx diet controlled  Will recheck a1c today and consider meds as needed The patient is asked to make an attempt to improve diet and exercise patterns to aid in medical management of this problem.   Lab Results  Component Value Date   HGBA1C 6.3 05/06/2012      Relevant Orders      Hemoglobin A1c      Microalbumin / creatinine urine ratio      DME Other see comment   HYPERTENSION      continue present plan and medications. Also to work on diet/exercise for weight control  BP Readings from Last 3 Encounters:  09/12/13 120/84  08/26/12 130/92  07/26/12 146/88      OBESITY       Wt Readings from Last 3 Encounters:  09/12/13 222 lb (100.699 kg)  08/26/12 222 lb (100.699 kg)  07/26/12 222 lb (100.699 kg)  The patient is asked to make an attempt to improve diet and exercise patterns to aid in medical management of this problem.     Relevant Orders      DME Other see comment    Other Visit Diagnoses   Routine general medical examination at a health care facility    -  Primary    Relevant Orders       Basic metabolic panel       CBC with Differential       Hepatic function panel       Lipid panel       TSH    Vaginitis        Relevant Orders       Ambulatory referral to Gynecology

## 2013-09-12 NOTE — Assessment & Plan Note (Signed)
  Wt Readings from Last 3 Encounters:  09/12/13 222 lb (100.699 kg)  08/26/12 222 lb (100.699 kg)  07/26/12 222 lb (100.699 kg)  The patient is asked to make an attempt to improve diet and exercise patterns to aid in medical management of this problem.

## 2013-09-30 ENCOUNTER — Ambulatory Visit (INDEPENDENT_AMBULATORY_CARE_PROVIDER_SITE_OTHER): Payer: BC Managed Care – PPO | Admitting: Neurology

## 2013-09-30 ENCOUNTER — Encounter: Payer: Self-pay | Admitting: Neurology

## 2013-09-30 VITALS — BP 136/82 | HR 68 | Temp 98.3°F | Resp 18 | Ht 65.0 in | Wt 225.6 lb

## 2013-09-30 DIAGNOSIS — F329 Major depressive disorder, single episode, unspecified: Secondary | ICD-10-CM

## 2013-09-30 DIAGNOSIS — F3289 Other specified depressive episodes: Secondary | ICD-10-CM

## 2013-09-30 DIAGNOSIS — F32A Depression, unspecified: Secondary | ICD-10-CM

## 2013-09-30 DIAGNOSIS — F8081 Childhood onset fluency disorder: Secondary | ICD-10-CM

## 2013-09-30 NOTE — Patient Instructions (Signed)
I think the stuttering and the talking in the sleep is related to the anxiety and depression.  Your neurologic exam is normal, which is reassurrng.  I think the best management would be to see a psychologist and speech therapist.

## 2013-09-30 NOTE — Progress Notes (Signed)
NEUROLOGY CONSULTATION NOTE  Beckie BusingCallie O Smiles MRN: [id number] DOB: 09-11-1955  Referring provider: Dr. Felicity CoyerLeschber Primary care provider: Dr. Felicity CoyerLeschber  Reason for consult:  Stuttering, talking in her sleep  HISTORY OF PRESENT ILLNESS: Suzanne [name] is a 58 year old right-handed woman with history of hypertension, type II diabetes, and anxiety who presents for stuttering.  Records personally reviewed.  She was diagnosed with stuttering as a child. At that time, she didn't receive speech therapy. She noted worsening of her stuttering since March of 2013, after she was terminated from her job. She worked as an Data processing managereducation director for 33 years. She was told that the company was downsizing. This caused a significant blow to her self-esteem and she felt ashamed and couldn't face people. She has not found any other employment. She feels that she cannot go out and find a job, particularly due to her age and her qualifications. She she tries to keep busy, such as working in the yard which she finds enjoyable. Due to her her worsening stuttering, she feels that she cannot perform certain job such as Agricultural consultantteaching. She denies word finding difficulties. She sometimes stutters if she read out loud but she has no problems when she is reading silently. She denies any language impairment. She denies any focal numbness or weakness. She does take Effexor for depression and anxiety, but she doesn't feel that it is been effective. She is hesitant about seeing a psychologist or going to speech therapy to financial restraints, since her husband is the only one making an income. Her husband has told her that she also talks in her sleep more often. Sometimes she seems more angry and curses in her sleep. She is restless in her sleep but there is no significant thrashing around to suggest REM sleep behavior disorder.  PAST MEDICAL HISTORY: Past Medical History  Diagnosis Date  . Anxiety   . OBESITY   .  RAYNAUDS SYNDROME   . Diabetes mellitus, type 2   . HYPERTENSION   . GERD     PAST SURGICAL HISTORY: Past Surgical History  Procedure Laterality Date  . Cholecystectomy  2000    MEDICATIONS: Current Outpatient Prescriptions on File Prior to Visit  Medication Sig Dispense Refill  . ketoconazole (NIZORAL) 2 % cream Apply 1 application topically daily.  15 g  0  . metoprolol succinate (TOPROL-XL) 100 MG 24 hr tablet TAKE ONE TABLET BY MOUTH ONCE DAILY WITH OR  IMMEDIATELY  FOLLOWING  A  MEAL  90 tablet  0  . venlafaxine XR (EFFEXOR-XR) 150 MG 24 hr capsule TAKE ONE CAPSULE BY MOUTH EVERY DAY  90 capsule  3   No current facility-administered medications on file prior to visit.    ALLERGIES: Allergies  Allergen Reactions  . Other     ALL SEAFOOD  . Shrimp [Shellfish Allergy]     Pt states she allergic to seafood    FAMILY HISTORY: Family History  Problem Relation Age of Onset  . Lung cancer Father   . Allergies Brother   . Allergies Son   . Lung cancer Maternal Aunt   . Colon cancer Maternal Aunt   . Clotting disorder Other     SOCIAL HISTORY: History   Social History  . Marital Status: Married    Spouse Name: N/A    Number of Children: N/A  . Years of Education: N/A   Occupational History  . Not on file.   Social History Main Topics  . Smoking status: Never  Smoker   . Smokeless tobacco: Not on file  . Alcohol Use: No  . Drug Use: No  . Sexual Activity: Yes    Partners: Male     Comment: [place] Has worked before in [employer]. Data processing manager for [employer]. Lives with her husband and 16y son,and dog   Other Topics Concern  . Not on file   Social History Narrative   Out of work 06/2011     REVIEW OF SYSTEMS: Constitutional: No fevers, chills, or sweats, no generalized fatigue, change in appetite Eyes: No visual changes, double vision, eye pain Ear, nose and throat: No hearing loss, ear pain, nasal congestion, sore  throat Cardiovascular: No chest pain, palpitations Respiratory:  No shortness of breath at rest or with exertion, wheezes GastrointestinaI: No nausea, vomiting, diarrhea, abdominal pain, fecal incontinence Genitourinary:  No dysuria, urinary retention or frequency Musculoskeletal:  No neck pain, back pain Integumentary: No rash, pruritus, skin lesions Neurological: as above Psychiatric: Depression Endocrine: No palpitations, fatigue, diaphoresis, mood swings, change in appetite, change in weight, increased thirst Hematologic/Lymphatic:  No anemia, purpura, petechiae. Allergic/Immunologic: no itchy/runny eyes, nasal congestion, recent allergic reactions, rashes  PHYSICAL EXAM: Filed Vitals:   09/30/13 0809  BP: 136/82  Pulse: 68  Temp: 98.3 F (36.8 C)  Resp: 18   General: No acute distress.  Depressed.  Tearful Head:  Normocephalic/atraumatic Neck: supple, no paraspinal tenderness, full range of motion Back: No paraspinal tenderness Heart: regular rate and rhythm Lungs: Clear to auscultation bilaterally. Vascular: No carotid bruits. Neurological Exam: Mental status: alert and oriented to person, place, and time, recent and remote memory intact, fund of knowledge intact, attention and concentration intact, speech fluent with occasional stuttering of words (particularly when she gets anxious talking about losing her job) and not dysarthric, language intact. Cranial nerves: CN I: not tested CN II: pupils equal, round and reactive to light, visual fields intact, fundi unremarkable, without vessel changes, exudates, hemorrhages or papilledema. CN III, IV, VI:  full range of motion, no nystagmus, no ptosis CN V: facial sensation intact CN VII: upper and lower face symmetric CN VIII: hearing intact CN IX, X: gag intact, uvula midline CN XI: sternocleidomastoid and trapezius muscles intact CN XII: tongue midline Bulk & Tone: normal, no fasciculations. Motor: 5 out of 5  throughout Sensation: Temperature and vibration intact Deep Tendon Reflexes: 2+ throughout, toes downgoing Finger to nose testing: No tremor or dysmetria Heel to shin: No dysmetria Gait: Normal station and stride. Able to turn and walk in tandem. Romberg negative.  IMPRESSION: Stuttering.  Likely related to depression.  Neurological exam non-focal  PLAN: 1.  Recommend referral to behavioral medicine 2.  Recommend referral to speech therapy.  Consults placed. 3.  No further neurological workup or follow up warranted at this time.  Thank you for allowing me to take part in the care of this patient.  Shon Millet, DO  CC:  Rene Paci, MD

## 2013-10-03 ENCOUNTER — Ambulatory Visit: Payer: Self-pay | Admitting: Women's Health

## 2013-10-09 ENCOUNTER — Ambulatory Visit (INDEPENDENT_AMBULATORY_CARE_PROVIDER_SITE_OTHER): Payer: BC Managed Care – PPO | Admitting: *Deleted

## 2013-10-09 ENCOUNTER — Telehealth: Payer: Self-pay | Admitting: Internal Medicine

## 2013-10-09 DIAGNOSIS — E119 Type 2 diabetes mellitus without complications: Secondary | ICD-10-CM

## 2013-10-09 LAB — GLUCOSE, POCT (MANUAL RESULT ENTRY): POC Glucose: 107 mg/dl — AB (ref 70–99)

## 2013-10-09 MED ORDER — VENLAFAXINE HCL ER 150 MG PO CP24: 150.0000 mg | ORAL_CAPSULE | Freq: Every day | ORAL | Status: DC

## 2013-10-09 NOTE — Telephone Encounter (Signed)
erx done

## 2013-10-09 NOTE — Telephone Encounter (Signed)
Pt lost her venlafaxine on vacation and is requesting a refill.  Pharmacy is [facility] on [address].

## 2013-10-10 ENCOUNTER — Encounter: Payer: Self-pay | Admitting: Women's Health

## 2013-10-10 ENCOUNTER — Ambulatory Visit (INDEPENDENT_AMBULATORY_CARE_PROVIDER_SITE_OTHER): Payer: BC Managed Care – PPO | Admitting: Women's Health

## 2013-10-10 VITALS — BP 120/78 | Ht 63.0 in | Wt 220.0 lb

## 2013-10-10 DIAGNOSIS — N9489 Other specified conditions associated with female genital organs and menstrual cycle: Secondary | ICD-10-CM

## 2013-10-10 DIAGNOSIS — N898 Other specified noninflammatory disorders of vagina: Secondary | ICD-10-CM

## 2013-10-10 DIAGNOSIS — Z780 Asymptomatic menopausal state: Secondary | ICD-10-CM

## 2013-10-10 DIAGNOSIS — Z01419 Encounter for gynecological examination (general) (routine) without abnormal findings: Secondary | ICD-10-CM

## 2013-10-10 LAB — WET PREP FOR TRICH, YEAST, CLUE
Clue Cells Wet Prep HPF POC: NONE SEEN
Trich, Wet Prep: NONE SEEN
Yeast Wet Prep HPF POC: NONE SEEN

## 2013-10-10 NOTE — Progress Notes (Signed)
[name] Merriott [birth year] [id number]    History:    Presents for annual exam.  Postmenopausal/no HRT/no bleeding. Normal Pap and mammogram history. Primary care manages labs and meds for hypertension. History of hepatitis B. Reports occasional vaginal odor. Stutters. Negative colonoscopy several years ago.  Past medical history, past surgical history, family history and social history were all reviewed and documented in the EPIC chart. Worked at [employer] 33 years, position eliminated. 60 year old son [place] [employer].  ROS:  A  12 point ROS was performed and pertinent positives and negatives are included.  Exam:  Filed Vitals:   10/10/13 1511  BP: 120/78    General appearance:  Normal Thyroid:  Symmetrical, normal in size, without palpable masses or nodularity. Respiratory  Auscultation:  Clear without wheezing or rhonchi Cardiovascular  Auscultation:  Regular rate, without rubs, murmurs or gallops  Edema/varicosities:  Not grossly evident Abdominal  Soft,nontender, without masses, guarding or rebound.  Liver/spleen:  No organomegaly noted  Hernia:  None appreciated  Skin  Inspection:  Grossly normal   Breasts: Examined lying and sitting.     Right: Without masses, retractions, discharge or axillary adenopathy.     Left: Without masses, retractions, discharge or axillary adenopathy. Gentitourinary   Inguinal/mons:  Normal without inguinal adenopathy  External genitalia:  Normal  BUS/Urethra/Skene's glands:  Normal  Vagina:  Normal wet prep negative  Cervix:  Normal  Uterus:  normal in size, shape and contour.  Midline and mobile  Adnexa/parametria:     Rt: Without masses or tenderness.   Lt: Without masses or tenderness.  Anus and perineum: Normal  Digital rectal exam: Normal sphincter tone without palpated masses or tenderness  Assessment/Plan:  58 y.o. MBF G1P1 for annual exam.    Normal postmenopausal exam/no HRT/no  bleeding Hypertension-primary care manages labs and meds  Plan: SBE's, schedule annual mammogram, overdue, reviewed importance of screening. Continue regular exercise water aroebics, zumba, walking. Calcium rich diet, vitamin D 2000 daily encouraged. Has not had a DEXA will schedule. Pap normal 2014, new screening guidelines reviewed. Reviewed normality of wet prep.  Note: This dictation was prepared with Dragon/digital dictation.  Any transcriptional errors that result are unintentional. Harrington ChallengerYOUNG,NANC[facility] J Glancyrehabilitation HospitalWHNP, 4:02 PM 10/10/2013

## 2013-10-10 NOTE — Telephone Encounter (Signed)
Pt is aware.  

## 2013-10-10 NOTE — Patient Instructions (Signed)
Health Recommendations for Postmenopausal Women Respected and ongoing research has looked at the most common causes of death, disability, and poor quality of life in postmenopausal women. The causes include heart disease, diseases of blood vessels, diabetes, depression, cancer, and bone loss (osteoporosis). Many things can be done to help lower the chances of developing these and other common problems: CARDIOVASCULAR DISEASE Heart Disease: A heart attack is a medical emergency. Know the signs and symptoms of a heart attack. Below are things women can do to reduce their risk for heart disease.   Do not smoke. If you smoke, quit.  Aim for a healthy weight. Being overweight causes many preventable deaths. Eat a healthy and balanced diet and drink an adequate amount of liquids.  Get moving. Make a commitment to be more physically active. Aim for 30 minutes of activity on most, if not all days of the week.  Eat for heart health. Choose a diet that is low in saturated fat and cholesterol and eliminate trans fat. Include whole grains, vegetables, and fruits. Read and understand the labels on food containers before buying.  Know your numbers. Ask your caregiver to check your blood pressure, cholesterol (total, HDL, LDL, triglycerides) and blood glucose. Work with your caregiver on improving your entire clinical picture.  High blood pressure. Limit or stop your table salt intake (try salt substitute and food seasonings). Avoid salty foods and drinks. Read labels on food containers before buying. Eating well and exercising can help control high blood pressure. STROKE  Stroke is a medical emergency. Stroke may be the result of a blood clot in a blood vessel in the brain or by a brain hemorrhage (bleeding). Know the signs and symptoms of a stroke. To lower the risk of developing a stroke:  Avoid fatty foods.  Quit smoking.  Control your diabetes, blood pressure, and irregular heart rate. THROMBOPHLEBITIS  (BLOOD CLOT) OF THE LEG  Becoming overweight and leading a stationary lifestyle may also contribute to developing blood clots. Controlling your diet and exercising will help lower the risk of developing blood clots. CANCER SCREENING  Breast Cancer: Take steps to reduce your risk of breast cancer.  You should practice "breast self-awareness." This means understanding the normal appearance and feel of your breasts and should include breast self-examination. Any changes detected, no matter how small, should be reported to your caregiver.  After age 40, you should have a clinical breast exam (CBE) every year.  Starting at age 40, you should consider having a mammogram (breast X-ray) every year.  If you have a family history of breast cancer, talk to your caregiver about genetic screening.  If you are at high risk for breast cancer, talk to your caregiver about having an MRI and a mammogram every year.  Intestinal or Stomach Cancer: Tests to consider are a rectal exam, fecal occult blood, sigmoidoscopy, and colonoscopy. Women who are high risk may need to be screened at an earlier age and more often.  Cervical Cancer:  Beginning at age 30, you should have a Pap test every 3 years as long as the past 3 Pap tests have been normal.  If you have had past treatment for cervical cancer or a condition that could lead to cancer, you need Pap tests and screening for cancer for at least 20 years after your treatment.  If you had a hysterectomy for a problem that was not cancer or a condition that could lead to cancer, then you no longer need Pap tests.    If you are between ages 65 and 70, and you have had normal Pap tests going back 10 years, you no longer need Pap tests.  If Pap tests have been discontinued, risk factors (such as a new sexual partner) need to be reassessed to determine if screening should be resumed.  Some medical problems can increase the chance of getting cervical cancer. In these  cases, your caregiver may recommend more frequent screening and Pap tests.  Uterine Cancer: If you have vaginal bleeding after reaching menopause, you should notify your caregiver.  Ovarian cancer: Other than yearly pelvic exams, there are no reliable tests available to screen for ovarian cancer at this time except for yearly pelvic exams.  Lung Cancer: Yearly chest X-rays can detect lung cancer and should be done on high risk women, such as cigarette smokers and women with chronic lung disease (emphysema).  Skin Cancer: A complete body skin exam should be done at your yearly examination. Avoid overexposure to the sun and ultraviolet light lamps. Use a strong sun block cream when in the sun. All of these things are important in lowering the risk of skin cancer. MENOPAUSE Menopause Symptoms: Hormone therapy products are effective for treating symptoms associated with menopause:  Moderate to severe hot flashes.  Night sweats.  Mood swings.  Headaches.  Tiredness.  Loss of sex drive.  Insomnia.  Other symptoms. Hormone replacement carries certain risks, especially in older women. Women who use or are thinking about using estrogen or estrogen with progestin treatments should discuss that with their caregiver. Your caregiver will help you understand the benefits and risks. The ideal dose of hormone replacement therapy is not known. The Food and Drug Administration (FDA) has concluded that hormone therapy should be used only at the lowest doses and for the shortest amount of time to reach treatment goals.  OSTEOPOROSIS Protecting Against Bone Loss and Preventing Fracture: If you use hormone therapy for prevention of bone loss (osteoporosis), the risks for bone loss must outweigh the risk of the therapy. Ask your caregiver about other medications known to be safe and effective for preventing bone loss and fractures. To guard against bone loss or fractures, the following is recommended:  If  you are less than age 50, take 1000 mg of calcium and at least 600 mg of Vitamin D per day.  If you are greater than age 50 but less than age 70, take 1200 mg of calcium and at least 600 mg of Vitamin D per day.  If you are greater than age 70, take 1200 mg of calcium and at least 800 mg of Vitamin D per day. Smoking and excessive alcohol intake increases the risk of osteoporosis. Eat foods rich in calcium and vitamin D and do weight bearing exercises several times a week as your caregiver suggests. DIABETES Diabetes Melitus: If you have Type I or Type 2 diabetes, you should keep your blood sugar under control with diet, exercise and recommended medication. Avoid too many sweets, starchy and fatty foods. Being overweight can make control more difficult. COGNITION AND MEMORY Cognition and Memory: Menopausal hormone therapy is not recommended for the prevention of cognitive disorders such as Alzheimer's disease or memory loss.  DEPRESSION  Depression may occur at any age, but is common in elderly women. The reasons may be because of physical, medical, social (loneliness), or financial problems and needs. If you are experiencing depression because of medical problems and control of symptoms, talk to your caregiver about this. Physical activity and   exercise may help with mood and sleep. Community and volunteer involvement may help your sense of value and worth. If you have depression and you feel that the problem is getting worse or becoming severe, talk to your caregiver about treatment options that are best for you. ACCIDENTS  Accidents are common and can be serious in the elderly woman. Prepare your house to prevent accidents. Eliminate throw rugs, place hand bars in the bath, shower and toilet areas. Avoid wearing high heeled shoes or walking on wet, snowy, and icy areas. Limit or stop driving if you have vision or hearing problems, or you feel you are unsteady with you movements and  reflexes. HEPATITIS C Hepatitis C is a type of viral infection affecting the liver. It is spread mainly through contact with blood from an infected person. It can be treated, but if left untreated, it can lead to severe liver damage over years. Many people who are infected do not know that the virus is in their blood. If you are a "baby-boomer", it is recommended that you have one screening test for Hepatitis C. IMMUNIZATIONS  Several immunizations are important to consider having during your senior years, including:   Tetanus, diptheria, and pertussis booster shot.  Influenza every year before the flu season begins.  Pneumonia vaccine.  Shingles vaccine.  Others as indicated based on your specific needs. Talk to your caregiver about these. Document Released: 06/02/2005 Document Revised: 03/27/2012 Document Reviewed: 01/27/2008 ExitCare Patient Information 2015 [employer]. This information is not intended to replace advice given to you by your health care provider. Make sure you discuss any questions you have with your health care provider.  

## 2013-10-27 ENCOUNTER — Other Ambulatory Visit: Payer: Self-pay | Admitting: Gynecology

## 2013-10-27 DIAGNOSIS — Z780 Asymptomatic menopausal state: Secondary | ICD-10-CM

## 2013-10-27 DIAGNOSIS — Z13820 Encounter for screening for osteoporosis: Secondary | ICD-10-CM

## 2013-11-04 NOTE — Progress Notes (Signed)
Nurse visit only

## 2013-11-11 ENCOUNTER — Ambulatory Visit (INDEPENDENT_AMBULATORY_CARE_PROVIDER_SITE_OTHER): Payer: BC Managed Care – PPO

## 2013-11-11 DIAGNOSIS — Z13820 Encounter for screening for osteoporosis: Secondary | ICD-10-CM

## 2013-11-11 DIAGNOSIS — Z780 Asymptomatic menopausal state: Secondary | ICD-10-CM

## 2013-11-12 ENCOUNTER — Other Ambulatory Visit: Payer: Self-pay | Admitting: Internal Medicine

## 2013-12-15 ENCOUNTER — Encounter: Payer: Self-pay | Admitting: Internal Medicine

## 2013-12-15 ENCOUNTER — Ambulatory Visit (INDEPENDENT_AMBULATORY_CARE_PROVIDER_SITE_OTHER): Payer: BC Managed Care – PPO | Admitting: Internal Medicine

## 2013-12-15 VITALS — BP 140/84 | HR 73 | Temp 98.1°F | Wt 226.5 lb

## 2013-12-15 DIAGNOSIS — R109 Unspecified abdominal pain: Secondary | ICD-10-CM

## 2013-12-15 DIAGNOSIS — R1032 Left lower quadrant pain: Secondary | ICD-10-CM

## 2013-12-15 MED ORDER — TRAMADOL HCL 50 MG PO TABS: 50.0000 mg | ORAL_TABLET | Freq: Three times a day (TID) | ORAL | Status: DC | PRN

## 2013-12-15 NOTE — Patient Instructions (Signed)
Fill tub with hot water and soak in it twice a day to help relieve the soft tissue/musculoskeletal pain. Glucosamine sulfate is NOT an option due to shellfish allergy. I recommend you see Dr Terrilee Files, Sports Medicine specialist., phone # [phone] if no better.

## 2013-12-15 NOTE — Progress Notes (Signed)
Pre visit review using our clinic review tool, if applicable. No additional management support is needed unless otherwise documented below in the visit note. 

## 2013-12-15 NOTE — Progress Notes (Signed)
   Subjective:    Patient ID: Suzanne [name], female    DOB: 06/23/1955, 58 y.o.   MRN: [id number]  HPI    She sustained an injury to the left inguinal/left lower quadrant while doing water based exercises. She was in the water supported by the teacher with her legs extended straight out with soles flat against the wall of the pool. She would then raise her weight by pulling on the pool ladder.  The pain is intermittent and can radiate into the left lower quadrant.  It has been worse at night. Also flexing the left leg  & bending from the waist aggravates her symptoms.  Aleve has been of benefit. She's also used topical agents and BC powders.  She specifically denies any genitourinary or GI symptoms except for occasional mild dysphagia.    Review of Systems Unexplained weight loss, abdominal pain, significant dyspepsia, melena, rectal bleeding, or persistently small caliber stools are denied. Dysuria, pyuria, hematuria, frequency, nocturia or polyuria are denied.     Objective:   Physical Exam  Pertinent positive findings include: She has pain with raising the knee to hip level. She also has pain with ROM of the hip.  As per CDC Guidelines ,Epic documents severe obesity as being present .  General appearance :adequately nourished; in no distress. Eyes: No conjunctival inflammation or scleral icterus is present. Heart:  Normal rate and regular rhythm. S1 and S2 normal without gallop, murmur, click, rub or other extra sounds   Lungs:Chest clear to auscultation; no wheezes, rhonchi,rales ,or rubs present.No increased work of breathing.  Abdomen: bowel sounds normal, soft and non-tender without masses, organomegaly or hernias noted.  No guarding or rebound. No flank tenderness to percussion. Skin:Warm & dry.  Intact without suspicious lesions or rashes ; no jaundice or tenting Lymphatic: No lymphadenopathy is noted about the head or neck            Assessment & Plan:   #1 ligamentous/muscular strain  Plan: See orders and recommendations

## 2014-02-23 ENCOUNTER — Encounter: Payer: Self-pay | Admitting: Internal Medicine

## 2014-09-15 ENCOUNTER — Ambulatory Visit (INDEPENDENT_AMBULATORY_CARE_PROVIDER_SITE_OTHER): Payer: BC Managed Care – PPO | Admitting: Family Medicine

## 2014-09-15 ENCOUNTER — Encounter: Payer: Self-pay | Admitting: Family Medicine

## 2014-09-15 VITALS — BP 110/70 | HR 84 | Temp 98.6°F | Wt 230.0 lb

## 2014-09-15 DIAGNOSIS — L0292 Furuncle, unspecified: Secondary | ICD-10-CM

## 2014-09-15 MED ORDER — DOXYCYCLINE HYCLATE 100 MG PO CAPS: 100.0000 mg | ORAL_CAPSULE | Freq: Two times a day (BID) | ORAL | Status: AC

## 2014-09-15 NOTE — Progress Notes (Signed)
Pre visit review using our clinic review tool, if applicable. No additional management support is needed unless otherwise documented below in the visit note. 

## 2014-09-16 NOTE — Progress Notes (Signed)
   Subjective:    Patient ID: Suzanne [name], female    DOB: 06-Nov-1955, 59 y.o.   MRN: [id number]  HPI Here for one week of a tender boil on the left abdomen. No fever. This opened and drained some bloody fluid yesterday. She has never had a boil before.    Review of Systems  Constitutional: Negative.   Skin: Positive for wound.       Objective:   Physical Exam  Constitutional: She appears well-developed and well-nourished.  Skin:  The left side of the abdomen has a slightly tender boil which is almost flat and no fluid can be expressed from it          Assessment & Plan:  Treat with Doxycycline and warm compresses.

## 2014-09-22 ENCOUNTER — Ambulatory Visit: Payer: BC Managed Care – PPO | Admitting: Family Medicine

## 2014-10-21 ENCOUNTER — Other Ambulatory Visit: Payer: Self-pay | Admitting: Internal Medicine

## 2014-10-28 ENCOUNTER — Ambulatory Visit (INDEPENDENT_AMBULATORY_CARE_PROVIDER_SITE_OTHER): Payer: BC Managed Care – PPO | Admitting: Internal Medicine

## 2014-10-28 ENCOUNTER — Encounter: Payer: Self-pay | Admitting: Internal Medicine

## 2014-10-28 ENCOUNTER — Telehealth: Payer: Self-pay

## 2014-10-28 ENCOUNTER — Ambulatory Visit (INDEPENDENT_AMBULATORY_CARE_PROVIDER_SITE_OTHER)
Admission: RE | Admit: 2014-10-28 | Discharge: 2014-10-28 | Disposition: A | Discharge status: Home / Self Care | Payer: BC Managed Care – PPO | Source: Ambulatory Visit | Attending: Internal Medicine | Admitting: Internal Medicine

## 2014-10-28 ENCOUNTER — Other Ambulatory Visit (INDEPENDENT_AMBULATORY_CARE_PROVIDER_SITE_OTHER): Payer: BC Managed Care – PPO

## 2014-10-28 VITALS — BP 122/86 | HR 68 | Temp 97.7°F | Wt 228.0 lb

## 2014-10-28 DIAGNOSIS — E1165 Type 2 diabetes mellitus with hyperglycemia: Secondary | ICD-10-CM

## 2014-10-28 DIAGNOSIS — E119 Type 2 diabetes mellitus without complications: Secondary | ICD-10-CM

## 2014-10-28 DIAGNOSIS — R1031 Right lower quadrant pain: Secondary | ICD-10-CM | POA: Diagnosis not present

## 2014-10-28 DIAGNOSIS — R1032 Left lower quadrant pain: Secondary | ICD-10-CM

## 2014-10-28 LAB — HEPATIC FUNCTION PANEL
ALT: 11 U/L (ref 0–35)
AST: 17 U/L (ref 0–37)
Albumin: 3.6 g/dL (ref 3.5–5.2)
Alkaline Phosphatase: 99 U/L (ref 39–117)
Bilirubin, Direct: 0.1 mg/dL (ref 0.0–0.3)
Total Bilirubin: 0.4 mg/dL (ref 0.2–1.2)
Total Protein: 7.1 g/dL (ref 6.0–8.3)

## 2014-10-28 LAB — URINALYSIS, ROUTINE W REFLEX MICROSCOPIC
Ketones, ur: NEGATIVE
Nitrite: NEGATIVE
Specific Gravity, Urine: >=1.03 — AB (ref 1.000–1.030)
Total Protein, Urine: NEGATIVE
Urine Glucose: NEGATIVE
Urobilinogen, UA: 0.2 (ref 0.0–1.0)
pH: 6 (ref 5.0–8.0)

## 2014-10-28 LAB — BASIC METABOLIC PANEL
BUN: 10 mg/dL (ref 6–23)
CO2: 29 mEq/L (ref 19–32)
Calcium: 9 mg/dL (ref 8.4–10.5)
Chloride: 105 mEq/L (ref 96–112)
Creatinine, Ser: 0.69 mg/dL (ref 0.40–1.20)
GFR: 111.24 mL/min (ref >60.00)
Glucose, Bld: 92 mg/dL (ref 70–99)
Potassium: 3.9 mEq/L (ref 3.5–5.1)
Sodium: 140 mEq/L (ref 135–145)

## 2014-10-28 LAB — CBC WITH DIFFERENTIAL/PLATELET
Basophils Absolute: 0 10*3/uL (ref 0.0–0.1)
Basophils Relative: 1 % (ref 0.0–3.0)
Eosinophils Absolute: 0.2 10*3/uL (ref 0.0–0.7)
Eosinophils Relative: 4.9 % (ref 0.0–5.0)
HCT: 38.1 % (ref 36.0–46.0)
Hemoglobin: 12.6 g/dL (ref 12.0–15.0)
Lymphocytes Relative: 30.6 % (ref 12.0–46.0)
Lymphs Abs: 1.2 10*3/uL (ref 0.7–4.0)
MCHC: 33.1 g/dL (ref 30.0–36.0)
MCV: 86.1 fl (ref 78.0–100.0)
Monocytes Absolute: 0.4 10*3/uL (ref 0.1–1.0)
Monocytes Relative: 9 % (ref 3.0–12.0)
Neutro Abs: 2.1 10*3/uL (ref 1.4–7.7)
Neutrophils Relative %: 54.5 % (ref 43.0–77.0)
Platelets: 285 10*3/uL (ref 150.0–400.0)
RBC: 4.42 Mil/uL (ref 3.87–5.11)
RDW: 15.6 % — ABNORMAL HIGH (ref 11.5–15.5)
WBC: 3.9 10*3/uL — ABNORMAL LOW (ref 4.0–10.5)

## 2014-10-28 LAB — POCT GLUCOSE (DEVICE FOR HOME USE): POC Glucose: 124 mg/dl — AB (ref 70–99)

## 2014-10-28 LAB — SEDIMENTATION RATE: Sed Rate: 28 mm/hr — ABNORMAL HIGH (ref 0–22)

## 2014-10-28 MED ORDER — IOHEXOL 300 MG/ML  SOLN
100.0000 mL | Freq: Once | INTRAMUSCULAR | Status: AC | PRN
  Administered 2014-10-28: 100 mL via INTRAVENOUS

## 2014-10-28 MED ORDER — TRAMADOL HCL 50 MG PO TABS: 50.0000 mg | ORAL_TABLET | Freq: Three times a day (TID) | ORAL | Status: DC | PRN

## 2014-10-28 NOTE — Progress Notes (Signed)
Pre visit review using our clinic review tool, if applicable. No additional management support is needed unless otherwise documented below in the visit note. 

## 2014-10-28 NOTE — Patient Instructions (Signed)
Go to ER if worse 

## 2014-10-28 NOTE — Assessment & Plan Note (Signed)
CBG is ok

## 2014-10-28 NOTE — Progress Notes (Signed)
Subjective:  Patient ID: Suzanne [name], female    DOB: 06/24/1955  Age: 59 y.o. MRN: [id number]  CC: No chief complaint on file.   HPI Suzanne [name] presents for RLQ abd pain x x3-4 d -- 8/10 last night. She is constipated. C/o sweating. C/o some nausea  Outpatient Prescriptions Prior to Visit  Medication Sig Dispense Refill  . ketoconazole (NIZORAL) 2 % cream Apply 1 application topically daily. 15 g 0  . metoprolol succinate (TOPROL-XL) 100 MG 24 hr tablet TAKE ONE TABLET BY MOUTH ONCE DAILY WITH OR  IMMEDIATELY  FOLLOWING  A  MEAL 90 tablet 3  . venlafaxine XR (EFFEXOR-XR) 150 MG 24 hr capsule Take 1 capsule (150 mg total) by mouth daily with breakfast. 90 capsule 3  . traMADol (ULTRAM) 50 MG tablet Take 1 tablet (50 mg total) by mouth every 8 (eight) hours as needed. (Patient not taking: Reported on 10/28/2014) 30 tablet 0   No facility-administered medications prior to visit.    ROS Review of Systems  Constitutional: Positive for diaphoresis. Negative for fever, chills, activity change, appetite change, fatigue and unexpected weight change.  HENT: Negative for congestion, mouth sores and sinus pressure.   Eyes: Negative for visual disturbance.  Respiratory: Negative for cough and chest tightness.   Gastrointestinal: Positive for nausea and abdominal pain. Negative for vomiting, diarrhea, blood in stool, abdominal distention and anal bleeding.  Genitourinary: Negative for frequency, difficulty urinating and vaginal pain.  Musculoskeletal: Negative for back pain and gait problem.  Skin: Negative for pallor and rash.  Neurological: Negative for dizziness, tremors, weakness, numbness and headaches.  Psychiatric/Behavioral: Negative for confusion and sleep disturbance.    Objective:  BP 122/86 mmHg  Pulse 68  Temp(Src) 97.7 F (36.5 C)  Wt 228 lb (103.42 kg)  SpO2 98%  BP Readings from Last 3 Encounters:  10/28/14 122/86  09/15/14 110/70  12/15/13  140/84    Wt Readings from Last 3 Encounters:  10/28/14 228 lb (103.42 kg)  09/15/14 230 lb (104.327 kg)  12/15/13 226 lb 8 oz (102.74 kg)    Physical Exam  Constitutional: She appears well-developed. No distress.  Obese  HENT:  Head: Normocephalic.  Right Ear: External ear normal.  Left Ear: External ear normal.  Nose: Nose normal.  Mouth/Throat: Oropharynx is clear and moist.  Eyes: Conjunctivae are normal. Pupils are equal, round, and reactive to light. Right eye exhibits no discharge. Left eye exhibits no discharge.  Neck: Normal range of motion. Neck supple. No JVD present. No tracheal deviation present. No thyromegaly present.  Cardiovascular: Normal rate, regular rhythm and normal heart sounds.   Pulmonary/Chest: No stridor. No respiratory distress. She has no wheezes.  Abdominal: Soft. Bowel sounds are normal. She exhibits no distension and no mass. There is tenderness. There is no rebound and no guarding.  Musculoskeletal: She exhibits no edema or tenderness.  Lymphadenopathy:    She has no cervical adenopathy.  Neurological: She displays normal reflexes. No cranial nerve deficit. She exhibits normal muscle tone. Coordination normal.  Skin: No rash noted. No erythema.  Psychiatric: She has a normal mood and affect. Her behavior is normal. Judgment and thought content normal.  RLQ is sensitive  Lab Results  Component Value Date   WBC 5.8 09/12/2013   HGB 12.4 09/12/2013   HCT 37.9 09/12/2013   PLT 309.0 09/12/2013   GLUCOSE 79 09/12/2013   CHOL 149 09/12/2013   TRIG 46.0 09/12/2013   HDL 48.10 09/12/2013  LDLCALC 92 09/12/2013   ALT 18 09/12/2013   AST 22 09/12/2013   NA 138 09/12/2013   K 3.8 09/12/2013   CL 100 09/12/2013   CREATININE 0.8 09/12/2013   BUN 10 09/12/2013   CO2 31 09/12/2013   TSH 2.39 09/12/2013   HGBA1C 6.3 09/12/2013   MICROALBUR 0.9 09/12/2013    No results found.  Assessment & Plan:    I am having Ms. Jelley maintain  her ketoconazole, metoprolol succinate, venlafaxine XR, and traMADol.  Meds ordered this encounter  Medications  . traMADol (ULTRAM) 50 MG tablet    Sig: Take 1 tablet (50 mg total) by mouth every 8 (eight) hours as needed.    Dispense:  30 tablet    Refill:  0     Follow-up: Return in about 2 days (around 10/30/2014).  Sonda Primes, MD

## 2014-10-28 NOTE — Telephone Encounter (Signed)
Suzanne [name] from CT called and advised that stat CT was ready for dr plotnikov to look at, per dr Posey Reaplotnikov, patient was advised that ct is showing diverticulitis on LLQ, but RLQ is clear--patient needs to take tramadol until Friday, if not better, call back to office and schedule visit to see dr plotnikov---lisa in CT to advise patient

## 2014-10-28 NOTE — Assessment & Plan Note (Signed)
7/16 new: r/o appendicitis vs other CT abd today Labs To ER if worse Tramadol prn w/caution

## 2014-11-02 ENCOUNTER — Encounter: Payer: Self-pay | Admitting: Internal Medicine

## 2014-11-02 ENCOUNTER — Ambulatory Visit (INDEPENDENT_AMBULATORY_CARE_PROVIDER_SITE_OTHER): Payer: BC Managed Care – PPO | Admitting: Internal Medicine

## 2014-11-02 VITALS — BP 150/88 | HR 65 | Temp 98.1°F | Wt 226.0 lb

## 2014-11-02 DIAGNOSIS — R202 Paresthesia of skin: Secondary | ICD-10-CM

## 2014-11-02 DIAGNOSIS — R1031 Right lower quadrant pain: Secondary | ICD-10-CM | POA: Diagnosis not present

## 2014-11-02 MED ORDER — ACYCLOVIR 800 MG PO TABS: 800.0000 mg | ORAL_TABLET | Freq: Every day | ORAL | Status: DC

## 2014-11-02 MED ORDER — TRIAMCINOLONE ACETONIDE 0.1 % EX CREA: 1.0000 "application " | TOPICAL_CREAM | Freq: Three times a day (TID) | CUTANEOUS | Status: DC

## 2014-11-02 MED ORDER — HYDROCODONE-ACETAMINOPHEN 7.5-325 MG PO TABS: 1.0000 | ORAL_TABLET | Freq: Four times a day (QID) | ORAL | Status: DC | PRN

## 2014-11-02 NOTE — Progress Notes (Signed)
Subjective:  Patient ID: Suzanne [name], female    DOB: [birth year]  Age: 59 y.o. MRN: [id number]  CC: Abdominal Pain   HPI Ripley O Lawrie presents for RUQ/RLQ abd pain x x3-4 d -- 8/10 worse at night - not better. She is constipated. C/o burning and tingling in the RUQ  Outpatient Prescriptions Prior to Visit  Medication Sig Dispense Refill  . ketoconazole (NIZORAL) 2 % cream Apply 1 application topically daily. 15 g 0  . metoprolol succinate (TOPROL-XL) 100 MG 24 hr tablet TAKE ONE TABLET BY MOUTH ONCE DAILY WITH OR  IMMEDIATELY  FOLLOWING  A  MEAL 90 tablet 3  . venlafaxine XR (EFFEXOR-XR) 150 MG 24 hr capsule Take 1 capsule (150 mg total) by mouth daily with breakfast. 90 capsule 3  . traMADol (ULTRAM) 50 MG tablet Take 1 tablet (50 mg total) by mouth every 8 (eight) hours as needed. 30 tablet 0   No facility-administered medications prior to visit.    ROS Review of Systems  Constitutional: Positive for diaphoresis. Negative for fever, chills, activity change, appetite change, fatigue and unexpected weight change.  HENT: Negative for congestion, mouth sores and sinus pressure.   Eyes: Negative for visual disturbance.  Respiratory: Negative for cough and chest tightness.   Gastrointestinal: Positive for abdominal pain. Negative for nausea, vomiting, diarrhea, blood in stool, abdominal distention and anal bleeding.  Genitourinary: Negative for frequency, difficulty urinating and vaginal pain.  Musculoskeletal: Negative for back pain and gait problem.  Skin: Negative for pallor and rash.  Neurological: Negative for dizziness, tremors, weakness, numbness and headaches.  Psychiatric/Behavioral: Negative for confusion and sleep disturbance.    Objective:  BP 150/88 mmHg  Pulse 65  Temp(Src) 98.1 F (36.7 C) (Oral)  Wt 226 lb (102.513 kg)  SpO2 99%  BP Readings from Last 3 Encounters:  11/02/14 150/88  10/28/14 122/86  09/15/14 110/70    Wt Readings  from Last 3 Encounters:  11/02/14 226 lb (102.513 kg)  10/28/14 228 lb (103.42 kg)  09/15/14 230 lb (104.327 kg)    Physical Exam  Constitutional: She appears well-developed. No distress.  Obese  HENT:  Head: Normocephalic.  Right Ear: External ear normal.  Left Ear: External ear normal.  Nose: Nose normal.  Mouth/Throat: Oropharynx is clear and moist.  Eyes: Conjunctivae are normal. Pupils are equal, round, and reactive to light. Right eye exhibits no discharge. Left eye exhibits no discharge.  Neck: Normal range of motion. Neck supple. No JVD present. No tracheal deviation present. No thyromegaly present.  Cardiovascular: Normal rate, regular rhythm and normal heart sounds.   Pulmonary/Chest: No stridor. No respiratory distress. She has no wheezes.  Abdominal: Soft. Bowel sounds are normal. She exhibits no distension and no mass. There is tenderness. There is no rebound and no guarding.  Musculoskeletal: She exhibits no edema or tenderness.  Lymphadenopathy:    She has no cervical adenopathy.  Neurological: She displays normal reflexes. No cranial nerve deficit. She exhibits normal muscle tone. Coordination normal.  Skin: No rash noted. No erythema.  Psychiatric: She has a normal mood and affect. Her behavior is normal. Judgment and thought content normal.  RUQ/RLQ is sensitive No rash  Lab Results  Component Value Date   WBC 3.9* 10/28/2014   HGB 12.6 10/28/2014   HCT 38.1 10/28/2014   PLT 285.0 10/28/2014   GLUCOSE 92 10/28/2014   CHOL 149 09/12/2013   TRIG 46.0 09/12/2013   HDL 48.10 09/12/2013   LDLCALC 92 09/12/2013  ALT 11 10/28/2014   AST 17 10/28/2014   NA 140 10/28/2014   K 3.9 10/28/2014   CL 105 10/28/2014   CREATININE 0.69 10/28/2014   BUN 10 10/28/2014   CO2 29 10/28/2014   TSH 2.39 09/12/2013   HGBA1C 6.3 09/12/2013   MICROALBUR 0.9 09/12/2013   Abd CT IMPRESSION: 1. No acute findings in the abdomen or pelvis to account for the patient's  symptoms. 2. Specifically, the appendix is normal. 3. There is extensive colonic diverticulosis, particularly in the region of the sigmoid colon. At this time, there are no overt inflammatory changes to suggest an acute diverticulitis. 4. Status post cholecystectomy. Minimal intra and extrahepatic biliary ductal dilatation is favored to reflect typical post cholecystectomy physiology, and is not favored to indicate biliary tract obstruction.   Electronically Signed  By: Trudie Reed M.D.  On: 10/28/2014 13:50  No results found.  Assessment & Plan:    I have discontinued Ms. Tomson's traMADol. I am also having her start on HYDROcodone-acetaminophen, acyclovir, and triamcinolone cream. Additionally, I am having her maintain her ketoconazole, metoprolol succinate, and venlafaxine XR.  Meds ordered this encounter  Medications  . HYDROcodone-acetaminophen (NORCO) 7.5-325 MG per tablet    Sig: Take 1 tablet by mouth 4 (four) times daily as needed for moderate pain or severe pain.    Dispense:  60 tablet    Refill:  0  . acyclovir (ZOVIRAX) 800 MG tablet    Sig: Take 1 tablet (800 mg total) by mouth 5 (five) times daily.    Dispense:  35 tablet    Refill:  0  . triamcinolone cream (KENALOG) 0.1 %    Sig: Apply 1 application topically 3 (three) times daily.    Dispense:  45 g    Refill:  1     Follow-up: No Follow-up on file.  Sonda Primes, MD

## 2014-11-02 NOTE — Assessment & Plan Note (Signed)
?   Zoster Treat w/Acyclovir empirically Norco prn  Potential benefits of a short term opioids use as well as potential risks (i.e. addiction risk, apnea etc) and complications (i.e. Somnolence, constipation and others) were explained to the patient and were aknowledged. Kenalog cream

## 2014-11-02 NOTE — Assessment & Plan Note (Addendum)
7/16 new: possible shingles w/o a rash yet... CT is ok  Abd CT IMPRESSION: 1. No acute findings in the abdomen or pelvis to account for the patient's symptoms. 2. Specifically, the appendix is normal. 3. There is extensive colonic diverticulosis, particularly in the region of the sigmoid colon. At this time, there are no overt inflammatory changes to suggest an acute diverticulitis. 4. Status post cholecystectomy. Minimal intra and extrahepatic biliary ductal dilatation is favored to reflect typical post cholecystectomy physiology, and is not favored to indicate biliary tract obstruction.   Electronically Signed  By: Trudie Reedaniel Entrikin M.D.  On: 10/28/2014 13:50  Treat w/Acyclovir empirically Norco prn  Potential benefits of a short term opioids use as well as potential risks (i.e. addiction risk, apnea etc) and complications (i.e. Somnolence, constipation and others) were explained to the patient and were aknowledged.

## 2014-11-02 NOTE — Progress Notes (Signed)
Pre visit review using our clinic review tool, if applicable. No additional management support is needed unless otherwise documented below in the visit note. 

## 2014-11-19 ENCOUNTER — Other Ambulatory Visit: Payer: Self-pay | Admitting: Internal Medicine

## 2014-12-10 ENCOUNTER — Encounter: Payer: Self-pay | Admitting: Gastroenterology

## 2015-02-18 ENCOUNTER — Other Ambulatory Visit: Payer: Self-pay | Admitting: Internal Medicine

## 2015-08-05 IMAGING — CT CT ABD-PELV W/ CM
2 of 5 series · 16 of 46 positions shown, 18 images · IV contrast (Omnipaque 300)
Comparison: No priors.

CLINICAL DATA: 58-year-old female with history of right lower
quadrant abdominal pain for the past 3 days.

EXAM:
CT ABDOMEN AND PELVIS WITH CONTRAST
TECHNIQUE: Multidetector CT imaging of the abdomen and pelvis was performed
using the standard protocol following bolus administration of
intravenous contrast.
CONTRAST:  100mL OMNIPAQUE IOHEXOL 300 MG/ML  SOLN

[Series 2: abd/ pel 5mm · axial · 0.72mm/px · z∈[-420,-26]mm · 13 of 89 slices shown, 15 images]
[im 5/89  soft-tissue]
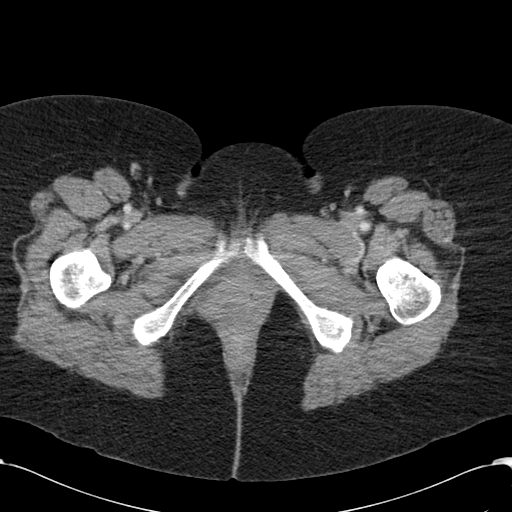
[im 5/89  bone]
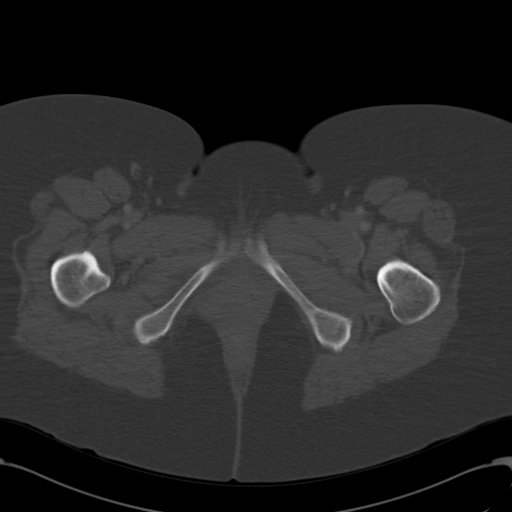
[im 14/89  soft-tissue]
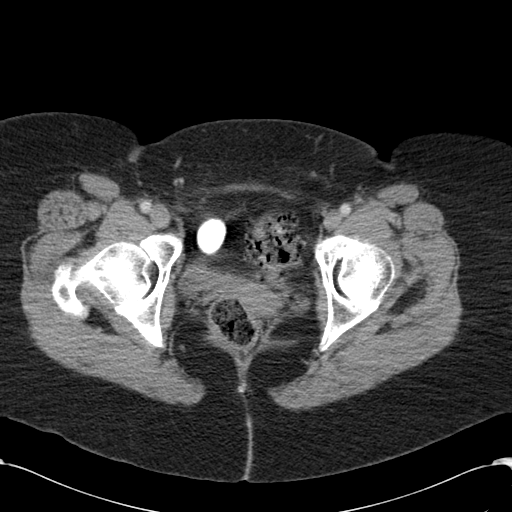
[im 18/89  soft-tissue]
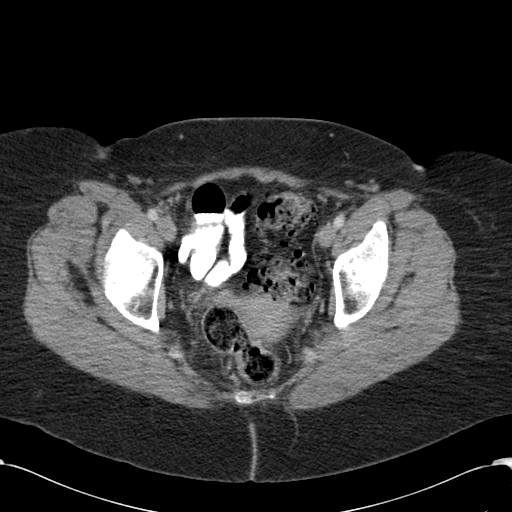
[im 27/89  soft-tissue]
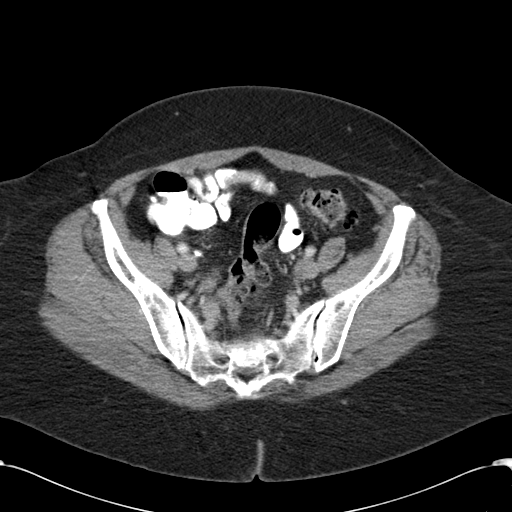
[im 31/89  soft-tissue]
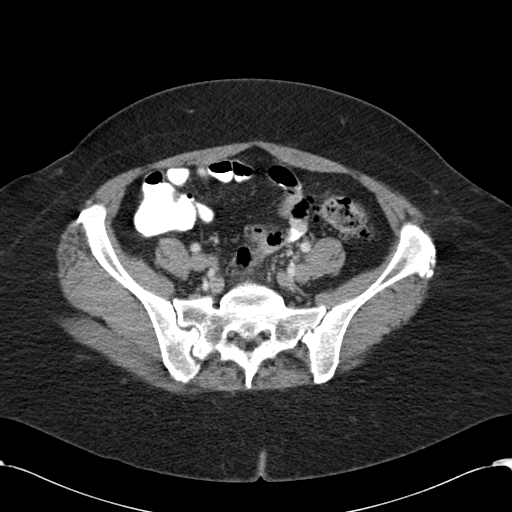
[im 40/89  soft-tissue]
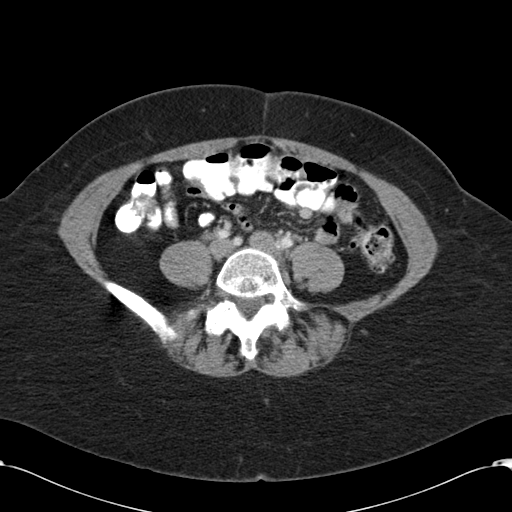
[im 45/89  soft-tissue]
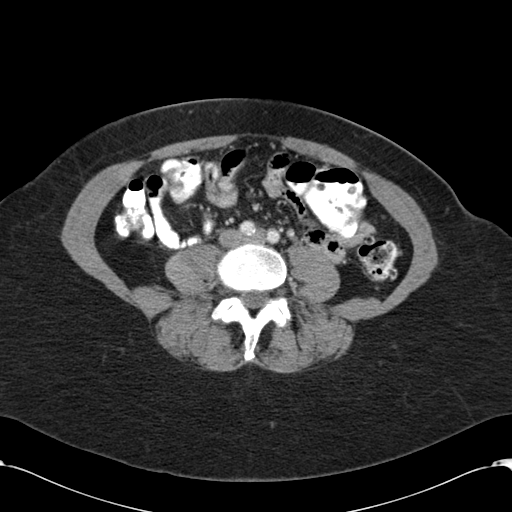
[im 49/89  soft-tissue]
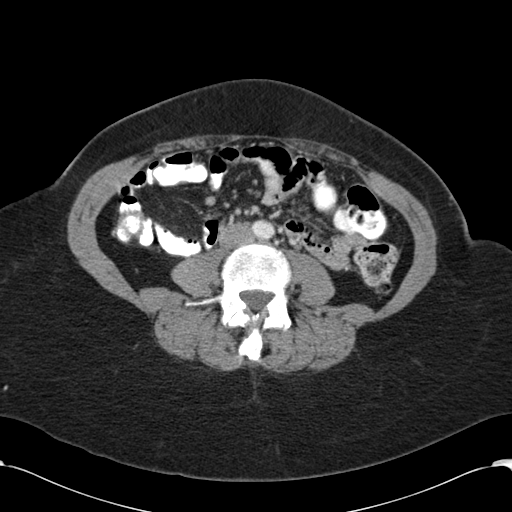
[im 58/89  soft-tissue]
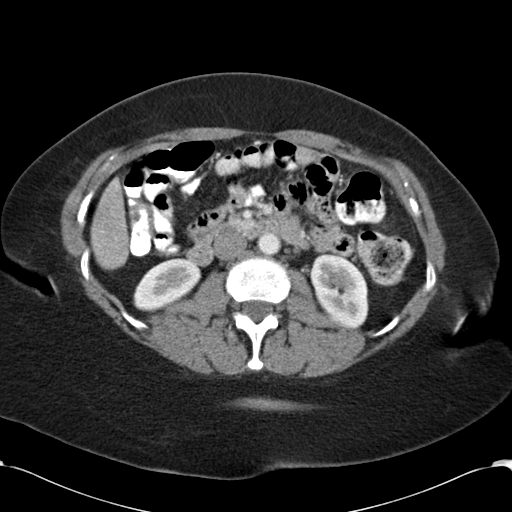
[im 58/89  bone]
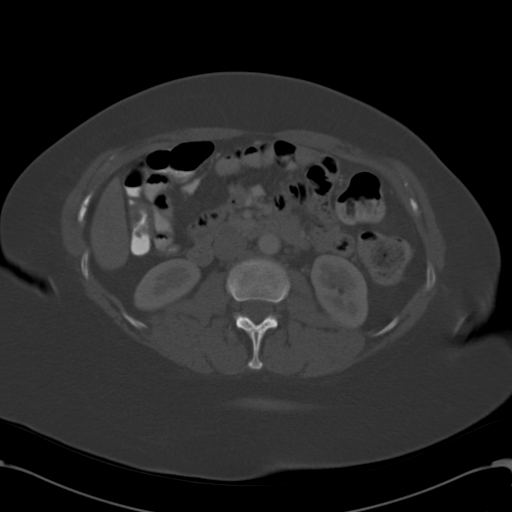
[im 62/89  soft-tissue]
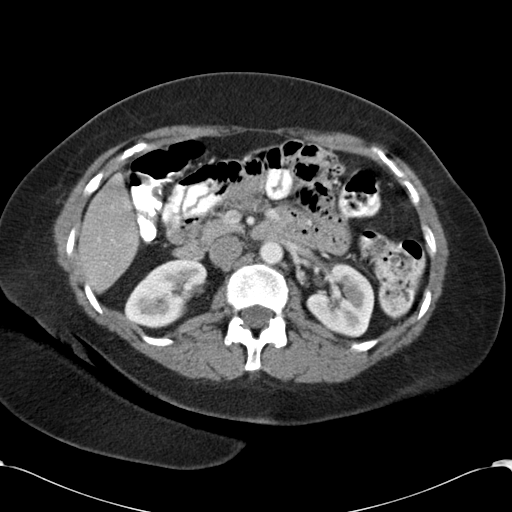
[im 71/89  soft-tissue]
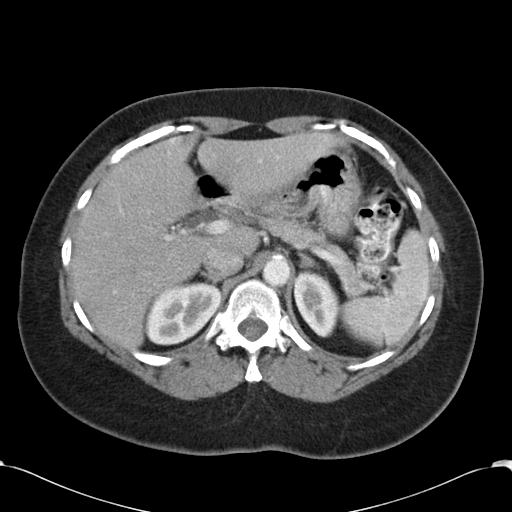
[im 75/89  soft-tissue]
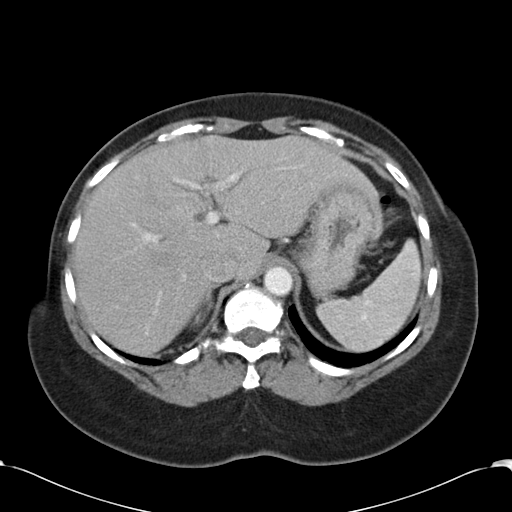
[im 84/89  soft-tissue]
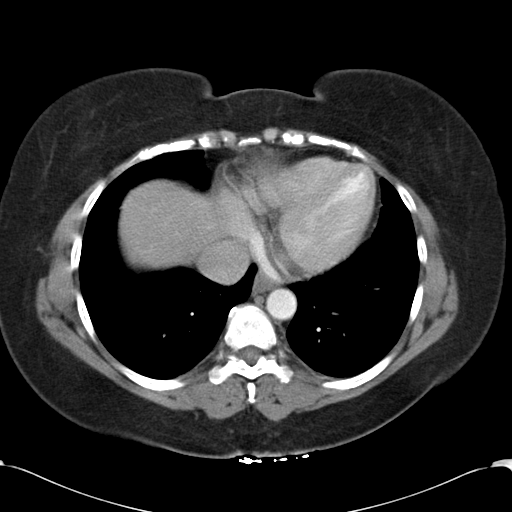

[Series 602: cor · coronal · 0.89mm/px · 3 of 96 slices shown]
[im 32/96  soft-tissue]
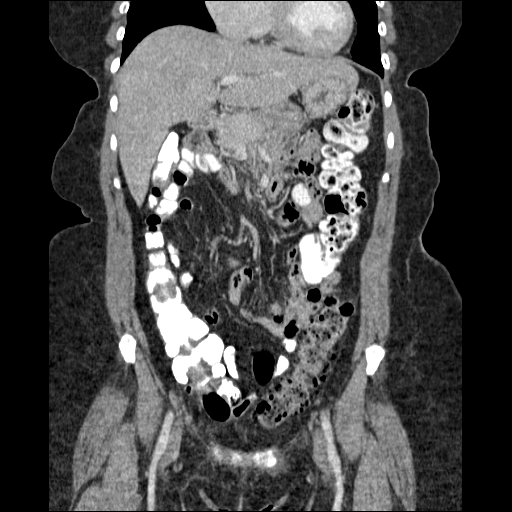
[im 43/96  soft-tissue]
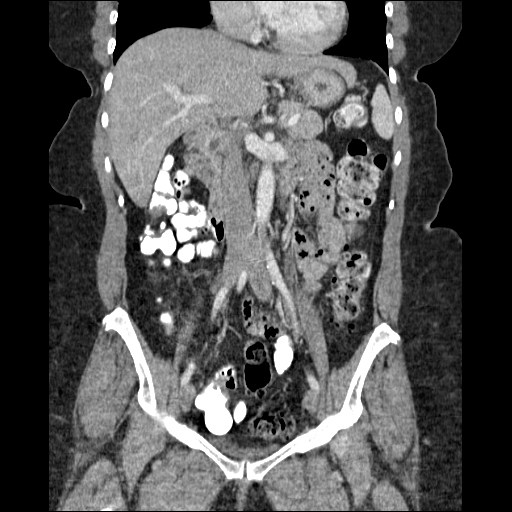
[im 53/96  soft-tissue]
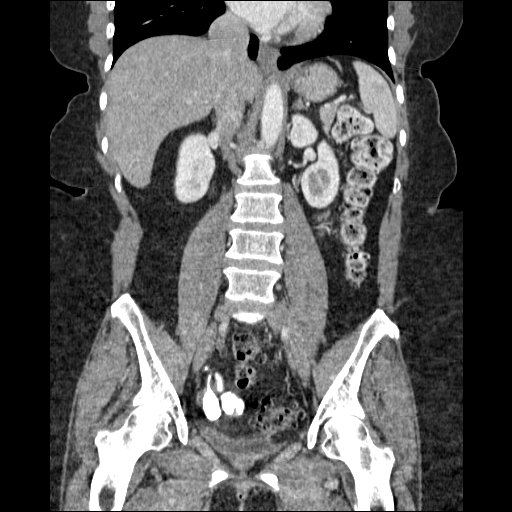

[16 of 46 positions shown; findings below may reference images not displayed]

FINDINGS: Lower chest:  Unremarkable.

Hepatobiliary: No cystic or solid hepatic lesions. Status post
cholecystectomy. Minimal intrahepatic biliary ductal dilatation.
Common bile duct mildly dilated 8 mm in the porta hepatis (within
normal limits for post cholecystectomy patient).

Pancreas: No pancreatic mass. No pancreatic ductal dilatation. No
pancreatic or peripancreatic fluid or inflammatory changes.

Spleen: Unremarkable.

Adrenals/Urinary Tract: Bilateral adrenal glands and bilateral
kidneys are normal in appearance. No hydroureteronephrosis. Urinary
bladder is nearly completely decompressed, but unremarkable in
appearance.

Stomach/Bowel: Normal appearance of the stomach. No pathologic
dilatation of small bowel or colon. Numerous colonic diverticulae
are noted, particularly in the region of the sigmoid colon, without
surrounding inflammatory changes to suggest an acute diverticulitis
at this time. Normal appendix.

Vascular/Lymphatic: Atherosclerosis throughout the abdominal and
pelvic vasculature, without evidence of aneurysm or dissection. No
lymphadenopathy noted in the abdomen or pelvis.

Reproductive: Uterus is retroverted.  Ovaries are atrophic.

Other: No significant volume of ascites.  No pneumoperitoneum.

Musculoskeletal: There are no aggressive appearing lytic or blastic
lesions noted in the visualized portions of the skeleton.
IMPRESSION: 1. No acute findings in the abdomen or pelvis to account for the
patient's symptoms.
2. Specifically, the appendix is normal.
3. There is extensive colonic diverticulosis, particularly in the
region of the sigmoid colon. At this time, there are no overt
inflammatory changes to suggest an acute diverticulitis.
4. Status post cholecystectomy. Minimal intra and extrahepatic
biliary ductal dilatation is favored to reflect typical post
cholecystectomy physiology, and is not favored to indicate biliary
tract obstruction.

## 2015-08-23 ENCOUNTER — Telehealth: Payer: Self-pay | Admitting: Family

## 2015-08-23 ENCOUNTER — Encounter: Payer: Self-pay | Admitting: Family

## 2015-08-23 ENCOUNTER — Ambulatory Visit (INDEPENDENT_AMBULATORY_CARE_PROVIDER_SITE_OTHER): Payer: BC Managed Care – PPO | Admitting: Family

## 2015-08-23 ENCOUNTER — Other Ambulatory Visit (INDEPENDENT_AMBULATORY_CARE_PROVIDER_SITE_OTHER): Payer: BC Managed Care – PPO

## 2015-08-23 VITALS — BP 124/82 | HR 66 | Temp 98.1°F | Resp 16 | Ht 65.0 in | Wt 229.0 lb

## 2015-08-23 DIAGNOSIS — Z23 Encounter for immunization: Secondary | ICD-10-CM | POA: Diagnosis not present

## 2015-08-23 DIAGNOSIS — Z0000 Encounter for general adult medical examination without abnormal findings: Secondary | ICD-10-CM | POA: Diagnosis not present

## 2015-08-23 DIAGNOSIS — Z1231 Encounter for screening mammogram for malignant neoplasm of breast: Secondary | ICD-10-CM

## 2015-08-23 DIAGNOSIS — F419 Anxiety disorder, unspecified: Secondary | ICD-10-CM

## 2015-08-23 DIAGNOSIS — I10 Essential (primary) hypertension: Secondary | ICD-10-CM

## 2015-08-23 DIAGNOSIS — E119 Type 2 diabetes mellitus without complications: Secondary | ICD-10-CM | POA: Diagnosis not present

## 2015-08-23 DIAGNOSIS — Z124 Encounter for screening for malignant neoplasm of cervix: Secondary | ICD-10-CM

## 2015-08-23 DIAGNOSIS — E669 Obesity, unspecified: Secondary | ICD-10-CM

## 2015-08-23 DIAGNOSIS — Z0001 Encounter for general adult medical examination with abnormal findings: Secondary | ICD-10-CM | POA: Insufficient documentation

## 2015-08-23 LAB — CBC
HCT: 36.8 % (ref 36.0–46.0)
Hemoglobin: 12.2 g/dL (ref 12.0–15.0)
MCHC: 33.1 g/dL (ref 30.0–36.0)
MCV: 84.8 fl (ref 78.0–100.0)
Platelets: 293 10*3/uL (ref 150.0–400.0)
RBC: 4.34 Mil/uL (ref 3.87–5.11)
RDW: 15.4 % (ref 11.5–15.5)
WBC: 4.1 10*3/uL (ref 4.0–10.5)

## 2015-08-23 LAB — LIPID PANEL
Cholesterol: 149 mg/dL (ref 0–200)
HDL: 46.3 mg/dL (ref >39.00)
LDL Cholesterol: 94 mg/dL (ref 0–99)
NonHDL: 102.82
Total CHOL/HDL Ratio: 3
Triglycerides: 45 mg/dL (ref 0.0–149.0)
VLDL: 9 mg/dL (ref 0.0–40.0)

## 2015-08-23 LAB — MICROALBUMIN / CREATININE URINE RATIO
Creatinine,U: 143.2 mg/dL
Microalb Creat Ratio: 0.5 mg/g (ref 0.0–30.0)
Microalb, Ur: 0.7 mg/dL (ref 0.0–1.9)

## 2015-08-23 LAB — COMPREHENSIVE METABOLIC PANEL
ALT: 10 U/L (ref 0–35)
AST: 16 U/L (ref 0–37)
Albumin: 3.9 g/dL (ref 3.5–5.2)
Alkaline Phosphatase: 84 U/L (ref 39–117)
BUN: 14 mg/dL (ref 6–23)
CO2: 28 mEq/L (ref 19–32)
Calcium: 9.1 mg/dL (ref 8.4–10.5)
Chloride: 104 mEq/L (ref 96–112)
Creatinine, Ser: 0.68 mg/dL (ref 0.40–1.20)
GFR: 112.82 mL/min (ref >60.00)
Glucose, Bld: 103 mg/dL — ABNORMAL HIGH (ref 70–99)
Potassium: 4.4 mEq/L (ref 3.5–5.1)
Sodium: 139 mEq/L (ref 135–145)
Total Bilirubin: 0.3 mg/dL (ref 0.2–1.2)
Total Protein: 7.4 g/dL (ref 6.0–8.3)

## 2015-08-23 LAB — VITAMIN D 25 HYDROXY (VIT D DEFICIENCY, FRACTURES): VITD: 37.37 ng/mL (ref 30.00–100.00)

## 2015-08-23 LAB — TSH: TSH: 1.07 u[IU]/mL (ref 0.35–4.50)

## 2015-08-23 LAB — HEPATITIS C ANTIBODY: HCV Ab: NEGATIVE

## 2015-08-23 LAB — HEMOGLOBIN A1C: Hgb A1c MFr Bld: 6.4 % (ref 4.6–6.5)

## 2015-08-23 MED ORDER — VENLAFAXINE HCL ER 150 MG PO CP24: 150.0000 mg | ORAL_CAPSULE | Freq: Every day | ORAL | Status: DC

## 2015-08-23 MED ORDER — CLOTRIMAZOLE-BETAMETHASONE 1-0.05 % EX CREA: 1.0000 "application " | TOPICAL_CREAM | Freq: Two times a day (BID) | CUTANEOUS | Status: DC

## 2015-08-23 MED ORDER — METOPROLOL SUCCINATE ER 100 MG PO TB24: 100.0000 mg | ORAL_TABLET | Freq: Every day | ORAL | Status: DC

## 2015-08-23 NOTE — Assessment & Plan Note (Addendum)
1) Anticipatory Guidance: Discussed importance of wearing a seatbelt while driving and not texting while driving; changing batteries in smoke detector at least once annually; wearing suntan lotion when outside; eating a balanced and moderate diet; getting physical activity at least 30 minutes per day.  2) Immunizations / Screenings / Labs:  Declines Zostavax. Pneumovax updated today for diabetes. All other immunizations are up-to-date per recommendations. Due for a diabetic eye exam encouraged to be completed independently. Due for a dental exam encouraged to be completed independently. Referral to gynecology placed for Pap smear. Due for breast cancer screening with order for mammogram placed. Diabetic foot exam completed today. Obtain hepatitis C antibody for hepatitis C screening. Obtain vitamin D for vitamin D deficiency. All other screenings are up-to-date per recommendations. Obtain CBC, CMET, Lipid profile and TSH.   Overall well exam with risk factors for cardiovascular disease including type 2 diabetes, obesity, and hypertension. Hypertension and type 2 diabetes are adequately controlled through lifestyle management and location. BMI of 38. Recommend weight loss approximate 5-10% of current body weight through lifestyle changes.Recommend increasing physical activity to 30 minutes of moderate level activity daily. Encourage nutritional intake that focuses on nutrient dense foods and is moderate, varied, and balanced and is low in saturated fats and processed/sugary foods. Continue other healthy lifestyle choices and behaviors. Follow-up prevention exam in 1 year. Follow-up office visit for chronic conditions pending blood work.

## 2015-08-23 NOTE — Progress Notes (Signed)
Pre visit review using our clinic review tool, if applicable. No additional management support is needed unless otherwise documented below in the visit note. 

## 2015-08-23 NOTE — Assessment & Plan Note (Signed)
Hypertension is adequate control with current regimen metoprolol. Denies adverse side effects. Continue current dosage of metoprolol.

## 2015-08-23 NOTE — Progress Notes (Signed)
Subjective:    Patient ID: Suzanne [name], female    DOB: 02/15/1956, 60 y.o.   MRN: [id number]  Chief Complaint  Patient presents with  . Establish Care    CPE, Fasting, does want to get set up with mammogram    HPI:  Suzanne [name] is a 60 y.o. female who presents today for an annual wellness visit.   1) Health Maintenance -   Diet - Averages about 2-3 meals per day which consist of chicken, some fruits and vegetables, starches; Caffeine intake 3-4 cups per day.   Exercise - No structured exercise.    2) Preventative Exams / Immunizations:  Dental -- Due for exam   Vision -- Due for exam    Health Maintenance  Topic Date Due  . Hepatitis C Screening  [birth year]  . PNEUMOCOCCAL POLYSACCHARIDE VACCINE (1) 11/01/1955  . OPHTHALMOLOGY EXAM  11/01/1963  . HIV Screening  10/31/1968  . FOOT EXAM  03/26/2012  . ZOSTAVAX  10/31/2013  . HEMOGLOBIN A1C  03/15/2014  . MAMMOGRAM  05/15/2014  . URINE MICROALBUMIN  09/13/2014  . PAP SMEAR  08/27/2015  . INFLUENZA VACCINE  11/23/2015  . COLONOSCOPY  07/24/2019  . TETANUS/TDAP  06/25/2021    Immunization History  Administered Date(s) Administered  . Influenza Whole 01/19/2010  . Tdap 06/26/2011    Allergies  Allergen Reactions  . Other Swelling    ALL SEAFOOD  . Shrimp [Shellfish Allergy] Swelling    Pt states she allergic to seafood     Outpatient Prescriptions Prior to Visit  Medication Sig Dispense Refill  . acyclovir (ZOVIRAX) 800 MG tablet Take 1 tablet (800 mg total) by mouth 5 (five) times daily. 35 tablet 0  . HYDROcodone-acetaminophen (NORCO) 7.5-325 MG per tablet Take 1 tablet by mouth 4 (four) times daily as needed for moderate pain or severe pain. 60 tablet 0  . ketoconazole (NIZORAL) 2 % cream Apply 1 application topically daily. 15 g 0  . triamcinolone cream (KENALOG) 0.1 % Apply 1 application topically 3 (three) times daily. 45 g 1  . metoprolol succinate (TOPROL-XL) 100 MG 24  hr tablet TAKE ONE TABLET BY MOUTH ONCE DAILY 90 tablet 1  . venlafaxine XR (EFFEXOR-XR) 150 MG 24 hr capsule Take 1 capsule (150 mg total) by mouth daily with breakfast. 90 capsule 3   No facility-administered medications prior to visit.     Past Medical History  Diagnosis Date  . Anxiety   . OBESITY   . RAYNAUDS SYNDROME   . Diabetes mellitus, type 2 (HCC)   . HYPERTENSION   . GERD      Past Surgical History  Procedure Laterality Date  . Cholecystectomy  2000     Family History  Problem Relation Age of Onset  . Lung cancer Father   . Allergies Brother   . Allergies Son   . Lung cancer Maternal Aunt   . Colon cancer Maternal Aunt   . Clotting disorder Other      Social History   Social History  . Marital Status: Married    Spouse Name: N/A  . Number of Children: 1  . Years of Education: N/A   Occupational History  . Not on file.   Social History Main Topics  . Smoking status: Never Smoker   . Smokeless tobacco: Never Used  . Alcohol Use: No  . Drug Use: No  . Sexual Activity:    Partners: Male    Birth Control/ Protection: Post-menopausal  Comment: [place] Has worked before in [employer]. Data processing manager for [employer]. Lives with her husband and 16y son,and dog   Other Topics Concern  . Not on file   Social History Narrative   Out of work 06/2011       Review of Systems  Constitutional: Denies fever, chills, fatigue, or significant weight gain/loss. HENT: Head: Denies headache or neck pain Ears: Denies changes in hearing, ringing in ears, earache, drainage Nose: Denies discharge, stuffiness, itching, nosebleed, sinus pain Throat: Denies sore throat, hoarseness, dry mouth, sores, thrush Eyes: Denies loss/changes in vision, pain, redness, blurry/double vision, flashing lights Cardiovascular: Denies chest pain/discomfort, tightness, palpitations, shortness of breath with activity, difficulty lying down, swelling, sudden  awakening with shortness of breath Respiratory: Denies shortness of breath, cough, sputum production, wheezing Gastrointestinal: Denies dysphasia, heartburn, change in appetite, nausea, change in bowel habits, rectal bleeding, constipation, diarrhea, yellow skin or eyes Genitourinary: Denies frequency, urgency, burning/pain, blood in urine, incontinence, change in urinary strength. Musculoskeletal: Denies muscle/joint pain, stiffness, back pain, redness or swelling of joints, trauma Skin: Denies rashes, lumps, itching, dryness, color changes, or hair/nail changes Neurological: Denies dizziness, fainting, seizures, weakness, numbness, tingling, tremor Psychiatric - Denies nervousness, stress, depression or memory loss Endocrine: Denies heat or cold intolerance, sweating, frequent urination, excessive thirst, changes in appetite Hematologic: Denies ease of bruising or bleeding     Objective:     BP 124/82 mmHg  Pulse 66  Temp(Src) 98.1 F (36.7 C) (Oral)  Resp 16  Ht 5\' 5"  (1.651 m)  Wt 229 lb (103.874 kg)  BMI 38.11 kg/m2  SpO2 97% Nursing note and vital signs reviewed.  Physical Exam  Constitutional: She is oriented to person, place, and time. She appears well-developed and well-nourished.  HENT:  Head: Normocephalic.  Right Ear: Hearing, tympanic membrane, external ear and ear canal normal.  Left Ear: Hearing, tympanic membrane, external ear and ear canal normal.  Nose: Nose normal.  Mouth/Throat: Uvula is midline, oropharynx is clear and moist and mucous membranes are normal.  Eyes: Conjunctivae and EOM are normal. Pupils are equal, round, and reactive to light.  Neck: Neck supple. No JVD present. No tracheal deviation present. No thyromegaly present.  Cardiovascular: Normal rate, regular rhythm, normal heart sounds and intact distal pulses.   Pulmonary/Chest: Effort normal and breath sounds normal.  Abdominal: Soft. Bowel sounds are normal. She exhibits no distension and no  mass. There is no tenderness. There is no rebound and no guarding.  Musculoskeletal: Normal range of motion. She exhibits no edema or tenderness.  Lymphadenopathy:    She has no cervical adenopathy.  Neurological: She is alert and oriented to person, place, and time. She has normal reflexes. No cranial nerve deficit. She exhibits normal muscle tone. Coordination normal.  Skin: Skin is warm and dry.  Psychiatric: She has a normal mood and affect. Her behavior is normal. Judgment and thought content normal.       Assessment & Plan:   Problem List Items Addressed This Visit      Cardiovascular and Mediastinum   Essential hypertension    Hypertension is adequate control with current regimen metoprolol. Denies adverse side effects. Continue current dosage of metoprolol.      Relevant Medications   metoprolol succinate (TOPROL-XL) 100 MG 24 hr tablet     Endocrine   Diabetes mellitus type 2, diet-controlled (HCC)    Previous A1c of 6.3. Obtain A1c. Currently managed through lifestyle management. Obtain urine microalbumin  for kidney screening related to diabetes. Diabetic foot exam completed today. Not maintained on medications for CAD risk reduction. Encouraged to complete diabetic eye exam independently.      Relevant Orders   Urine Microalbumin w/creat. ratio (Completed)     Other   OBESITY    BMI of 38. Recommend weight loss goal approximate 5-10% of current body weight through nutrition and physical activity.Recommend increasing physical activity to 30 minutes of moderate level activity daily. Encourage nutritional intake that focuses on nutrient dense foods and is moderate, varied, and balanced and is low in saturated fats and processed/sugary foods. Follow-up after initiation of lifestyle changes.      Anxiety   Relevant Medications   venlafaxine XR (EFFEXOR-XR) 150 MG 24 hr capsule   Routine general medical examination at a health care facility - Primary    1) Anticipatory  Guidance: Discussed importance of wearing a seatbelt while driving and not texting while driving; changing batteries in smoke detector at least once annually; wearing suntan lotion when outside; eating a balanced and moderate diet; getting physical activity at least 30 minutes per day.  2) Immunizations / Screenings / Labs:  Declines Zostavax. Will schedule a time for pneumococcal vaccination for diabetes. All other immunizations are up-to-date per recommendations. Due for a diabetic eye exam encouraged to be completed independently. Due for a dental exam encouraged to be completed independently. Referral to gynecology placed for Pap smear. Due for breast cancer screening with order for mammogram placed. Diabetic foot exam completed today. Obtain hepatitis C antibody for hepatitis C screening. Obtain vitamin D for vitamin D deficiency. All other screenings are up-to-date per recommendations. Obtain CBC, CMET, Lipid profile and TSH.   Overall well exam with risk factors for cardiovascular disease including type 2 diabetes, obesity, and hypertension. Hypertension and type 2 diabetes are adequately controlled through lifestyle management and location. BMI of 38. Recommend weight loss approximate 5-10% of current body weight through lifestyle changes.Recommend increasing physical activity to 30 minutes of moderate level activity daily. Encourage nutritional intake that focuses on nutrient dense foods and is moderate, varied, and balanced and is low in saturated fats and processed/sugary foods. Continue other healthy lifestyle choices and behaviors. Follow-up prevention exam in 1 year. Follow-up office visit for chronic conditions pending blood work.      Relevant Orders   CBC (Completed)   Comprehensive metabolic panel (Completed)   Lipid panel (Completed)   TSH (Completed)   Hemoglobin A1c (Completed)   Hepatitis C antibody   Urine Microalbumin w/creat. ratio (Completed)   Vitamin D (25 hydroxy)  (Completed)    Other Visit Diagnoses    Screening for cervical cancer        Relevant Orders    Ambulatory referral to Gynecology    Encounter for screening mammogram for breast cancer        Relevant Orders    MM DIGITAL SCREENING BILATERAL

## 2015-08-23 NOTE — Addendum Note (Signed)
Addended by: Mercer PodWRENN, Eyva Califano E on: 08/23/2015 01:02 PM   Modules accepted: Orders

## 2015-08-23 NOTE — Telephone Encounter (Signed)
Please inform patient that her blood work shows that her hepatitis C is negative. Her kidney function, liver function, electrolytes, white/red blood cells, thyroid function and cholesterol are all within the normal limits. A1c has slightly elevated to 6.4 which is just still good control for her diabetes. Therefore please continue with lifestyle management at this time. We will follow up in 6 months. Also please remind her to schedule a diabetic eye exam with an eye doctor.

## 2015-08-23 NOTE — Assessment & Plan Note (Signed)
BMI of 38. Recommend weight loss goal approximate 5-10% of current body weight through nutrition and physical activity.Recommend increasing physical activity to 30 minutes of moderate level activity daily. Encourage nutritional intake that focuses on nutrient dense foods and is moderate, varied, and balanced and is low in saturated fats and processed/sugary foods. Follow-up after initiation of lifestyle changes.

## 2015-08-23 NOTE — Assessment & Plan Note (Addendum)
Previous A1c of 6.3. Obtain A1c. Currently managed through lifestyle management. Obtain urine microalbumin for kidney screening related to diabetes. Diabetic foot exam completed today. Not maintained on medications for CAD risk reduction. Encouraged to complete diabetic eye exam independently. Pneumovax updated today.

## 2015-08-23 NOTE — Patient Instructions (Signed)
Thank you for choosing [facility].  Summary/Instructions:  Please continue to take your medications as prescribed.  Follow up pending blood work.   Complete diabetic eye exam at your convenience  Schedule a dental appointment.   You will hear back for your mammogram and gynecology appointments.   Your prescription(s) have been submitted to your pharmacy or been printed and provided for you. Please take as directed and contact our office if you believe you are having problem(s) with the medication(s) or have any questions.  Please stop by the lab on the basement level of the building for your blood work. Your results will be released to [place] (or called to you) after review, usually within 72 hours after test completion. If any changes need to be made, you will be notified at that same time.  Health Maintenance, Female Adopting a healthy lifestyle and getting preventive care can go a long way to promote health and wellness. Talk with your health care provider about what schedule of regular examinations is right for you. This is a good chance for you to check in with your provider about disease prevention and staying healthy. In between checkups, there are plenty of things you can do on your own. Experts have done a lot of research about which lifestyle changes and preventive measures are most likely to keep you healthy. Ask your health care provider for more information. WEIGHT AND DIET  Eat a healthy diet  Be sure to include plenty of vegetables, fruits, low-fat dairy products, and lean protein.  Do not eat a lot of foods high in solid fats, added sugars, or salt.  Get regular exercise. This is one of the most important things you can do for your health.  Most adults should exercise for at least 150 minutes each week. The exercise should increase your heart rate and make you sweat (moderate-intensity exercise).  Most adults should also do strengthening exercises at least  twice a week. This is in addition to the moderate-intensity exercise.  Maintain a healthy weight  Body mass index (BMI) is a measurement that can be used to identify possible weight problems. It estimates body fat based on height and weight. Your health care provider can help determine your BMI and help you achieve or maintain a healthy weight.  For females 59 years of age and older:   A BMI below 18.5 is considered underweight.  A BMI of 18.5 to 24.9 is normal.  A BMI of 25 to 29.9 is considered overweight.  A BMI of 30 and above is considered obese.  Watch levels of cholesterol and blood lipids  You should start having your blood tested for lipids and cholesterol at 62 years of age, then have this test every 5 years.  You may need to have your cholesterol levels checked more often if:  Your lipid or cholesterol levels are high.  You are older than 62 years of age.  You are at high risk for heart disease.  CANCER SCREENING   Lung Cancer  Lung cancer screening is recommended for adults 69-18 years old who are at high risk for lung cancer because of a history of smoking.  A yearly low-dose CT scan of the lungs is recommended for people who:  Currently smoke.  Have quit within the past 15 years.  Have at least a 30-pack-year history of smoking. A pack year is smoking an average of one pack of cigarettes a day for 1 year.  Yearly screening should continue until  it has been 15 years since you quit.  Yearly screening should stop if you develop a health problem that would prevent you from having lung cancer treatment.  Breast Cancer  Practice breast self-awareness. This means understanding how your breasts normally appear and feel.  It also means doing regular breast self-exams. Let your health care provider know about any changes, no matter how small.  If you are in your 20s or 30s, you should have a clinical breast exam (CBE) by a health care provider every 1-3 years  as part of a regular health exam.  If you are 19 or older, have a CBE every year. Also consider having a breast X-ray (mammogram) every year.  If you have a family history of breast cancer, talk to your health care provider about genetic screening.  If you are at high risk for breast cancer, talk to your health care provider about having an MRI and a mammogram every year.  Breast cancer gene (BRCA) assessment is recommended for women who have family members with BRCA-related cancers. BRCA-related cancers include:  Breast.  Ovarian.  Tubal.  Peritoneal cancers.  Results of the assessment will determine the need for genetic counseling and BRCA1 and BRCA2 testing. Cervical Cancer Your health care provider may recommend that you be screened regularly for cancer of the pelvic organs (ovaries, uterus, and vagina). This screening involves a pelvic examination, including checking for microscopic changes to the surface of your cervix (Pap test). You may be encouraged to have this screening done every 3 years, beginning at age 30.  For women ages 62-65, health care providers may recommend pelvic exams and Pap testing every 3 years, or they may recommend the Pap and pelvic exam, combined with testing for human papilloma virus (HPV), every 5 years. Some types of HPV increase your risk of cervical cancer. Testing for HPV may also be done on women of any age with unclear Pap test results.  Other health care providers may not recommend any screening for nonpregnant women who are considered low risk for pelvic cancer and who do not have symptoms. Ask your health care provider if a screening pelvic exam is right for you.  If you have had past treatment for cervical cancer or a condition that could lead to cancer, you need Pap tests and screening for cancer for at least 20 years after your treatment. If Pap tests have been discontinued, your risk factors (such as having a new sexual partner) need to be  reassessed to determine if screening should resume. Some women have medical problems that increase the chance of getting cervical cancer. In these cases, your health care provider may recommend more frequent screening and Pap tests. Colorectal Cancer  This type of cancer can be detected and often prevented.  Routine colorectal cancer screening usually begins at 61 years of age and continues through 62 years of age.  Your health care provider may recommend screening at an earlier age if you have risk factors for colon cancer.  Your health care provider may also recommend using home test kits to check for hidden blood in the stool.  A small camera at the end of a tube can be used to examine your colon directly (sigmoidoscopy or colonoscopy). This is done to check for the earliest forms of colorectal cancer.  Routine screening usually begins at age 67.  Direct examination of the colon should be repeated every 5-10 years through 62 years of age. However, you may need to be screened  more often if early forms of precancerous polyps or small growths are found. Skin Cancer  Check your skin from head to toe regularly.  Tell your health care provider about any new moles or changes in moles, especially if there is a change in a mole's shape or color.  Also tell your health care provider if you have a mole that is larger than the size of a pencil eraser.  Always use sunscreen. Apply sunscreen liberally and repeatedly throughout the day.  Protect yourself by wearing long sleeves, pants, a wide-brimmed hat, and sunglasses whenever you are outside. HEART DISEASE, DIABETES, AND HIGH BLOOD PRESSURE   High blood pressure causes heart disease and increases the risk of stroke. High blood pressure is more likely to develop in:  People who have blood pressure in the high end of the normal range (130-139/85-89 mm Hg).  People who are overweight or obese.  People who are African American.  If you are  47-8 years of age, have your blood pressure checked every 3-5 years. If you are 88 years of age or older, have your blood pressure checked every year. You should have your blood pressure measured twice--once when you are at a hospital or clinic, and once when you are not at a hospital or clinic. Record the average of the two measurements. To check your blood pressure when you are not at a hospital or clinic, you can use:  An automated blood pressure machine at a pharmacy.  A home blood pressure monitor.  If you are between 2 years and 58 years old, ask your health care provider if you should take aspirin to prevent strokes.  Have regular diabetes screenings. This involves taking a blood sample to check your fasting blood sugar level.  If you are at a normal weight and have a low risk for diabetes, have this test once every three years after 62 years of age.  If you are overweight and have a high risk for diabetes, consider being tested at a younger age or more often. PREVENTING INFECTION  Hepatitis B  If you have a higher risk for hepatitis B, you should be screened for this virus. You are considered at high risk for hepatitis B if:  You were born in a country where hepatitis B is common. Ask your health care provider which countries are considered high risk.  Your parents were born in a high-risk country, and you have not been immunized against hepatitis B (hepatitis B vaccine).  You have HIV or AIDS.  You use needles to inject street drugs.  You live with someone who has hepatitis B.  You have had sex with someone who has hepatitis B.  You get hemodialysis treatment.  You take certain medicines for conditions, including cancer, organ transplantation, and autoimmune conditions. Hepatitis C  Blood testing is recommended for:  Everyone born from [birth year] through 1967.  Anyone with known risk factors for hepatitis C. Sexually transmitted infections (STIs)  You should be screened  for sexually transmitted infections (STIs) including gonorrhea and chlamydia if:  You are sexually active and are younger than 62 years of age.  You are older than 62 years of age and your health care provider tells you that you are at risk for this type of infection.  Your sexual activity has changed since you were last screened and you are at an increased risk for chlamydia or gonorrhea. Ask your health care provider if you are at risk.  If you do not  have HIV, but are at risk, it may be recommended that you take a prescription medicine daily to prevent HIV infection. This is called pre-exposure prophylaxis (PrEP). You are considered at risk if:  You are sexually active and do not regularly use condoms or know the HIV status of your partner(s).  You take drugs by injection.  You are sexually active with a partner who has HIV. Talk with your health care provider about whether you are at high risk of being infected with HIV. If you choose to begin PrEP, you should first be tested for HIV. You should then be tested every 3 months for as long as you are taking PrEP.  PREGNANCY   If you are premenopausal and you may become pregnant, ask your health care provider about preconception counseling.  If you may become pregnant, take 400 to 800 micrograms (mcg) of folic acid every day.  If you want to prevent pregnancy, talk to your health care provider about birth control (contraception). OSTEOPOROSIS AND MENOPAUSE   Osteoporosis is a disease in which the bones lose minerals and strength with aging. This can result in serious bone fractures. Your risk for osteoporosis can be identified using a bone density scan.  If you are 69 years of age or older, or if you are at risk for osteoporosis and fractures, ask your health care provider if you should be screened.  Ask your health care provider whether you should take a calcium or vitamin D supplement to lower your risk for osteoporosis.  Menopause  may have certain physical symptoms and risks.  Hormone replacement therapy may reduce some of these symptoms and risks. Talk to your health care provider about whether hormone replacement therapy is right for you.  HOME CARE INSTRUCTIONS   Schedule regular health, dental, and eye exams.  Stay current with your immunizations.   Do not use any tobacco products including cigarettes, chewing tobacco, or electronic cigarettes.  If you are pregnant, do not drink alcohol.  If you are breastfeeding, limit how much and how often you drink alcohol.  Limit alcohol intake to no more than 1 drink per day for nonpregnant women. One drink equals 12 ounces of beer, 5 ounces of wine, or 1 ounces of hard liquor.  Do not use street drugs.  Do not share needles.  Ask your health care provider for help if you need support or information about quitting drugs.  Tell your health care provider if you often feel depressed.  Tell your health care provider if you have ever been abused or do not feel safe at home.   This information is not intended to replace advice given to you by your health care provider. Make sure you discuss any questions you have with your health care provider.   Document Released: 10/24/2010 Document Revised: 05/01/2014 Document Reviewed: 03/12/2013 Elsevier Interactive Patient Education [employer].

## 2015-08-25 NOTE — Telephone Encounter (Signed)
LVM for pt to call back.

## 2015-08-25 NOTE — Telephone Encounter (Signed)
Patient called back. I informed her of what her labs said. She is happy with it. Thank you.

## 2015-10-19 ENCOUNTER — Other Ambulatory Visit: Payer: Self-pay | Admitting: *Deleted

## 2015-10-19 MED ORDER — VENLAFAXINE HCL ER 150 MG PO CP24: 150.0000 mg | ORAL_CAPSULE | Freq: Every day | ORAL | Status: DC

## 2015-11-03 ENCOUNTER — Other Ambulatory Visit (INDEPENDENT_AMBULATORY_CARE_PROVIDER_SITE_OTHER): Payer: BC Managed Care – PPO

## 2015-11-03 ENCOUNTER — Encounter: Payer: Self-pay | Admitting: Internal Medicine

## 2015-11-03 ENCOUNTER — Ambulatory Visit (INDEPENDENT_AMBULATORY_CARE_PROVIDER_SITE_OTHER): Payer: BC Managed Care – PPO | Admitting: Internal Medicine

## 2015-11-03 VITALS — BP 136/80 | HR 80 | Temp 98.4°F | Resp 20 | Wt 221.0 lb

## 2015-11-03 DIAGNOSIS — R109 Unspecified abdominal pain: Secondary | ICD-10-CM | POA: Insufficient documentation

## 2015-11-03 DIAGNOSIS — R1030 Lower abdominal pain, unspecified: Secondary | ICD-10-CM

## 2015-11-03 DIAGNOSIS — K921 Melena: Secondary | ICD-10-CM

## 2015-11-03 DIAGNOSIS — I10 Essential (primary) hypertension: Secondary | ICD-10-CM

## 2015-11-03 LAB — CBC WITH DIFFERENTIAL/PLATELET
Basophils Absolute: 0.1 10*3/uL (ref 0.0–0.1)
Basophils Relative: 1.2 % (ref 0.0–3.0)
Eosinophils Absolute: 0.2 10*3/uL (ref 0.0–0.7)
Eosinophils Relative: 3.4 % (ref 0.0–5.0)
HCT: 38 % (ref 36.0–46.0)
Hemoglobin: 12.3 g/dL (ref 12.0–15.0)
Lymphocytes Relative: 34.1 % (ref 12.0–46.0)
Lymphs Abs: 1.8 10*3/uL (ref 0.7–4.0)
MCHC: 32.4 g/dL (ref 30.0–36.0)
MCV: 84.7 fl (ref 78.0–100.0)
Monocytes Absolute: 0.4 10*3/uL (ref 0.1–1.0)
Monocytes Relative: 8.2 % (ref 3.0–12.0)
Neutro Abs: 2.9 10*3/uL (ref 1.4–7.7)
Neutrophils Relative %: 53.1 % (ref 43.0–77.0)
Platelets: 320 10*3/uL (ref 150.0–400.0)
RBC: 4.49 Mil/uL (ref 3.87–5.11)
RDW: 15.5 % (ref 11.5–15.5)
WBC: 5.4 10*3/uL (ref 4.0–10.5)

## 2015-11-03 LAB — LIPASE: Lipase: 1 U/L — ABNORMAL LOW (ref 11.0–59.0)

## 2015-11-03 LAB — URINALYSIS, ROUTINE W REFLEX MICROSCOPIC
Hgb urine dipstick: NEGATIVE
Ketones, ur: NEGATIVE
Leukocytes, UA: NEGATIVE
Nitrite: NEGATIVE
Specific Gravity, Urine: 1.02 (ref 1.000–1.030)
Urine Glucose: NEGATIVE
Urobilinogen, UA: 0.2 (ref 0.0–1.0)
pH: 6.5 (ref 5.0–8.0)

## 2015-11-03 LAB — HEPATIC FUNCTION PANEL
ALT: 9 U/L (ref 0–35)
AST: 14 U/L (ref 0–37)
Albumin: 4 g/dL (ref 3.5–5.2)
Alkaline Phosphatase: 93 U/L (ref 39–117)
Bilirubin, Direct: 0.1 mg/dL (ref 0.0–0.3)
Total Bilirubin: 0.3 mg/dL (ref 0.2–1.2)
Total Protein: 7.6 g/dL (ref 6.0–8.3)

## 2015-11-03 LAB — BASIC METABOLIC PANEL
BUN: 9 mg/dL (ref 6–23)
CO2: 32 mEq/L (ref 19–32)
Calcium: 9.3 mg/dL (ref 8.4–10.5)
Chloride: 111 mEq/L (ref 96–112)
Creatinine, Ser: 0.78 mg/dL (ref 0.40–1.20)
GFR: 96.24 mL/min (ref >60.00)
Glucose, Bld: 101 mg/dL — ABNORMAL HIGH (ref 70–99)
Potassium: 4.3 mEq/L (ref 3.5–5.1)
Sodium: 136 mEq/L (ref 135–145)

## 2015-11-03 NOTE — Progress Notes (Signed)
Subjective:    Patient ID: Suzanne [name], female    DOB: [birth year], 60 y.o.   MRN: [id number]  HPI  Here with c/o small volume BRBPR x 2 with one hard stool 2 days ago, assoc with sharp fleeting crampy lower abd pains for a few minutes after BM, without radiation, n/v, fever, and Denies worsening reflux, other abd pain, dysphagia, n/v, or diarrhea.  Denies urinary symptoms such as dysuria, frequency, urgency, flank pain, hematuria or n/v, fever, chills.   Last colonoscopy 2011 per Dr c/w diverticular dz, to f/u at 8 yrs. Pt denies chest pain, increased sob or doe, wheezing, orthopnea, PND, increased LE swelling, palpitations, dizziness or syncope.  Past Medical History  Diagnosis Date  . Anxiety   . OBESITY   . RAYNAUDS SYNDROME   . Diabetes mellitus, type 2 (HCC)   . HYPERTENSION   . GERD    Past Surgical History  Procedure Laterality Date  . Cholecystectomy  2000    reports that she has never smoked. She has never used smokeless tobacco. She reports that she does not drink alcohol or use illicit drugs. family history includes Allergies in her brother and son; Clotting disorder in her other; Colon cancer in her maternal aunt; Lung cancer in her father and maternal aunt. Allergies  Allergen Reactions  . Other Swelling    ALL SEAFOOD  . Shrimp [Shellfish Allergy] Swelling    Pt states she allergic to seafood   Current Outpatient Prescriptions on File Prior to Visit  Medication Sig Dispense Refill  . acyclovir (ZOVIRAX) 800 MG tablet Take 1 tablet (800 mg total) by mouth 5 (five) times daily. 35 tablet 0  . clotrimazole-betamethasone (LOTRISONE) cream Apply 1 application topically 2 (two) times daily. 30 g 0  . HYDROcodone-acetaminophen (NORCO) 7.5-325 MG per tablet Take 1 tablet by mouth 4 (four) times daily as needed for moderate pain or severe pain. 60 tablet 0  . ketoconazole (NIZORAL) 2 % cream Apply 1 application topically daily. 15 g 0  . metoprolol succinate  (TOPROL-XL) 100 MG 24 hr tablet Take 1 tablet (100 mg total) by mouth daily. Take with or immediately following a meal. 90 tablet 1  . triamcinolone cream (KENALOG) 0.1 % Apply 1 application topically 3 (three) times daily. 45 g 1  . venlafaxine XR (EFFEXOR-XR) 150 MG 24 hr capsule Take 1 capsule (150 mg total) by mouth daily with breakfast. 90 capsule 3   No current facility-administered medications on file prior to visit.   Review of Systems  Constitutional: Negative for unusual diaphoresis or night sweats HENT: Negative for ear swelling or discharge Eyes: Negative for worsening visual haziness  Respiratory: Negative for choking and stridor.   Gastrointestinal: Negative for distension or worsening eructation Genitourinary: Negative for retention or change in urine volume.  Musculoskeletal: Negative for other MSK pain or swelling Skin: Negative for color change and worsening wound Neurological: Negative for tremors and numbness other than noted  Psychiatric/Behavioral: Negative for decreased concentration or agitation other than above       Objective:   Physical Exam BP 136/80 mmHg  Pulse 80  Temp(Src) 98.4 F (36.9 C) (Oral)  Resp 20  Wt 221 lb (100.245 kg)  SpO2 98% VS noted,  Constitutional: Pt appears in no apparent distress HENT: Head: NCAT.  Right Ear: External ear normal.  Left Ear: External ear normal.  Eyes: . Pupils are equal, round, and reactive to light. Conjunctivae and EOM are normal Neck: Normal range of  motion. Neck supple.  Cardiovascular: Normal rate and regular rhythm.   Pulmonary/Chest: Effort normal and breath sounds without rales or wheezing.  Abd:  Soft, NT, ND, + BS DRE: declined Neurological: Pt is alert. Not confused , motor grossly intact Skin: Skin is warm. No rash, no LE edema Psychiatric: Pt behavior is normal. No agitation.     Assessment & Plan:

## 2015-11-03 NOTE — Progress Notes (Signed)
Pre visit review using our clinic review tool, if applicable. No additional management support is needed unless otherwise documented below in the visit note. 

## 2015-11-03 NOTE — Patient Instructions (Signed)
Please continue all other medications as before, and refills have been done if requested.  Please have the pharmacy call with any other refills you may need.  You can take OTC miralax or colace in the future if the constipations re-occurs  Please keep your appointments with your specialists as you may have planned  You will be contacted regarding the referral for: Gastroenterology  Please go to the LAB in the Basement (turn left off the elevator) for the tests to be done today  You will be contacted by phone if any changes need to be made immediately.  Otherwise, you will receive a letter about your results with an explanation, but please check with MyChart first.

## 2015-11-05 ENCOUNTER — Ambulatory Visit: Payer: BC Managed Care – PPO | Admitting: Family

## 2015-11-09 ENCOUNTER — Encounter: Payer: Self-pay | Admitting: Gastroenterology

## 2015-11-13 NOTE — Assessment & Plan Note (Signed)
stable overall by history and exam, recent data reviewed with pt, and pt to continue medical treatment as before,  to f/u any worsening symptoms or concerns BP Readings from Last 3 Encounters:  11/03/15 136/80  08/23/15 124/82  11/02/14 150/88

## 2015-11-13 NOTE — Assessment & Plan Note (Signed)
Small volume, for labs as ordered, ? Hemorrhoid vs diverticular, for GI referral,  to f/u any worsening symptoms or concerns

## 2015-11-13 NOTE — Assessment & Plan Note (Addendum)
Etiology unclear, suspect related to consitpation, for colace asd, for UA and labs,  to f/u any worsening symptoms or concerns

## 2016-01-14 ENCOUNTER — Encounter (INDEPENDENT_AMBULATORY_CARE_PROVIDER_SITE_OTHER): Payer: Self-pay

## 2016-01-14 ENCOUNTER — Encounter: Payer: Self-pay | Admitting: Gastroenterology

## 2016-01-14 ENCOUNTER — Ambulatory Visit (INDEPENDENT_AMBULATORY_CARE_PROVIDER_SITE_OTHER): Payer: BC Managed Care – PPO | Admitting: Gastroenterology

## 2016-01-14 VITALS — BP 116/76 | HR 72 | Ht 63.0 in | Wt 224.4 lb

## 2016-01-14 DIAGNOSIS — K921 Melena: Secondary | ICD-10-CM

## 2016-01-14 DIAGNOSIS — K5730 Diverticulosis of large intestine without perforation or abscess without bleeding: Secondary | ICD-10-CM

## 2016-01-14 DIAGNOSIS — R1033 Periumbilical pain: Secondary | ICD-10-CM | POA: Diagnosis not present

## 2016-01-14 MED ORDER — NA SULFATE-K SULFATE-MG SULF 17.5-3.13-1.6 GM/177ML PO SOLN: ORAL | 0 refills | Status: DC

## 2016-01-14 NOTE — Progress Notes (Signed)
    History of Present Illness: This is a 60 year old female referred by Corwin LevinsJohn, James W, MD for the evaluation of hematochezia. She previously underwent colonoscopy in 07/2009 showing only diverticulosis. She had small volume bright red rectal bleeding associated with periumbilical pain for several days in July. None since.  Denies weight loss, constipation, diarrhea, change in stool caliber, melena, nausea, vomiting, dysphagia, reflux symptoms, chest pain.  Review of Systems: Pertinent positive and negative review of systems were noted in the above HPI section. All other review of systems were otherwise negative.  Current Medications, Allergies, Past Medical History, Past Surgical History, Family History and Social History were [facility].  Physical Exam: General: Well developed, well nourished, no acute distress Head: Normocephalic and atraumatic Eyes:  sclerae anicteric, EOMI Ears: Normal auditory acuity Mouth: No deformity or lesions Neck: Supple, no masses or thyromegaly Lungs: Clear throughout to auscultation Heart: Regular rate and rhythm; no murmurs, rubs or bruits Abdomen: Soft, non tender and non distended. No masses, hepatosplenomegaly or hernias noted. Normal Bowel sounds Rectal: deferred to colonoscopy Musculoskeletal: Symmetrical with no gross deformities  Skin: No lesions on visible extremities Pulses:  Normal pulses noted Extremities: No clubbing, cyanosis, edema or deformities noted Neurological: Alert oriented x 4, grossly nonfocal Cervical Nodes:  No significant cervical adenopathy Inguinal Nodes: No significant inguinal adenopathy Psychological:  Alert and cooperative. Normal mood and affect  Assessment and Recommendations:  1. Hematochezia, resolved. Diverticulosis. Periumbilical abdominal pain, resolved. Likely a benign anorectal source of hemoatochezia or a self limited colitis however need to exclude colorectal neoplasms  and other disorders. Increase dietary fiber and water intake. Schedule colonoscopy. The risks (including bleeding, perforation, infection, missed lesions, medication reactions and possible hospitalization or surgery if complications occur), benefits, and alternatives to colonoscopy with possible biopsy and possible polypectomy were discussed with the patient and they consent to proceed.    cc: Corwin LevinsJames W John, MD [address] [place] [place], [place] [id number]

## 2016-01-14 NOTE — Patient Instructions (Signed)
You have been scheduled for a colonoscopy. Please follow written instructions given to you at your visit today.  Please pick up your prep supplies at the pharmacy within the next 1-3 days. If you use inhalers (even only as needed), please bring them with you on the day of your procedure. Your physician has requested that you go to www.startemmi.com and enter the access code given to you at your visit today. This web site gives a general overview about your procedure. However, you should still follow specific instructions given to you by our office regarding your preparation for the procedure.  If you are age 62 or older, your body mass index should be between 23-30. Your Body mass index is 39.75 kg/m. If this is out of the aforementioned range listed, please consider follow up with your Primary Care Provider.  If you are age 62 or younger, your body mass index should be between 19-25. Your Body mass index is 39.75 kg/m. If this is out of the aformentioned range listed, please consider follow up with your Primary Care Provider.

## 2016-02-16 ENCOUNTER — Other Ambulatory Visit: Payer: Self-pay | Admitting: Family

## 2016-02-16 DIAGNOSIS — I10 Essential (primary) hypertension: Secondary | ICD-10-CM

## 2016-02-18 ENCOUNTER — Encounter: Payer: Self-pay | Admitting: Gastroenterology

## 2016-03-02 ENCOUNTER — Ambulatory Visit (AMBULATORY_SURGERY_CENTER): Payer: BC Managed Care – PPO | Admitting: Gastroenterology

## 2016-03-02 ENCOUNTER — Encounter: Payer: Self-pay | Admitting: Gastroenterology

## 2016-03-02 VITALS — BP 148/74 | HR 62 | Temp 98.2°F | Resp 23 | Ht 63.0 in | Wt 224.0 lb

## 2016-03-02 DIAGNOSIS — K921 Melena: Secondary | ICD-10-CM

## 2016-03-02 MED ORDER — SODIUM CHLORIDE 0.9 % IV SOLN: 500.0000 mL | INTRAVENOUS | Status: DC

## 2016-03-02 NOTE — Progress Notes (Signed)
Patient awakening,vss,report to rn 

## 2016-03-02 NOTE — Patient Instructions (Signed)
Impression/Recommendations:  Diverticulosis handout given to patient.  High fiber diet.  Repeat colonoscopy in 10 years for screening.  YOU HAD AN ENDOSCOPIC PROCEDURE TODAY AT THE [facility]:   Refer to the procedure report that was given to you for any specific questions about what was found during the examination.  If the procedure report does not answer your questions, please call your gastroenterologist to clarify.  If you requested that your care partner not be given the details of your procedure findings, then the procedure report has been included in a sealed envelope for you to review at your convenience later.  YOU SHOULD EXPECT: Some feelings of bloating in the abdomen. Passage of more gas than usual.  Walking can help get rid of the air that was put into your GI tract during the procedure and reduce the bloating. If you had a lower endoscopy (such as a colonoscopy or flexible sigmoidoscopy) you may notice spotting of blood in your stool or on the toilet paper. If you underwent a bowel prep for your procedure, you may not have a normal bowel movement for a few days.  Please Note:  You might notice some irritation and congestion in your nose or some drainage.  This is from the oxygen used during your procedure.  There is no need for concern and it should clear up in a day or so.  SYMPTOMS TO REPORT IMMEDIATELY:   Following lower endoscopy (colonoscopy or flexible sigmoidoscopy):  Excessive amounts of blood in the stool  Significant tenderness or worsening of abdominal pains  Swelling of the abdomen that is new, acute  Fever of 100F or higher For urgent or emergent issues, a gastroenterologist can be reached at any hour by calling [phone].   DIET:  We do recommend a small meal at first, but then you may proceed to your regular diet.  Drink plenty of fluids but you should avoid alcoholic beverages for 24 hours.  ACTIVITY:  You should plan to take it easy for  the rest of today and you should NOT DRIVE or use heavy machinery until tomorrow (because of the sedation medicines used during the test).    FOLLOW UP: Our staff will call the number listed on your records the next business day following your procedure to check on you and address any questions or concerns that you may have regarding the information given to you following your procedure. If we do not reach you, we will leave a message.  However, if you are feeling well and you are not experiencing any problems, there is no need to return our call.  We will assume that you have returned to your regular daily activities without incident.  If any biopsies were taken you will be contacted by phone or by letter within the next 1-3 weeks.  Please call us at [phone] if you have not heard about the biopsies in 3 weeks.    SIGNATURES/CONFIDENTIALITY: You and/or your care partner have signed paperwork which will be entered into your electronic medical record.  These signatures attest to the fact that that the information above on your After Visit Summary has been reviewed and is understood.  Full responsibility of the confidentiality of this discharge information lies with you and/or your care-partner.

## 2016-03-02 NOTE — Op Note (Signed)
[facility] Patient Name: Suzanne [name] Procedure Date: 03/02/2016 2:15 PM MRN: [id number] Endoscopist: Meryl Dare , MD Age: 60 Referring MD:  Date of Birth: Sep 26, 1955 Gender: Female Account #: [id number] Procedure:                Colonoscopy Indications:              Hematochezia Medicines:                Monitored Anesthesia Care Procedure:                Pre-Anesthesia Assessment:                           - Prior to the procedure, a History and Physical                            was performed, and patient medications and                            allergies were reviewed. The patient's tolerance of                            previous anesthesia was also reviewed. The risks                            and benefits of the procedure and the sedation                            options and risks were discussed with the patient.                            All questions were answered, and informed consent                            was obtained. Prior Anticoagulants: The patient has                            taken no previous anticoagulant or antiplatelet                            agents. ASA Grade Assessment: II - A patient with                            mild systemic disease. After reviewing the risks                            and benefits, the patient was deemed in                            satisfactory condition to undergo the procedure.                           After obtaining informed consent, the colonoscope  was passed under direct vision. Throughout the                            procedure, the patient's blood pressure, pulse, and                            oxygen saturations were monitored continuously. The                            Model [id number]-[id number]) scope was introduced                            through the anus and advanced to the the cecum,                            identified by appendiceal orifice and  ileocecal                            valve. The ileocecal valve, appendiceal orifice,                            and rectum were photographed. The quality of the                            bowel preparation was adequate. The colonoscopy was                            performed without difficulty. The patient tolerated                            the procedure well. Scope In: 2:29:53 PM Scope Out: 2:42:09 PM Scope Withdrawal Time: 0 hours 9 minutes 47 seconds  Total Procedure Duration: 0 hours 12 minutes 16 seconds  Findings:                 The perianal and digital rectal examinations were                            normal.                           Multiple medium-mouthed diverticula were found in                            the sigmoid colon and descending colon. There was                            narrowing of the colon in association with the                            diverticular opening. There was evidence of                            diverticular spasm. There was no evidence of  diverticular bleeding.                           Many small-mouthed diverticula were found in the                            transverse colon. There was no evidence of                            diverticular bleeding.                           The exam was otherwise without abnormality on                            direct and retroflexion views. Complications:            No immediate complications. Estimated blood loss:                            None. Estimated Blood Loss:     Estimated blood loss: none. Impression:               - Moderate diverticulosis in the sigmoid colon and                            in the descending colon. There was narrowing of the                            colon in association with the diverticular opening.                            There was evidence of diverticular spasm. There was                            no evidence of diverticular bleeding.                            - Mild diverticulosis in the transverse colon.                            There was no evidence of diverticular bleeding.                           - The examination was otherwise normal on direct                            and retroflexion views.                           - No specimens collected. Recommendation:           - Repeat colonoscopy in 10 years for screening                            purposes.                           -  Patient has a contact number available for                            emergencies. The signs and symptoms of potential                            delayed complications were discussed with the                            patient. Return to normal activities tomorrow.                            Written discharge instructions were provided to the                            patient.                           - Continue present medications.                           - High fiber diet indefinitely. Meryl DareMalcolm T Stark, MD 03/02/2016 2:49:39 PM This report has been signed electronically.

## 2016-03-03 ENCOUNTER — Telehealth: Payer: Self-pay | Admitting: *Deleted

## 2016-03-03 NOTE — Telephone Encounter (Signed)
  Follow up Call-  Call back number 03/02/2016  Post procedure Call Back phone  # [phone]  Permission to leave phone message Yes  Some recent data might be hidden     Patient questions:  Do you have a fever, pain , or abdominal swelling? No. Pain Score  0 *  Have you tolerated food without any problems? Yes.    Have you been able to return to your normal activities? No.  Do you have any questions about your discharge instructions: Diet   No. Medications  No. Follow up visit  No.  Do you have questions or concerns about your Care? No.  Actions: * If pain score is 4 or above: No action needed, pain <4.

## 2016-08-03 ENCOUNTER — Telehealth: Payer: Self-pay | Admitting: Family

## 2016-08-03 NOTE — Telephone Encounter (Signed)
Have not seen pt since 08/23/15 please advise

## 2016-08-03 NOTE — Telephone Encounter (Signed)
Pt called wanting to see if she could be referred to a foot specialist. She said that in between her toes and on the bottom of her foot she is itching and her skin is splitting. She also said that she is having leg pains at night. She said that she wanted to know if the referral could be done without her having to come in to see Tammy Sours because she can not afford to come here also. Please advise.

## 2016-08-07 NOTE — Telephone Encounter (Signed)
Pt is scheduled for an office visit on 08/10/16 @ 4.

## 2016-08-07 NOTE — Telephone Encounter (Signed)
Please have her schedule an office visit  

## 2016-08-10 ENCOUNTER — Encounter: Payer: Self-pay | Admitting: Family

## 2016-08-10 ENCOUNTER — Ambulatory Visit (INDEPENDENT_AMBULATORY_CARE_PROVIDER_SITE_OTHER): Payer: BC Managed Care – PPO | Admitting: Family

## 2016-08-10 VITALS — BP 122/66 | HR 77 | Temp 98.2°F | Resp 16 | Ht 63.0 in | Wt 212.0 lb

## 2016-08-10 DIAGNOSIS — G2581 Restless legs syndrome: Secondary | ICD-10-CM

## 2016-08-10 DIAGNOSIS — B353 Tinea pedis: Secondary | ICD-10-CM | POA: Diagnosis not present

## 2016-08-10 MED ORDER — CLOTRIMAZOLE 1 % EX CREA: 1.0000 "application " | TOPICAL_CREAM | Freq: Two times a day (BID) | CUTANEOUS | 0 refills | Status: DC

## 2016-08-10 NOTE — Assessment & Plan Note (Signed)
Calf/ankle pain with concern for restless leg syndrome. There is concern for claudication and will obtain ankle brachial index to rule out peripheral artery disease. Treat conservatively with ice, anti-inflammatories, and home exercise therapy. Follow-up if symptoms worsen or do not improve. Consider treatment for restless leg syndrome if symptoms worsen.

## 2016-08-10 NOTE — Progress Notes (Signed)
Subjective:    Patient ID: Suzanne [name], female    DOB: Apr 18, 1956, 61 y.o.   MRN: [id number]  Chief Complaint  Patient presents with  . Foot Pain    left foot pain, issues with feet getting darker     HPI:  Suzanne [name] is a 61 y.o. female who  has a past medical history of Anxiety; Chronic headaches; Depression; Diabetes mellitus, type 2 (HCC); Diverticulosis; Gallstones; GERD; Hepatitis B; HYPERTENSION; OBESITY; RAYNAUDS SYNDROME; Seizures (HCC); and Stammering. and presents today for an acute office visit.  This is a new problem. Associated symptom of pain located in her left foot and lower leg that is described as an aching feeling. Timing of the symptoms is generally worse at night and with activity. Denies improvement with rest. No chest pain or shortness of breath. Describes skin breakdown between toes. No calf pain. No trauma or injury.   Allergies  Allergen Reactions  . Other Swelling    ALL SEAFOOD  . Shrimp [Shellfish Allergy] Swelling    Pt states she allergic to seafood    Wt Readings from Last 3 Encounters:  08/10/16 212 lb (96.2 kg)  03/02/16 224 lb (101.6 kg)  01/14/16 224 lb 6 oz (101.8 kg)     Outpatient Medications Prior to Visit  Medication Sig Dispense Refill  . metoprolol succinate (TOPROL-XL) 100 MG 24 hr tablet TAKE ONE TABLET BY MOUTH ONCE DAILY WITH MEAL OR IMMEDIATELY FOLLOWING A MEAL 90 tablet 1  . venlafaxine XR (EFFEXOR-XR) 150 MG 24 hr capsule Take 1 capsule (150 mg total) by mouth daily with breakfast. 90 capsule 3   Facility-Administered Medications Prior to Visit  Medication Dose Route Frequency Provider Last Rate Last Dose  . 0.9 %  sodium chloride infusion  500 mL Intravenous Continuous Meryl Dare, MD          Past Surgical History:  Procedure Laterality Date  . CHOLECYSTECTOMY  2000  . COLONOSCOPY    . OTHER SURGICAL HISTORY  2000   ? if for diverticulosis, pt states they went through beely button       Past Medical History:  Diagnosis Date  . Anxiety   . Chronic headaches   . Depression   . Diabetes mellitus, type 2 (HCC)   . Diverticulosis   . Gallstones   . GERD   . Hepatitis B   . HYPERTENSION   . OBESITY   . RAYNAUDS SYNDROME   . Seizures [facility])    age 57 only one time  . Stammering       Review of Systems  Constitutional: Negative for chills and fever.  Respiratory: Negative for chest tightness and shortness of breath.   Cardiovascular: Negative for chest pain.  Neurological: Negative for weakness and numbness.      Objective:    BP 122/66 (BP Location: Left Arm, Patient Position: Sitting, Cuff Size: Large)   Pulse 77   Temp 98.2 F (36.8 C) (Oral)   Resp 16   Ht  (1.6 m)   Wt 212 lb (96.2 kg)   SpO2 98%   BMI 37.55 kg/m  Nursing note and vital signs reviewed.  Physical Exam  Constitutional: She is oriented to person, place, and time. She appears well-developed and well-nourished. No distress.  Cardiovascular: Normal rate, regular rhythm, normal heart sounds and intact distal pulses.   Pulmonary/Chest: Effort normal and breath sounds normal.  Musculoskeletal:  Left foot/ankle - no obvious deformity or edema. There is  mild skin breakdown between all 5 toes no distinct odor. No palpable tenderness able to be elicited in the foot/ankle/calf. No deformity or crepitus. Range of motion within normal limits. Pulses and sensation are intact and appropriate.  Neurological: She is alert and oriented to person, place, and time.  Skin: Skin is warm and dry.  Psychiatric: She has a normal mood and affect. Her behavior is normal. Judgment and thought content normal.       Assessment & Plan:   Problem List Items Addressed This Visit      Musculoskeletal and Integument   Tinea pedis - Primary    Symptoms and exam are consistent with tinea pedis. Start clotrimazole. Follow-up if symptoms worsen or do not improve.      Relevant Medications    clotrimazole (LOTRIMIN) 1 % cream     Other   RESTLESS LEG SYNDROME    Calf/ankle pain with concern for restless leg syndrome. There is concern for claudication and will obtain ankle brachial index to rule out peripheral artery disease. Treat conservatively with ice, anti-inflammatories, and home exercise therapy. Follow-up if symptoms worsen or do not improve. Consider treatment for restless leg syndrome if symptoms worsen.      Relevant Orders   VAS [place] ABI WITH/WO TBI       I am having Ms. Chisolm start on clotrimazole. I am also having her maintain her venlafaxine XR and metoprolol succinate. We will continue to administer sodium chloride. We will get  Meds ordered this encounter  Medications  . clotrimazole (LOTRIMIN) 1 % cream    Sig: Apply 1 application topically 2 (two) times daily.    Dispense:  30 g    Refill:  0    Order Specific Question:   Supervising Provider    Answer:   Hillard Danker A [[id number]]     Follow-up: Return if symptoms worsen or fail to improve.  Jeanine Luz, FNP

## 2016-08-10 NOTE — Assessment & Plan Note (Signed)
Symptoms and exam are consistent with tinea pedis. Start clotrimazole. Follow-up if symptoms worsen or do not improve.

## 2016-08-10 NOTE — Patient Instructions (Signed)
Thank you for choosing [facility].  SUMMARY AND INSTRUCTIONS:  Ice your leg x 20 minutes before bed.   Ibuprofen or aleve as needed.  We can consider treatment for restless leg if this does not improve.  Medication:  Your prescription(s) have been submitted to your pharmacy or been printed and provided for you. Please take as directed and contact our office if you believe you are having problem(s) with the medication(s) or have any questions.  Follow up:  If your symptoms worsen or fail to improve, please contact our office for further instruction, or in case of emergency go directly to the emergency room at the closest medical facility.    Restless Legs Syndrome Restless legs syndrome is a condition that causes uncomfortable feelings or sensations in the legs, especially while sitting or lying down. The sensations usually cause an overwhelming urge to move the legs. The arms can also sometimes be affected. The condition can range from mild to severe. The symptoms often interfere with a person's ability to sleep. What are the causes? The cause of this condition is not known. What increases the risk? This condition is more likely to develop in:  People who are older than age 66.  Pregnant women. In general, restless legs syndrome is more common in women than in men.  People who have a family history of the condition.  People who have certain medical conditions, such as iron deficiency, kidney disease, Parkinson disease, or nerve damage.  People who take certain medicines, such as medicines for high blood pressure, nausea, colds, allergies, depression, and some heart conditions. What are the signs or symptoms? The main symptom of this condition is uncomfortable sensations in the legs. These sensations may be:  Described as pulling, tingling, prickling, throbbing, crawling, or burning.  Worse while you are sitting or lying down.  Worse during periods of rest or  inactivity.  Worse at night, often interfering with your sleep.  Accompanied by a very strong urge to move your legs.  Temporarily relieved by movement of your legs. The sensations usually affect both sides of the body. The arms can also be affected, but this is rare. People who have this condition often have tiredness during the day because of their lack of sleep at night. How is this diagnosed? This condition may be diagnosed based on your description of the symptoms. You may also have tests, including blood tests, to check for other conditions that may lead to your symptoms. In some cases, you may be asked to spend some time in a sleep lab so your sleeping can be monitored. How is this treated? Treatment for this condition is focused on managing the symptoms. Treatment may include:  Self-help and lifestyle changes.  Medicines. Follow these instructions at home:  Take medicines only as directed by your health care provider.  Try these methods to get temporary relief from the uncomfortable sensations:  Massage your legs.  Walk or stretch.  Take a cold or hot bath.  Practice good sleep habits. For example, go to bed and get up at the same time every day.  Exercise regularly.  Practice ways of relaxing, such as yoga or meditation.  Avoid caffeine and alcohol.  Do not use any tobacco products, including cigarettes, chewing tobacco, or electronic cigarettes. If you need help quitting, ask your health care provider.  Keep all follow-up visits as directed by your health care provider. This is important. Contact a health care provider if: Your symptoms do not improve  with treatment, or they get worse. This information is not intended to replace advice given to you by your health care provider. Make sure you discuss any questions you have with your health care provider. Document Released: 03/31/2002 Document Revised: 09/16/2015 Document Reviewed: 04/06/2014 Elsevier Interactive  Patient Education  2017 [employer]   Athlete's Foot Athlete's foot (tinea pedis) is a fungal infection of the skin on the feet. It often occurs on the skin that is between or underneath the toes. It can also occur on the soles of the feet. The infection can spread from person to person (is contagious). Follow these instructions at home:  Apply or take over-the-counter and prescription medicines only as told by your doctor.  Keep all follow-up visits as told by your doctor. This is important.  Do not scratch your feet.  Keep your feet dry:  Wear cotton or wool socks. Change your socks every day or if they become wet.  Wear shoes that allow air to move around, such as sandals or canvas tennis shoes.  Wash and dry your feet:  Every day or as told by your doctor.  After exercising.  Including the area between your toes.  Wear sandals in wet areas, such as locker rooms and shared showers.  Do not share any of these items:  Towels.  Nail clippers.  Other personal items that touch your feet.  If you have diabetes, keep your blood sugar under control. Contact a doctor if:  You have a fever.  You have swelling, soreness, warmth, or redness in your foot.  You are not getting better with treatment.  Your symptoms get worse.  You have new symptoms. This information is not intended to replace advice given to you by your health care provider. Make sure you discuss any questions you have with your health care provider. Document Released: 09/27/2007 Document Revised: 09/16/2015 Document Reviewed: 10/12/2014 Elsevier Interactive Patient Education  2017 [employer].

## 2016-08-16 ENCOUNTER — Telehealth (HOSPITAL_COMMUNITY): Payer: Self-pay | Admitting: Family

## 2016-08-23 ENCOUNTER — Other Ambulatory Visit: Payer: Self-pay | Admitting: Family

## 2016-08-23 DIAGNOSIS — I10 Essential (primary) hypertension: Secondary | ICD-10-CM

## 2016-08-25 NOTE — Telephone Encounter (Signed)
  08/16/2016 02:03 PM Phone (Outgoing) [name], [name] (Self) [phone] (M)   Left Message - Called pt and lmsg for her to CB to sch vascular test.     By Elita Booneegina A Lilyian Quayle

## 2016-09-06 ENCOUNTER — Encounter: Payer: Self-pay | Admitting: Gynecology

## 2016-12-18 ENCOUNTER — Other Ambulatory Visit: Payer: Self-pay | Admitting: Family

## 2017-03-07 ENCOUNTER — Telehealth: Payer: Self-pay | Admitting: Nurse Practitioner

## 2017-03-07 DIAGNOSIS — I10 Essential (primary) hypertension: Secondary | ICD-10-CM

## 2017-03-07 MED ORDER — VENLAFAXINE HCL ER 150 MG PO CP24: ORAL_CAPSULE | ORAL | 0 refills | Status: DC

## 2017-03-07 MED ORDER — METOPROLOL SUCCINATE ER 100 MG PO TB24: ORAL_TABLET | ORAL | 0 refills | Status: DC

## 2017-03-07 NOTE — Telephone Encounter (Signed)
Per office policy sent enough meds to local pharmacy until appt...Raechel Chute/lmb

## 2017-03-07 NOTE — Telephone Encounter (Signed)
Patient scheduled to transfer 1/21 to [facility].  Patient is [facility] [address]

## 2017-05-14 ENCOUNTER — Encounter: Payer: Self-pay | Admitting: Nurse Practitioner

## 2017-05-14 ENCOUNTER — Other Ambulatory Visit (INDEPENDENT_AMBULATORY_CARE_PROVIDER_SITE_OTHER): Payer: BC Managed Care – PPO

## 2017-05-14 ENCOUNTER — Ambulatory Visit (INDEPENDENT_AMBULATORY_CARE_PROVIDER_SITE_OTHER): Payer: BC Managed Care – PPO | Admitting: Nurse Practitioner

## 2017-05-14 VITALS — BP 124/80 | HR 72 | Temp 98.4°F | Resp 16 | Ht 63.0 in | Wt 220.0 lb

## 2017-05-14 DIAGNOSIS — E119 Type 2 diabetes mellitus without complications: Secondary | ICD-10-CM | POA: Diagnosis not present

## 2017-05-14 DIAGNOSIS — Z0001 Encounter for general adult medical examination with abnormal findings: Secondary | ICD-10-CM | POA: Diagnosis not present

## 2017-05-14 DIAGNOSIS — Z1239 Encounter for other screening for malignant neoplasm of breast: Secondary | ICD-10-CM

## 2017-05-14 DIAGNOSIS — I10 Essential (primary) hypertension: Secondary | ICD-10-CM | POA: Diagnosis not present

## 2017-05-14 DIAGNOSIS — Z13220 Encounter for screening for lipoid disorders: Secondary | ICD-10-CM | POA: Diagnosis not present

## 2017-05-14 DIAGNOSIS — R079 Chest pain, unspecified: Secondary | ICD-10-CM

## 2017-05-14 DIAGNOSIS — Z1231 Encounter for screening mammogram for malignant neoplasm of breast: Secondary | ICD-10-CM

## 2017-05-14 LAB — COMPREHENSIVE METABOLIC PANEL
ALT: 11 U/L (ref 0–35)
AST: 15 U/L (ref 0–37)
Albumin: 3.9 g/dL (ref 3.5–5.2)
Alkaline Phosphatase: 92 U/L (ref 39–117)
BUN: 16 mg/dL (ref 6–23)
CO2: 31 mEq/L (ref 19–32)
Calcium: 9.1 mg/dL (ref 8.4–10.5)
Chloride: 104 mEq/L (ref 96–112)
Creatinine, Ser: 0.77 mg/dL (ref 0.40–1.20)
GFR: 97.2 mL/min (ref >60.00)
Glucose, Bld: 89 mg/dL (ref 70–99)
Potassium: 4.5 mEq/L (ref 3.5–5.1)
Sodium: 139 mEq/L (ref 135–145)
Total Bilirubin: 0.3 mg/dL (ref 0.2–1.2)
Total Protein: 7 g/dL (ref 6.0–8.3)

## 2017-05-14 LAB — CBC WITH DIFFERENTIAL/PLATELET
Basophils Absolute: 0.1 10*3/uL (ref 0.0–0.1)
Basophils Relative: 1.1 % (ref 0.0–3.0)
Eosinophils Absolute: 0.2 10*3/uL (ref 0.0–0.7)
Eosinophils Relative: 4.3 % (ref 0.0–5.0)
HCT: 38.5 % (ref 36.0–46.0)
Hemoglobin: 12.8 g/dL (ref 12.0–15.0)
Lymphocytes Relative: 22.3 % (ref 12.0–46.0)
Lymphs Abs: 1.2 10*3/uL (ref 0.7–4.0)
MCHC: 33.3 g/dL (ref 30.0–36.0)
MCV: 88 fl (ref 78.0–100.0)
Monocytes Absolute: 0.4 10*3/uL (ref 0.1–1.0)
Monocytes Relative: 7.5 % (ref 3.0–12.0)
Neutro Abs: 3.5 10*3/uL (ref 1.4–7.7)
Neutrophils Relative %: 64.8 % (ref 43.0–77.0)
Platelets: 280 10*3/uL (ref 150.0–400.0)
RBC: 4.38 Mil/uL (ref 3.87–5.11)
RDW: 14.5 % (ref 11.5–15.5)
WBC: 5.3 10*3/uL (ref 4.0–10.5)

## 2017-05-14 LAB — HEMOGLOBIN A1C: Hgb A1c MFr Bld: 6.3 % (ref 4.6–6.5)

## 2017-05-14 LAB — MICROALBUMIN / CREATININE URINE RATIO
Creatinine,U: 132.3 mg/dL
Microalb Creat Ratio: 0.7 mg/g (ref 0.0–30.0)
Microalb, Ur: 0.9 mg/dL (ref 0.0–1.9)

## 2017-05-14 LAB — LIPID PANEL
Cholesterol: 136 mg/dL (ref 0–200)
HDL: 47 mg/dL (ref >39.00)
LDL Cholesterol: 80 mg/dL (ref 0–99)
NonHDL: 88.91
Total CHOL/HDL Ratio: 3
Triglycerides: 45 mg/dL (ref 0.0–149.0)
VLDL: 9 mg/dL (ref 0.0–40.0)

## 2017-05-14 LAB — TSH: TSH: 1.77 u[IU]/mL (ref 0.35–4.50)

## 2017-05-14 NOTE — Patient Instructions (Addendum)
Please head downstairs for lab work.  Please remember the goal of 150 minutes of physical activity weekly, eat two servings of fish weekly, eat one serving of tree nuts ( cashews, pistachios, pecans, almonds.Marland Kitchen) every other day, eat 6 servings of fruit/vegetables daily and drink plenty of water and avoid sweet beverages.   Id like to see you back in about 6 months for routine follow up, or sooner if you need me.  It was nice to meet you. Thanks for letting me take care of you today :)  Preventive Care 40-64 Years, Female Preventive care refers to lifestyle choices and visits with your health care provider that can promote health and wellness. What does preventive care include?  A yearly physical exam. This is also called an annual well check.  Dental exams once or twice a year.  Routine eye exams. Ask your health care provider how often you should have your eyes checked.  Personal lifestyle choices, including: ? Daily care of your teeth and gums. ? Regular physical activity. ? Eating a healthy diet. ? Avoiding tobacco and drug use. ? Limiting alcohol use. ? Practicing safe sex. ? Taking low-dose aspirin daily starting at age 44. ? Taking vitamin and mineral supplements as recommended by your health care provider. What happens during an annual well check? The services and screenings done by your health care provider during your annual well check will depend on your age, overall health, lifestyle risk factors, and family history of disease. Counseling Your health care provider may ask you questions about your:  Alcohol use.  Tobacco use.  Drug use.  Emotional well-being.  Home and relationship well-being.  Sexual activity.  Eating habits.  Work and work Statistician.  Method of birth control.  Menstrual cycle.  Pregnancy history.  Screening You may have the following tests or measurements:  Height, weight, and BMI.  Blood pressure.  Lipid and cholesterol  levels. These may be checked every 5 years, or more frequently if you are over 58 years old.  Skin check.  Lung cancer screening. You may have this screening every year starting at age 29 if you have a 30-pack-year history of smoking and currently smoke or have quit within the past 15 years.  Fecal occult blood test (FOBT) of the stool. You may have this test every year starting at age 38.  Flexible sigmoidoscopy or colonoscopy. You may have a sigmoidoscopy every 5 years or a colonoscopy every 10 years starting at age 48.  Hepatitis C blood test.  Hepatitis B blood test.  Sexually transmitted disease (STD) testing.  Diabetes screening. This is done by checking your blood sugar (glucose) after you have not eaten for a while (fasting). You may have this done every 1-3 years.  Mammogram. This may be done every 1-2 years. Talk to your health care provider about when you should start having regular mammograms. This may depend on whether you have a family history of breast cancer.  BRCA-related cancer screening. This may be done if you have a family history of breast, ovarian, tubal, or peritoneal cancers.  Pelvic exam and Pap test. This may be done every 3 years starting at age 30. Starting at age 42, this may be done every 5 years if you have a Pap test in combination with an HPV test.  Bone density scan. This is done to screen for osteoporosis. You may have this scan if you are at high risk for osteoporosis.  Discuss your test results, treatment options, and  if necessary, the need for more tests with your health care provider. Vaccines Your health care provider may recommend certain vaccines, such as:  Influenza vaccine. This is recommended every year.  Tetanus, diphtheria, and acellular pertussis (Tdap, Td) vaccine. You may need a Td booster every 10 years.  Varicella vaccine. You may need this if you have not been vaccinated.  Zoster vaccine. You may need this after age  60.  Measles, mumps, and rubella (MMR) vaccine. You may need at least one dose of MMR if you were born in 1957 or later. You may also need a second dose.  Pneumococcal 13-valent conjugate (PCV13) vaccine. You may need this if you have certain conditions and were not previously vaccinated.  Pneumococcal polysaccharide (PPSV23) vaccine. You may need one or two doses if you smoke cigarettes or if you have certain conditions.  Meningococcal vaccine. You may need this if you have certain conditions.  Hepatitis A vaccine. You may need this if you have certain conditions or if you travel or work in places where you may be exposed to hepatitis A.  Hepatitis B vaccine. You may need this if you have certain conditions or if you travel or work in places where you may be exposed to hepatitis B.  Haemophilus influenzae type b (Hib) vaccine. You may need this if you have certain conditions.  Talk to your health care provider about which screenings and vaccines you need and how often you need them. This information is not intended to replace advice given to you by your health care provider. Make sure you discuss any questions you have with your health care provider. Document Released: 05/07/2015 Document Revised: 12/29/2015 Document Reviewed: 02/09/2015 Elsevier Interactive Patient Education  2018 [place].   Nonspecific Chest Pain Chest pain can be caused by many different conditions. There is a chance that your pain could be related to something serious, such as a heart attack or a blood clot in your lungs. Chest pain can also be caused by conditions that are not life-threatening. If you have chest pain, it is very important to follow up with your doctor. Follow these instructions at home: Medicines  If you were prescribed an antibiotic medicine, take it as told by your doctor. Do not stop taking the antibiotic even if you start to feel better.  Take over-the-counter and prescription medicines  only as told by your doctor. Lifestyle  Do not use any products that contain nicotine or tobacco, such as cigarettes and e-cigarettes. If you need help quitting, ask your doctor.  Do not drink alcohol.  Make lifestyle changes as told by your doctor. These may include: ? Getting regular exercise. Ask your doctor for some activities that are safe for you. ? Eating a heart-healthy diet. A diet specialist (dietitian) can help you to learn healthy eating options. ? Staying at a healthy weight. ? Managing diabetes, if needed. ? Lowering your stress, as with deep breathing or spending time in nature. General instructions  Avoid any activities that make you feel chest pain.  If your chest pain is because of heartburn: ? Raise (elevate) the head of your bed about 6 inches (15 cm). You can do this by putting blocks under the bed legs at the head of the bed. ? Do not sleep with extra pillows under your head. That does not help heartburn.  Keep all follow-up visits as told by your doctor. This is important. This includes any further testing if your chest pain does not go  away. Contact a doctor if:  Your chest pain does not go away.  You have a rash with blisters on your chest.  You have a fever.  You have chills. Get help right away if:  Your chest pain is worse.  You have a cough that gets worse, or you cough up blood.  You have very bad (severe) pain in your belly (abdomen).  You are very weak.  You pass out (faint).  You have either of these for no clear reason: ? Sudden chest discomfort. ? Sudden discomfort in your arms, back, neck, or jaw.  You have shortness of breath at any time.  You suddenly start to sweat, or your skin gets clammy.  You feel sick to your stomach (nauseous).  You throw up (vomit).  You suddenly feel light-headed or dizzy.  Your heart starts to beat fast, or it feels like it is skipping beats. These symptoms may be an emergency. Do not wait to  see if the symptoms will go away. Get medical help right away. Call your local emergency services (911 in the [place] Do not drive yourself to the hospital. This information is not intended to replace advice given to you by your health care provider. Make sure you discuss any questions you have with your health care provider. Document Released: 09/27/2007 Document Revised: 01/03/2016 Document Reviewed: 01/03/2016 Elsevier Interactive Patient Education  2017 [employer].

## 2017-05-14 NOTE — Progress Notes (Signed)
Name: Suzanne [name]   MRN: [id number]    DOB: March 22, 1956   Date:05/14/2017       Progress Note  Subjective  Chief Complaint  Chief Complaint  Patient presents with  . Establish Care    CPE, fasting, wants A1c check    HPI  Patient presents for annual CPE.  Diet: Breakfast-2 cups of coffee, cereal; Lunch-packs lunch; occasionally eats out; Dinner-rotisserie and bread. Drinks-water; sometimes one soda a day Exercise:no routine exercise-does [facility] She has gained about 8 lbs slowly due to not watching her diet, she plans to focus on diet and exercise this year.  USPSTF grade A and B recommendations  Depression:  Dep[facility] screen [facility] 08/23/2015  Decreased Interest 0  Down, Depressed, Hopeless 0  PHQ - 2 Score 0   Hypertension: BP Readings from Last 3 Encounters:  05/14/17 124/80  08/10/16 122/66  03/02/16 (!) 148/74   Obesity: Wt Readings from Last 3 Encounters:  05/14/17 220 lb (99.8 kg)  08/10/16 212 lb (96.2 kg)  03/02/16 224 lb (101.6 kg)   BMI Readings from Last 3 Encounters:  05/14/17 38.97 kg/m  08/10/16 37.55 kg/m  03/02/16 39.68 kg/m    Alcohol: no Tobacco use: no HIV, hep B, hep C: not intestersted STD testing and prevention (chl/gon/syphilis): not interested Intimate partner violence: no concerns Sexual History/Pain during Intercourse: no Menstrual History/LMP/Abnormal Bleeding:no Incontinence Symptoms: denies   Vaccinations: UP TO DATE  Advanced Care Planning: A voluntary discussion about advance care planning including the explanation and discussion of advance directives.  Discussed health care proxy and Living will, and the patient was able to identify a health care proxy as husband is primary. son is secondary.  Patient does have a living will at present time. If patient does have living will, I have requested they bring this to the clinic to be scanned in to their chart.  Breast cancer: Mammogram ordered  today Cervical cancer screening: Follows with GYN for paps  Lipids:  Lab Results  Component Value Date   CHOL 149 08/23/2015   CHOL 149 09/12/2013   CHOL 131 07/13/2011   Lab Results  Component Value Date   HDL 46.30 08/23/2015   HDL 48.10 09/12/2013   HDL 43.70 07/13/2011   Lab Results  Component Value Date   LDLCALC 94 08/23/2015   LDLCALC 92 09/12/2013   LDLCALC 78 07/13/2011   Lab Results  Component Value Date   TRIG 45.0 08/23/2015   TRIG 46.0 09/12/2013   TRIG 47.0 07/13/2011   Lab Results  Component Value Date   CHOLHDL 3 08/23/2015   CHOLHDL 3 09/12/2013   CHOLHDL 3 07/13/2011   No results found for: LDLDIRECT  Glucose:  Glucose, Bld  Date Value Ref Range Status  11/03/2015 101 (H) 70 - 99 mg/dL Final  40/98/119105/04/2015 478103 (H) 70 - 99 mg/dL Final  29/56/213007/09/2014 92 70 - 99 mg/dL Final    Skin cancer: no concerns today Colorectal cancer: screening up to date  Aspirin: taking 81mg  daily ECG: Today   Patient Active Problem List   Diagnosis Date Noted  . Tinea pedis 08/10/2016  . Hematochezia 11/03/2015  . Abdominal pain 11/03/2015  . Routine general medical examination at a health care facility 08/23/2015  . Paresthesia 11/02/2014  . RLQ abdominal pain 10/28/2014  . Adult stuttering   . Anxiety 11/23/2010  . RESTLESS LEG SYNDROME 01/19/2010  . POSTMENOPAUSAL STATUS 10/08/2009  . OBESITY 07/21/2009  . 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  GERD 07/07/2009  . Essential hypertension 01/13/2009  . Diabetes mellitus type 2, diet-controlled (HCC) 01/13/2009    Past Surgical History:  Procedure Laterality Date  . CHOLECYSTECTOMY  2000  . COLONOSCOPY    . OTHER SURGICAL HISTORY  2000   ? if for diverticulosis, pt states they went through beely button    Family History  Problem Relation Age of Onset  . Lung cancer Father   . Allergies Brother   . Liver disease Brother   . Allergies Son   . Lung cancer Maternal Aunt   . Colon cancer Maternal Aunt   .  Clotting disorder Maternal Aunt   . Diabetes Maternal Aunt   . Pancreatic cancer Maternal Uncle   . Diabetes Paternal Aunt   . Heart disease Paternal Uncle     Social History   Socioeconomic History  . Marital status: Married    Spouse name: Not on file  . Number of children: 1  . Years of education: Not on file  . Highest education level: Not on file  Social Needs  . Financial resource strain: Not on file  . Food insecurity - worry: Not on file  . Food insecurity - inability: Not on file  . Transportation needs - medical: Not on file  . Transportation needs - non-medical: Not on file  Occupational History  . 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 Smoking status: Never Smoker  . Smokeless tobacco: Never Used  Substance and Sexual Activity  . Alcohol use: No  . Drug use: No  . Sexual activity: Yes    Partners: Male    Birth control/protection: Post-menopausal    Comment: [place]. Has worked before in [employer]. Data processing manager for [employer]. Lives with her husband and 16y son,and dog  Other Topics Concern  . Not on file  Social History Narrative   Out of work 06/2011      Current Outpatient Medications:  .  metoprolol succinate (TOPROL-XL) 100 MG 24 hr tablet, TAKE ONE TABLET BY MOUTH ONCE DAILY WITH OR IMMEDIATELY FOLLOWING A MEAL, Disp: 90 tablet, Rfl: 0 .  venlafaxine XR (EFFEXOR-XR) 150 MG 24 hr capsule, TAKE ONE CAPSULE BY MOUTH ONCE DAILY WITH BREAKFAST, Disp: 90 capsule, Rfl: 0  Current Facility-Administered Medications:  .  0.9 %  sodium chloride infusion, 500 mL, Intravenous, Continuous, Meryl Dare, MD  Allergies  Allergen Reactions  . Other Swelling    ALL SEAFOOD  . Shrimp [Shellfish Allergy] Swelling    Pt states she allergic to seafood     ROS  Constitutional: Negative for fever or weight change.  Respiratory: Negative for cough and shortness of breath.   Cardiovascular: Negative for palpitations.  Positive for chest pain. Gastrointestinal: Negative for abdominal pain, no bowel changes.  Musculoskeletal: Negative for gait problem or joint swelling.  Skin: Negative for rash.  Neurological: Negative for dizziness. Positive for headache. No other specific complaints in a complete review of systems (except as listed in HPI above).  Chest pain- She noticed the pain once last week. The pain was on the left side of her chest lasting for a few seconds. The pain was a sharp pain. The pain occurred while she was anxious during a stressful event then resolved without any treatment. She denies diaphoresis, lightheadedness, syncope, shortness of breath.  Headache- She reports occasional mild headaches. The headaches resolve either with time or bc powder.  Objective  Vitals:   05/14/17 6578  BP: 124/80  Pulse: 72  Resp: 16  Temp: 98.4 F (36.9 C)  TempSrc: Oral  SpO2: 98%  Weight: 220 lb (99.8 kg)  Height: 5\' 3"  (1.6 m)    Body mass index is 38.97 kg/m.  Physical Exam Vital signs reviewed. Constitutional: Patient appears well-developed and well-nourished. No distress.  HENT: Head: Normocephalic and atraumatic. Ears: B TMs cloudy, no erythema or effusion; Nose: Nose normal. Mouth/Throat: Oropharynx is clear and moist. No oropharyngeal exudate.  Eyes: Conjunctivae and EOM are normal. Pupils are equal, round, and reactive to light. No scleral icterus.  Neck: Normal range of motion. Neck supple. No thyromegaly present. No cervical adenopathy. Cardiovascular: Normal rate, regular rhythm and normal heart sounds.  No murmur heard. No BLE edema. Pulmonary/Chest: Effort normal and breath sounds normal. No respiratory distress. Abdominal: Soft. Bowel sounds are normal, no distension. There is no tenderness. Breast: deferred to GYN FEMALE GENITALIA:  Deferred to GYN Musculoskeletal: Normal range of motion, no joint effusions. No gross deformities Neurological: She is alert and oriented to  person, place, and time. No cranial nerve deficit. Coordination, balance, strength, speech and gait are normal.  Skin: Skin is warm and dry. No rash noted. No erythema.  Psychiatric: Patient has a normal mood and affect. behavior is normal. Judgment and thought content normal.  Diabetic Foot Exam: Diabetic Foot Exam - Simple   Simple Foot Form Visual Inspection No deformities, no ulcerations, no other skin breakdown bilaterally:  Yes Sensation Testing Intact to touch and monofilament testing bilaterally:  Yes Pulse Check Posterior Tibialis and Dorsalis pulse intact bilaterally:  Yes Comments     PHQ2/9: Depression screen PHQ 2/9 08/23/2015  Decreased Interest 0  Down, Depressed, Hopeless 0  PHQ - 2 Score 0   Fall Risk: Fall Risk  08/23/2015  Falls in the past year? No    Assessment & Plan RTC in 6 months for routine follow up. She plans to follow with [facility] for annual PAP and womens exam.   Chest pain, unspecified type I personally reviewed the patients EKG today: sinus rhythm without acute abnormalities. I suspect her pain could be related to stressful event. Strict return precautions including when to call 911 discussed and printed on discharge paperwork. Pt education handout given. - EKG 12-Lead - CBC with Differential/Platelet; Future - Comprehensive metabolic panel; Future - TSH; Future

## 2017-05-14 NOTE — Assessment & Plan Note (Signed)
-  USPSTF grade A and B recommendations reviewed with patient; age-appropriate recommendations, preventive care, screening tests, etc discussed and encouraged; healthy living encouraged; see AVS for patient education given to patient -Discussed importance of 150 minutes of physical activity weekly, eat two servings of fish weekly, eat one serving of tree nuts ( cashews, pistachios, pecans, almonds.Marland Kitchen.) every other day, eat 6 servings of fruit/vegetables daily and drink plenty of water and avoid sweet beverages.  - MM DIGITAL SCREENING BILATERAL; Future-Screening for breast cancer - HM Diabetes Foot Exam - Lipid panel; Future-Screening for cholesterol level - Urine Microalbumin w/creat. ratio; Future -Reviewed Health Maintenance: She plans to follow with [facility] for annual PAP and womens exam. Recommended annual eye exam, she will call her own ophthalmologist.

## 2017-05-14 NOTE — Assessment & Plan Note (Signed)
Stable, continue metoprolol. - CBC with Differential/Platelet; Future - Comprehensive metabolic panel; Future - TSH; Future - Lipid panel; Future

## 2017-05-14 NOTE — Assessment & Plan Note (Signed)
Prediabetes on past a1c Lab Results  Component Value Date   HGBA1C 6.4 08/23/2015  We discussed healthy diet and exercise in prevention of diabetes. - Comprehensive metabolic panel; Future - TSH; Future - HM Diabetes Foot Exam - Hemoglobin A1c; Future - Lipid panel; Future - Urine Microalbumin w/creat. ratio; Future Recommended annual eye exam, she will call her own ophthalmologist.

## 2017-06-07 ENCOUNTER — Other Ambulatory Visit: Payer: Self-pay | Admitting: Nurse Practitioner

## 2017-06-07 DIAGNOSIS — Z1231 Encounter for screening mammogram for malignant neoplasm of breast: Secondary | ICD-10-CM

## 2017-06-11 ENCOUNTER — Other Ambulatory Visit: Payer: Self-pay | Admitting: Nurse Practitioner

## 2017-06-11 DIAGNOSIS — I10 Essential (primary) hypertension: Secondary | ICD-10-CM

## 2017-06-15 ENCOUNTER — Telehealth: Payer: Self-pay | Admitting: Nurse Practitioner

## 2017-06-15 ENCOUNTER — Other Ambulatory Visit: Payer: Self-pay | Admitting: Nurse Practitioner

## 2017-06-15 DIAGNOSIS — I10 Essential (primary) hypertension: Secondary | ICD-10-CM

## 2017-06-15 MED ORDER — METOPROLOL SUCCINATE ER 100 MG PO TB24: ORAL_TABLET | ORAL | 1 refills | Status: DC

## 2017-06-15 MED ORDER — VENLAFAXINE HCL ER 150 MG PO CP24: ORAL_CAPSULE | ORAL | 1 refills | Status: DC

## 2017-06-15 NOTE — Telephone Encounter (Signed)
Copied from CRM [phone]#[id number]. Topic: Quick Communication - Rx Refill/Question >> Jun 15, 2017  9:55 AM Floria RavelingStovall, Shana A wrote: Medication: metoprolol succinate (TOPROL-XL) 100 MG 24 hr tablet [[id number]][[id number]] and  venlafaxine XR (EFFEXOR-XR) 150 MG 24 hr capsule [[id number]][[id number]]   Has the patient contacted their pharmacy? no   (Agent: If no, request that the patient contact the pharmacy for the refill.)   Preferred Pharmacy (with phone number or street name): [facility] [address] / pt was last seen 6/211/21 but Catarina Hartshornashley Shambley - Pt has been without it for 5 days    Agent: Please be advised that RX refills may take up to 3 business days. We ask that you follow-up with your pharmacy.

## 2017-06-29 ENCOUNTER — Ambulatory Visit
Admission: RE | Admit: 2017-06-29 | Discharge: 2017-06-29 | Disposition: A | Discharge status: Home / Self Care | Payer: BC Managed Care – PPO | Source: Ambulatory Visit | Attending: Nurse Practitioner | Admitting: Nurse Practitioner

## 2017-06-29 DIAGNOSIS — Z1231 Encounter for screening mammogram for malignant neoplasm of breast: Secondary | ICD-10-CM

## 2017-07-04 ENCOUNTER — Telehealth: Payer: Self-pay | Admitting: Nurse Practitioner

## 2017-07-04 NOTE — Telephone Encounter (Signed)
Copied from CRM [id number]. Topic: Quick Communication - Lab Results >> Jul 04, 2017  9:00 AM Cecelia ByarsGreen, Cordon Gassett L, RMA wrote: Please call pt with lab results

## 2017-07-04 NOTE — Telephone Encounter (Signed)
Mammogram results given to Pt, she expressed understanding.

## 2017-12-07 ENCOUNTER — Ambulatory Visit: Payer: BC Managed Care – PPO | Admitting: Nurse Practitioner

## 2017-12-07 ENCOUNTER — Encounter: Payer: Self-pay | Admitting: Nurse Practitioner

## 2017-12-07 VITALS — BP 136/74 | HR 69 | Temp 98.0°F | Resp 16 | Ht 63.0 in | Wt 222.8 lb

## 2017-12-07 DIAGNOSIS — L732 Hidradenitis suppurativa: Secondary | ICD-10-CM

## 2017-12-07 MED ORDER — SULFAMETHOXAZOLE-TRIMETHOPRIM 800-160 MG PO TABS: 1.0000 | ORAL_TABLET | Freq: Two times a day (BID) | ORAL | 0 refills | Status: DC

## 2017-12-07 NOTE — Progress Notes (Signed)
Name: Beckie BusingCallie O Car   MRN: [id number]    DOB: 1955-07-04   Date:12/07/2017       Progress Note  Subjective  Chief Complaint  Chief Complaint  Patient presents with  . Cyst    noticed knots in arm pit area since monday, states she has some pain with knot as well, has increased in size since she noticed them    HPI  Ms Sharman CheekDegraffinreaidt is here today for an acute complaint of painful bumps to bilateral axilla, first noticed several days ago, have not improved since onset There is no drainage. She denies new soaps or products, does wear deodorant and regularly shaves her armpits. She denies fevers, chills, weakness, malaise. Overall feels well. She has been applying warm compresses and taking aleve with temporary pain relief. She has not noticed similar bumps on her body in the past. She is not a smoker.  Patient Active Problem List   Diagnosis Date Noted  . Tinea pedis 08/10/2016  . Hematochezia 11/03/2015  . Abdominal pain 11/03/2015  . Encounter for general adult medical examination with abnormal findings 08/23/2015  . Paresthesia 11/02/2014  . RLQ abdominal pain 10/28/2014  . Adult stuttering   . Anxiety 11/23/2010  . RESTLESS LEG SYNDROME 01/19/2010  . POSTMENOPAUSAL STATUS 10/08/2009  . OBESITY 07/21/2009  . 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 GERD 07/07/2009  . Essential hypertension 01/13/2009  . Diabetes mellitus type 2, diet-controlled (HCC) 01/13/2009    Social History   Tobacco Use  . Smoking status: Never Smoker  . Smokeless tobacco: Never Used  Substance Use Topics  . Alcohol use: No     Current Outpatient Medications:  .  metoprolol succinate (TOPROL-XL) 100 MG 24 hr tablet, TAKE ONE TABLET BY MOUTH ONCE DAILY WITH OR IMMEDIATELY FOLLOWING A MEAL, Disp: 90 tablet, Rfl: 1 .  venlafaxine XR (EFFEXOR-XR) 150 MG 24 hr capsule, TAKE ONE CAPSULE BY MOUTH ONCE DAILY WITH BREAKFAST, Disp: 90 capsule, Rfl: 1  Current Facility-Administered Medications:  .   0.9 %  sodium chloride infusion, 500 mL, Intravenous, Continuous, Meryl DareStark, Malcolm T, MD  Allergies  Allergen Reactions  . Other Swelling    ALL SEAFOOD  . Shrimp [Shellfish Allergy] Swelling    Pt states she allergic to seafood    ROS  No other specific complaints in a complete review of systems (except as listed in HPI above).  Objective  Vitals:   12/07/17 1358  BP: 136/74  Pulse: 69  Resp: 16  Temp: 98 F (36.7 C)  TempSrc: Oral  SpO2: 98%  Weight: 222 lb 12.8 oz (101.1 kg)  Height: 5\' 3"  (1.6 m)    Body mass index is 39.47 kg/m.  Nursing Note and Vital Signs reviewed.  Physical Exam  Constitutional: Patient appears well-developed and well-nourished. Obese  No distress.  HEENT: head atraumatic, normocephalic, pupils equal and reactive to light, EOM's intact, neck supple without lymphadenopathy, oropharynx pink and moist without exudate Cardiovascular: Normal rate, regular rhythm, distal pulses intact. Pulmonary/Chest: Effort normal and breath sounds normal. No respiratory distress. Neurological: She is alert and oriented to person, place, and time. No cranial nerve deficit. Coordination, balance, strength, speech and gait are normal.  Skin: Skin is warm and dry. Multiple inflamed nodules to bilateral axilla, easily moveable, mildly erythematous and tender to touch.  Psychiatric: Patient has a normal mood and affect. behavior is normal. Judgment and thought content normal.   Assessment & Plan RTC in 3-4 weeks for F/U: hidradenitis suppurativa- starting bactrim course  1. Hidradenitis suppurativa PE suspicious for  hidradenitis suppurativa although she denies past history Will initiate 3 week course of bactrim today and have her follow up in 3 weeks to determine further plan of care -home management, red flags and when to present for emergency care or RTC including fever >101.45F, new/worsening/un-resolving symptoms,  reviewed with patient at time of visit. Follow  up and care instructions discussed and provided in AVS. - sulfamethoxazole-trimethoprim (BACTRIM DS,SEPTRA DS) 800-160 MG tablet; Take 1 tablet by mouth 2 (two) times daily.  Dispense: 21 tablet; Refill: 0

## 2017-12-07 NOTE — Patient Instructions (Signed)
Please start bactrim 1 tablet twice daily for 3 weeks.  Please return in about 3-4 weeks for follow up so I can see how you are doing and determine if any further testing or treatment is needed.  Hidradenitis Suppurativa Hidradenitis suppurativa is a long-term (chronic) skin disease that starts with blocked sweat glands or hair follicles. Bacteria may grow in these blocked openings of your skin. Hidradenitis suppurativa is like a severe form of acne that develops in areas of your body where acne would be unusual. It is most likely to affect the areas of your body where skin rubs against skin and becomes moist. This includes your:  Underarms.  Groin.  Genital areas.  Buttocks.  Upper thighs.  Breasts.  Hidradenitis suppurativa may start out with small pimples. The pimples can develop into deep sores that break open (rupture) and drain pus. Over time your skin may thicken and become scarred. Hidradenitis suppurativa cannot be passed from person to person. What are the causes? The exact cause of hidradenitis suppurativa is not known. This condition may be due to:  Female and female hormones. The condition is rare before and after puberty.  An overactive body defense system (immune system). Your immune system may overreact to the blocked hair follicles or sweat glands and cause swelling and pus-filled sores.  What increases the risk? You may have a higher risk of hidradenitis suppurativa if you:  Are a woman.  Are between ages 11011 and 4155.  Have a family history of hidradenitis suppurativa.  Have a personal history of acne.  Are overweight.  Smoke.  Take the drug lithium.  What are the signs or symptoms? The first signs of an outbreak are usually painful skin bumps that look like pimples. As the condition progresses:  Skin bumps may get bigger and grow deeper into the skin.  Bumps under the skin may rupture and drain smelly pus.  Skin may become itchy and infected.  Skin  may thicken and scar.  Drainage may continue through tunnels under the skin (fistulas).  Walking and moving your arms can become painful.  How is this diagnosed? Your health care provider may diagnose hidradenitis suppurativa based on your medical history and your signs and symptoms. A physical exam will also be done. You may need to see a health care provider who specializes in skin diseases (dermatologist). You may also have tests done to confirm the diagnosis. These can include:  Swabbing a sample of pus or drainage from your skin so it can be sent to the lab and tested for infection.  Blood tests to check for infection.  How is this treated? The same treatment will not work for everybody with hidradenitis suppurativa. Your treatment will depend on how severe your symptoms are. You may need to try several treatments to find what works best for you. Part of your treatment may include cleaning and bandaging (dressing) your wounds. You may also have to take medicines, such as the following:  Antibiotics.  Acne medicines.  Medicines to block or suppress the immune system.  A diabetes medicine (metformin) is sometimes used to treat this condition.  For women, birth control pills can sometimes help relieve symptoms.  You may need surgery if you have a severe case of hidradenitis suppurativa that does not respond to medicine. Surgery may involve:  Using a laser to clear the skin and remove hair follicles.  Opening and draining deep sores.  Removing the areas of skin that are diseased and scarred.  Follow these instructions at home:  Learn as much as you can about your disease, and work closely with your health care providers.  Take medicines only as directed by your health care provider.  If you were prescribed an antibiotic medicine, finish it all even if you start to feel better.  If you are overweight, losing weight may be very helpful. Try to reach and maintain a healthy  weight.  Do not use any tobacco products, including cigarettes, chewing tobacco, or electronic cigarettes. If you need help quitting, ask your health care provider.  Do not shave the areas where you get hidradenitis suppurativa.  Do not wear deodorant.  Wear loose-fitting clothes.  Try not to overheat and get sweaty.  Take a daily bleach bath as directed by your health care provider. ? Fill your bathtub halfway with water. ? Pour in  cup of unscented household bleach. ? Soak for 5-10 minutes.  Cover sore areas with a warm, clean washcloth (compress) for 5-10 minutes. Contact a health care provider if:  You have a flare-up of hidradenitis suppurativa.  You have chills or a fever.  You are having trouble controlling your symptoms at home. This information is not intended to replace advice given to you by your health care provider. Make sure you discuss any questions you have with your health care provider. Document Released: 11/23/2003 Document Revised: 09/16/2015 Document Reviewed: 07/11/2013 Elsevier Interactive Patient Education  2018 [employer]

## 2017-12-20 ENCOUNTER — Other Ambulatory Visit: Payer: Self-pay | Admitting: Nurse Practitioner

## 2017-12-20 DIAGNOSIS — I10 Essential (primary) hypertension: Secondary | ICD-10-CM

## 2018-04-01 ENCOUNTER — Ambulatory Visit: Payer: BC Managed Care – PPO | Admitting: Internal Medicine

## 2018-04-01 ENCOUNTER — Encounter: Payer: Self-pay | Admitting: Internal Medicine

## 2018-04-01 VITALS — BP 128/78 | HR 68 | Temp 98.2°F | Resp 16 | Ht 63.0 in | Wt 225.0 lb

## 2018-04-01 DIAGNOSIS — L0293 Carbuncle, unspecified: Secondary | ICD-10-CM | POA: Insufficient documentation

## 2018-04-01 DIAGNOSIS — M79601 Pain in right arm: Secondary | ICD-10-CM | POA: Insufficient documentation

## 2018-04-01 NOTE — Progress Notes (Signed)
Subjective:    Patient ID: Suzanne [name], female    DOB: November 23, 1955, 62 y.o.   MRN: [id number]  HPI The patient is here for an acute visit.   Her right arm aches - it aches intermittently.  It aches from her upper arm to her proximal lower arm.  It is not related to to any position or movement.  She denies numbness/tinlging.  If it is hurting and she moves the arm/shoulder it does hurt more.  She denies any injuries.  It started hurting after she was pulling something heavy, but the pain did not start immediately after that.  She denies weakness in her hand.  The arm is tender to touch in certain locations.  Boils:  She has a history of boils and in August she was on an antibiotic.  They improved after that, but she thinks she starting to get them back in her left groin and left axilla.  She denies any significant pain, but has some mild discomfort in these areas.  She denies fevers and chills.   Medications and allergies reviewed with patient and updated if appropriate.  Patient Active Problem List   Diagnosis Date Noted  . Tinea pedis 08/10/2016  . Hematochezia 11/03/2015  . Abdominal pain 11/03/2015  . Encounter for general adult medical examination with abnormal findings 08/23/2015  . Paresthesia 11/02/2014  . RLQ abdominal pain 10/28/2014  . Adult stuttering   . Anxiety 11/23/2010  . RESTLESS LEG SYNDROME 01/19/2010  . POSTMENOPAUSAL STATUS 10/08/2009  . OBESITY 07/21/2009  . 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 GERD 07/07/2009  . Essential hypertension 01/13/2009  . Diabetes mellitus type 2, diet-controlled (HCC) 01/13/2009    Current Outpatient Medications on File Prior to Visit  Medication Sig Dispense Refill  . metoprolol succinate (TOPROL-XL) 100 MG 24 hr tablet TAKE 1 TABLET BY MOUTH ONCE DAILY WITH OR IMMEDIATELY FOLLOWING A MEAL 90 tablet 1  . sulfamethoxazole-trimethoprim (BACTRIM DS,SEPTRA DS) 800-160 MG tablet Take 1 tablet by mouth 2 (two) times daily.  21 tablet 0  . venlafaxine XR (EFFEXOR-XR) 150 MG 24 hr capsule TAKE ONE CAPSULE BY MOUTH ONCE DAILY WITH BREAKFAST 90 capsule 1   Current Facility-Administered Medications on File Prior to Visit  Medication Dose Route Frequency Provider Last Rate Last Dose  . 0.9 %  sodium chloride infusion  500 mL Intravenous Continuous Meryl DareStark, Malcolm T, MD        Past Medical History:  Diagnosis Date  . Anxiety   . Chronic headaches   . Depression   . Diabetes mellitus, type 2 (HCC)   . Diverticulosis   . Gallstones   . GERD   . Hepatitis B   . HYPERTENSION   . OBESITY   . RAYNAUDS SYNDROME   . Seizures [facility](HCC)    age 62 only one time  . Stammering     Past Surgical History:  Procedure Laterality Date  . CHOLECYSTECTOMY  2000  . COLONOSCOPY    . OTHER SURGICAL HISTORY  2000   ? if for diverticulosis, pt states they went through beely button    Social History   Socioeconomic History  . Marital status: Married    Spouse name: Not on file  . Number of children: 1  . Years of education: Not on file  . Highest education level: Not on file  Occupational History  . 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 Financial resource strain: Not on file  . Food insecurity:  Worry: Not on file    Inability: Not on file  . Transportation needs:    Medical: Not on file    Non-medical: Not on file  Tobacco Use  . Smoking status: Never Smoker  . Smokeless tobacco: Never Used  Substance and Sexual Activity  . Alcohol use: No  . Drug use: No  . Sexual activity: Yes    Partners: Male    Birth control/protection: Post-menopausal    Comment: [place]. Has worked before in [employer]. Data processing manager for [employer]. Lives with her husband and 16y son,and dog  Lifestyle  . Physical activity:    Days per week: Not on file    Minutes per session: Not on file  . Stress: Not on file  Relationships  . Social connections:    Talks on phone: Not on file     Gets together: Not on file    Attends religious service: Not on file    Active member of club or organization: Not on file    Attends meetings of clubs or organizations: Not on file    Relationship status: Not on file  Other Topics Concern  . Not on file  Social History Narrative   Out of work 06/2011     Family History  Problem Relation Age of Onset  . Lung cancer Father   . Allergies Brother   . Liver disease Brother   . Allergies Son   . Lung cancer Maternal Aunt   . Colon cancer Maternal Aunt   . Clotting disorder Maternal Aunt   . Diabetes Maternal Aunt   . Pancreatic cancer Maternal Uncle   . Diabetes Paternal Aunt   . Heart disease Paternal Uncle     Review of Systems  Constitutional: Negative for chills and fever.  Musculoskeletal: Positive for arthralgias and neck pain (mild right side). Negative for joint swelling.  Skin: Negative for color change and wound.  Neurological: Negative for weakness and numbness.       Objective:   Vitals:   04/01/18 1054  BP: 128/78  Pulse: 68  Resp: 16  Temp: 98.2 F (36.8 C)  SpO2: 98%   BP Readings from Last 3 Encounters:  04/01/18 128/78  12/07/17 136/74  05/14/17 124/80   Wt Readings from Last 3 Encounters:  04/01/18 225 lb (102.1 kg)  12/07/17 222 lb 12.8 oz (101.1 kg)  05/14/17 220 lb (99.8 kg)   Body mass index is 39.86 kg/m.   Physical Exam  Constitutional: She appears well-developed and well-nourished. No distress.  HENT:  Head: Normocephalic and atraumatic.  Musculoskeletal:  Right shoulder and elbow with normal range of motion, no joint deformity or swelling.  Minimal tenderness with palpation upper arm and proximal lower arm.  Slight increase in pain with pronating and supinating arm  Neurological:  Normal sensation and strength bilateral upper extremities  Skin: Skin is warm and dry. She is not diaphoretic.  No erythema, swelling or wound in left groin.  He does have a couple of palpable nontender  lymph nodes in the left groin. Left axilla palpable cyst or lymph node-nontender without erythema-no open wound or discharge.  No other palpable cysts or boils           Assessment & Plan:    See Problem List for Assessment and Plan of chronic medical problems.

## 2018-04-01 NOTE — Assessment & Plan Note (Signed)
She has a history of recurrent boils She was on antibiotic in August and that cleared up infected boil she had in her groin and axilla She is having some mild tenderness in her groin, but on exam there is no evidence of an infected cyst or groin She has a can feel a lump in her left axilla, but it is nontender and does not need antibiotic treatment at this time Recommended warm compresses and monitor closely

## 2018-04-01 NOTE — Patient Instructions (Addendum)
Continue to monitor the boils.  You do not need antibiotics at this time.     Try to rest your right arm, you can take advil or aleve for the pain.  You can try ice and heat.   If there is no improvement call to schedule an appointment with sports medicine.

## 2018-04-01 NOTE — Assessment & Plan Note (Signed)
Possible tendinitis Discussed conservative treatment with rest, ice/heat, Aleve or ibuprofen Avoid activities that cause the pain If her symptoms do not improve recommended that she make an appointment with sports medicine for further evaluation and treatment

## 2018-05-15 ENCOUNTER — Ambulatory Visit (INDEPENDENT_AMBULATORY_CARE_PROVIDER_SITE_OTHER): Payer: BC Managed Care – PPO | Admitting: Nurse Practitioner

## 2018-05-15 ENCOUNTER — Other Ambulatory Visit (INDEPENDENT_AMBULATORY_CARE_PROVIDER_SITE_OTHER): Payer: BC Managed Care – PPO

## 2018-05-15 ENCOUNTER — Encounter: Payer: Self-pay | Admitting: Nurse Practitioner

## 2018-05-15 VITALS — BP 130/90 | HR 86 | Temp 97.6°F | Ht 63.0 in | Wt 228.0 lb

## 2018-05-15 DIAGNOSIS — E119 Type 2 diabetes mellitus without complications: Secondary | ICD-10-CM | POA: Diagnosis not present

## 2018-05-15 DIAGNOSIS — F419 Anxiety disorder, unspecified: Secondary | ICD-10-CM

## 2018-05-15 DIAGNOSIS — Z0001 Encounter for general adult medical examination with abnormal findings: Secondary | ICD-10-CM

## 2018-05-15 DIAGNOSIS — L732 Hidradenitis suppurativa: Secondary | ICD-10-CM | POA: Insufficient documentation

## 2018-05-15 DIAGNOSIS — Z0000 Encounter for general adult medical examination without abnormal findings: Secondary | ICD-10-CM | POA: Diagnosis not present

## 2018-05-15 DIAGNOSIS — I10 Essential (primary) hypertension: Secondary | ICD-10-CM

## 2018-05-15 DIAGNOSIS — Z13220 Encounter for screening for lipoid disorders: Secondary | ICD-10-CM | POA: Diagnosis not present

## 2018-05-15 DIAGNOSIS — Z114 Encounter for screening for human immunodeficiency virus [HIV]: Secondary | ICD-10-CM

## 2018-05-15 LAB — LIPID PANEL
Cholesterol: 154 mg/dL (ref 0–200)
HDL: 39.1 mg/dL (ref >39.00)
LDL Cholesterol: 101 mg/dL — ABNORMAL HIGH (ref 0–99)
NonHDL: 115.01
Total CHOL/HDL Ratio: 4
Triglycerides: 71 mg/dL (ref 0.0–149.0)
VLDL: 14.2 mg/dL (ref 0.0–40.0)

## 2018-05-15 LAB — COMPREHENSIVE METABOLIC PANEL
ALT: 8 U/L (ref 0–35)
AST: 13 U/L (ref 0–37)
Albumin: 3.9 g/dL (ref 3.5–5.2)
Alkaline Phosphatase: 95 U/L (ref 39–117)
BUN: 14 mg/dL (ref 6–23)
CO2: 30 mEq/L (ref 19–32)
Calcium: 9.4 mg/dL (ref 8.4–10.5)
Chloride: 102 mEq/L (ref 96–112)
Creatinine, Ser: 0.79 mg/dL (ref 0.40–1.20)
GFR: 88.51 mL/min (ref >60.00)
Glucose, Bld: 102 mg/dL — ABNORMAL HIGH (ref 70–99)
Potassium: 4.5 mEq/L (ref 3.5–5.1)
Sodium: 139 mEq/L (ref 135–145)
Total Bilirubin: 0.3 mg/dL (ref 0.2–1.2)
Total Protein: 7.8 g/dL (ref 6.0–8.3)

## 2018-05-15 LAB — CBC
HCT: 37.6 % (ref 36.0–46.0)
Hemoglobin: 12.4 g/dL (ref 12.0–15.0)
MCHC: 32.9 g/dL (ref 30.0–36.0)
MCV: 87.3 fl (ref 78.0–100.0)
Platelets: 336 10*3/uL (ref 150.0–400.0)
RBC: 4.31 Mil/uL (ref 3.87–5.11)
RDW: 14.5 % (ref 11.5–15.5)
WBC: 5.2 10*3/uL (ref 4.0–10.5)

## 2018-05-15 LAB — HEMOGLOBIN A1C: Hgb A1c MFr Bld: 6.3 % (ref 4.6–6.5)

## 2018-05-15 MED ORDER — METFORMIN HCL 500 MG PO TABS: 500.0000 mg | ORAL_TABLET | Freq: Every day | ORAL | 3 refills | Status: DC

## 2018-05-15 MED ORDER — VENLAFAXINE HCL ER 150 MG PO CP24: ORAL_CAPSULE | ORAL | 3 refills | Status: DC

## 2018-05-15 MED ORDER — DOXYCYCLINE HYCLATE 100 MG PO TABS: 100.0000 mg | ORAL_TABLET | Freq: Two times a day (BID) | ORAL | 2 refills | Status: DC

## 2018-05-15 MED ORDER — HIBICLENS HAND PUMP 32OZ MISC: 1.0000 | Freq: Every day | 3 refills | Status: DC | PRN

## 2018-05-15 NOTE — Assessment & Plan Note (Signed)
Stable Continue current medication Update labs Continue to monitor home readings, f/u for readings >140/90 - CBC; Future - Comprehensive metabolic panel; Future - Lipid panel; Future

## 2018-05-15 NOTE — Assessment & Plan Note (Signed)
Stable Continue current medications  F/U for new, worsening symptoms - venlafaxine XR (EFFEXOR-XR) 150 MG 24 hr capsule; TAKE ONE CAPSULE BY MOUTH ONCE DAILY WITH BREAKFAST  Dispense: 90 capsule; Refill: 3 - CBC; Future - Comprehensive metabolic panel; Future

## 2018-05-15 NOTE — Assessment & Plan Note (Signed)
Reviewed annual screening exams, healthy lifestyle, weight loss, additional information provided on AVS Will request ophthalmology records for eye exam - CBC; Future - Comprehensive metabolic panel; Future - Lipid panel; Future - Hemoglobin A1c; Future - HIV Antibody (routine testing w rflx); Future - Ambulatory referral to Gynecology  Screening for cholesterol level she is fasting - Lipid panel; Future  Screening for HIV (human immunodeficiency virus)- HIV Antibody (routine testing w rflx); Future

## 2018-05-15 NOTE — Patient Instructions (Addendum)
Head downstairs for labs  I have placed referral to dermatology for your boils, gynecology for womens care  Start hibiclens, doxycycline, metformin as prescribed  I will see you back in about 3 months for follow up, or sooner If needed   Hidradenitis Suppurativa Hidradenitis suppurativa is a long-term (chronic) skin disease. It is similar to a severe form of acne, but it affects areas of the body where acne would be unusual, especially areas of the body where skin rubs against skin and becomes moist. These include:  Underarms.  Groin.  Genital area.  Buttocks.  Upper thighs.  Breasts. Hidradenitis suppurativa may start out as small lumps or pimples caused by blocked sweat glands or hair follicles. Pimples may develop into deep sores that break open (rupture) and drain pus. Over time, affected areas of skin may thicken and become scarred. This condition is rare and does not spread from person to person (non-contagious). What are the causes? The exact cause of this condition is not known. It may be related to:  Female and female hormones.  An overactive disease-fighting system (immune system). The immune system may over-react to blocked hair follicles or sweat glands and cause swelling and pus-filled sores. What increases the risk? You are more likely to develop this condition if you:  Are female.  Are 36-23 years old.  Have a family history of hidradenitis suppurativa.  Have a personal history of acne.  Are overweight.  Smoke.  Take the medicine lithium. What are the signs or symptoms? The first symptoms are usually painful bumps in the skin, similar to pimples. The condition may get worse over time (progress), or it may only cause mild symptoms. If the disease progresses, symptoms may include:  Skin bumps getting bigger and growing deeper into the skin.  Bumps rupturing and draining pus.  Itchy, infected skin.  Skin getting thicker and scarred.  Tunnels under  the skin (fistulas) where pus drains from a bump.  Pain during daily activities, such as pain during walking if your groin area is affected.  Emotional problems, such as stress or depression. This condition may affect your appearance and your ability or willingness to wear certain clothes or do certain activities. How is this diagnosed? This condition is diagnosed by a health care provider who specializes in skin diseases (dermatologist). You may be diagnosed based on:  Your symptoms and medical history.  A physical exam.  Testing a pus sample for infection.  Blood tests. How is this treated? Your treatment will depend on how severe your symptoms are. The same treatment will not work for everybody with this condition. You may need to try several treatments to find what works best for you. Treatment may include:  Cleaning and bandaging (dressing) your wounds as needed.  Lifestyle changes, such as new skin care routines.  Taking medicines, such as: ? Antibiotics. ? Acne medicines. ? Medicines to reduce the activity of the immune system. ? A diabetes medicine (metformin). ? Birth control pills, for women. ? Steroids to reduce swelling and pain.  Working with a mental health care provider, if you experience emotional distress due to this condition. If you have severe symptoms that do not get better with medicine, you may need surgery. Surgery may involve:  Using a laser to clear the skin and remove hair follicles.  Opening and draining deep sores.  Removing the areas of skin that are diseased and scarred. Follow these instructions at home: Medicines   Take over-the-counter and prescription medicines only  as told by your health care provider.  If you were prescribed an antibiotic medicine, take it as told by your health care provider. Do not stop taking the antibiotic even if your condition improves. Skin care  If you have open wounds, cover them with a clean dressing as  told by your health care provider. Keep wounds clean by washing them gently with soap and water when you bathe.  Do not shave the areas where you get hidradenitis suppurativa.  Do not wear deodorant.  Wear loose-fitting clothes.  Try to avoid getting overheated or sweaty. If you get sweaty or wet, change into clean, dry clothes as soon as you can.  To help relieve pain and itchiness, cover sore areas with a warm, clean washcloth (warm compress) for 5-10 minutes as often as needed.  If told by your health care provider, take a bleach bath twice a week: ? Fill your bathtub halfway with water. ? Pour in  cup of unscented household bleach. ? Soak in the tub for 5-10 minutes. ? Only soak from the neck down. Avoid water on your face and hair. ? Shower to rinse off the bleach from your skin. General instructions  Learn as much as you can about your disease so that you have an active role in your treatment. Work closely with your health care provider to find treatments that work for you.  If you are overweight, work with your health care provider to lose weight as recommended.  Do not use any products that contain nicotine or tobacco, such as cigarettes and e-cigarettes. If you need help quitting, ask your health care provider.  If you struggle with living with this condition, talk with your health care provider or work with a mental health care provider as recommended.  Keep all follow-up visits as told by your health care provider. This is important. Where to find more information  Hidradenitis [place].: [email or web address]/ Contact a health care provider if you have:  A flare-up of hidradenitis suppurativa.  A fever or chills.  Trouble controlling your symptoms at home.  Trouble doing your daily activities because of your symptoms.  Trouble dealing with emotional problems related to your condition. Summary  Hidradenitis suppurativa is a long-term  (chronic) skin disease. It is similar to a severe form of acne, but it affects areas of the body where acne would be unusual.  The first symptoms are usually painful bumps in the skin, similar to pimples. The condition may get worse over time (progress), or it may only cause mild symptoms.  If you have open wounds, cover them with a clean dressing as told by your health care provider. Keep wounds clean by washing them gently with soap and water when you bathe.  Besides skin care, treatment may include medicines, laser treatment, and surgery. This information is not intended to replace advice given to you by your health care provider. Make sure you discuss any questions you have with your health care provider. Document Released: 11/23/2003 Document Revised: 04/18/2017 Document Reviewed: 04/18/2017 Elsevier Interactive Patient Education  2019 [employer] Maintenance, Female Adopting a healthy lifestyle and getting preventive care can go a long way to promote health and wellness. Talk with your health care provider about what schedule of regular examinations is right for you. This is a good chance for you to check in with your provider about disease prevention and staying healthy. In between checkups, there are plenty of things you can do on your  own. Experts have done a lot of research about which lifestyle changes and preventive measures are most likely to keep you healthy. Ask your health care provider for more information. Weight and diet Eat a healthy diet  Be sure to include plenty of vegetables, fruits, low-fat dairy products, and lean protein.  Do not eat a lot of foods high in solid fats, added sugars, or salt.  Get regular exercise. This is one of the most important things you can do for your health. ? Most adults should exercise for at least 150 minutes each week. The exercise should increase your heart rate and make you sweat (moderate-intensity exercise). ? Most adults  should also do strengthening exercises at least twice a week. This is in addition to the moderate-intensity exercise. Maintain a healthy weight  Body mass index (BMI) is a measurement that can be used to identify possible weight problems. It estimates body fat based on height and weight. Your health care provider can help determine your BMI and help you achieve or maintain a healthy weight.  For females 7 years of age and older: ? A BMI below 18.5 is considered underweight. ? A BMI of 18.5 to 24.9 is normal. ? A BMI of 25 to 29.9 is considered overweight. ? A BMI of 30 and above is considered obese. Watch levels of cholesterol and blood lipids  You should start having your blood tested for lipids and cholesterol at 65 years of age, then have this test every 5 years.  You may need to have your cholesterol levels checked more often if: ? Your lipid or cholesterol levels are high. ? You are older than 65 years of age. ? You are at high risk for heart disease. Cancer screening Lung Cancer  Lung cancer screening is recommended for adults 66-63 years old who are at high risk for lung cancer because of a history of smoking.  A yearly low-dose CT scan of the lungs is recommended for people who: ? Currently smoke. ? Have quit within the past 15 years. ? Have at least a 30-pack-year history of smoking. A pack year is smoking an average of one pack of cigarettes a day for 1 year.  Yearly screening should continue until it has been 15 years since you quit.  Yearly screening should stop if you develop a health problem that would prevent you from having lung cancer treatment. Breast Cancer  Practice breast self-awareness. This means understanding how your breasts normally appear and feel.  It also means doing regular breast self-exams. Let your health care provider know about any changes, no matter how small.  If you are in your 20s or 30s, you should have a clinical breast exam (CBE) by a  health care provider every 1-3 years as part of a regular health exam.  If you are 64 or older, have a CBE every year. Also consider having a breast X-ray (mammogram) every year.  If you have a family history of breast cancer, talk to your health care provider about genetic screening.  If you are at high risk for breast cancer, talk to your health care provider about having an MRI and a mammogram every year.  Breast cancer gene (BRCA) assessment is recommended for women who have family members with BRCA-related cancers. BRCA-related cancers include: ? Breast. ? Ovarian. ? Tubal. ? Peritoneal cancers.  Results of the assessment will determine the need for genetic counseling and BRCA1 and BRCA2 testing. Cervical Cancer Your health care provider may recommend  that you be screened regularly for cancer of the pelvic organs (ovaries, uterus, and vagina). This screening involves a pelvic examination, including checking for microscopic changes to the surface of your cervix (Pap test). You may be encouraged to have this screening done every 3 years, beginning at age 49.  For women ages 72-65, health care providers may recommend pelvic exams and Pap testing every 3 years, or they may recommend the Pap and pelvic exam, combined with testing for human papilloma virus (HPV), every 5 years. Some types of HPV increase your risk of cervical cancer. Testing for HPV may also be done on women of any age with unclear Pap test results.  Other health care providers may not recommend any screening for nonpregnant women who are considered low risk for pelvic cancer and who do not have symptoms. Ask your health care provider if a screening pelvic exam is right for you.  If you have had past treatment for cervical cancer or a condition that could lead to cancer, you need Pap tests and screening for cancer for at least 20 years after your treatment. If Pap tests have been discontinued, your risk factors (such as having a  new sexual partner) need to be reassessed to determine if screening should resume. Some women have medical problems that increase the chance of getting cervical cancer. In these cases, your health care provider may recommend more frequent screening and Pap tests. Colorectal Cancer  This type of cancer can be detected and often prevented.  Routine colorectal cancer screening usually begins at 65 years of age and continues through 65 years of age.  Your health care provider may recommend screening at an earlier age if you have risk factors for colon cancer.  Your health care provider may also recommend using home test kits to check for hidden blood in the stool.  A small camera at the end of a tube can be used to examine your colon directly (sigmoidoscopy or colonoscopy). This is done to check for the earliest forms of colorectal cancer.  Routine screening usually begins at age 71.  Direct examination of the colon should be repeated every 5-10 years through 65 years of age. However, you may need to be screened more often if early forms of precancerous polyps or small growths are found. Skin Cancer  Check your skin from head to toe regularly.  Tell your health care provider about any new moles or changes in moles, especially if there is a change in a mole's shape or color.  Also tell your health care provider if you have a mole that is larger than the size of a pencil eraser.  Always use sunscreen. Apply sunscreen liberally and repeatedly throughout the day.  Protect yourself by wearing long sleeves, pants, a wide-brimmed hat, and sunglasses whenever you are outside. Heart disease, diabetes, and high blood pressure  High blood pressure causes heart disease and increases the risk of stroke. High blood pressure is more likely to develop in: ? People who have blood pressure in the high end of the normal range (130-139/85-89 mm Hg). ? People who are overweight or obese. ? People who are  African American.  If you are 26-22 years of age, have your blood pressure checked every 3-5 years. If you are 27 years of age or older, have your blood pressure checked every year. You should have your blood pressure measured twice-once when you are at a hospital or clinic, and once when you are not at a hospital  or clinic. Record the average of the two measurements. To check your blood pressure when you are not at a hospital or clinic, you can use: ? An automated blood pressure machine at a pharmacy. ? A home blood pressure monitor.  If you are between 11 years and 73 years old, ask your health care provider if you should take aspirin to prevent strokes.  Have regular diabetes screenings. This involves taking a blood sample to check your fasting blood sugar level. ? If you are at a normal weight and have a low risk for diabetes, have this test once every three years after 65 years of age. ? If you are overweight and have a high risk for diabetes, consider being tested at a younger age or more often. Preventing infection Hepatitis B  If you have a higher risk for hepatitis B, you should be screened for this virus. You are considered at high risk for hepatitis B if: ? You were born in a country where hepatitis B is common. Ask your health care provider which countries are considered high risk. ? Your parents were born in a high-risk country, and you have not been immunized against hepatitis B (hepatitis B vaccine). ? You have HIV or AIDS. ? You use needles to inject street drugs. ? You live with someone who has hepatitis B. ? You have had sex with someone who has hepatitis B. ? You get hemodialysis treatment. ? You take certain medicines for conditions, including cancer, organ transplantation, and autoimmune conditions. Hepatitis C  Blood testing is recommended for: ? Everyone born from [birth year] through 1967. ? Anyone with known risk factors for hepatitis C. Sexually transmitted infections  (STIs)  You should be screened for sexually transmitted infections (STIs) including gonorrhea and chlamydia if: ? You are sexually active and are younger than 65 years of age. ? You are older than 65 years of age and your health care provider tells you that you are at risk for this type of infection. ? Your sexual activity has changed since you were last screened and you are at an increased risk for chlamydia or gonorrhea. Ask your health care provider if you are at risk.  If you do not have HIV, but are at risk, it may be recommended that you take a prescription medicine daily to prevent HIV infection. This is called pre-exposure prophylaxis (PrEP). You are considered at risk if: ? You are sexually active and do not regularly use condoms or know the HIV status of your partner(s). ? You take drugs by injection. ? You are sexually active with a partner who has HIV. Talk with your health care provider about whether you are at high risk of being infected with HIV. If you choose to begin PrEP, you should first be tested for HIV. You should then be tested every 3 months for as long as you are taking PrEP. Pregnancy  If you are premenopausal and you may become pregnant, ask your health care provider about preconception counseling.  If you may become pregnant, take 400 to 800 micrograms (mcg) of folic acid every day.  If you want to prevent pregnancy, talk to your health care provider about birth control (contraception). Osteoporosis and menopause  Osteoporosis is a disease in which the bones lose minerals and strength with aging. This can result in serious bone fractures. Your risk for osteoporosis can be identified using a bone density scan.  If you are 66 years of age or older, or if you are  at risk for osteoporosis and fractures, ask your health care provider if you should be screened.  Ask your health care provider whether you should take a calcium or vitamin D supplement to lower your risk  for osteoporosis.  Menopause may have certain physical symptoms and risks.  Hormone replacement therapy may reduce some of these symptoms and risks. Talk to your health care provider about whether hormone replacement therapy is right for you. Follow these instructions at home:  Schedule regular health, dental, and eye exams.  Stay current with your immunizations.  Do not use any tobacco products including cigarettes, chewing tobacco, or electronic cigarettes.  If you are pregnant, do not drink alcohol.  If you are breastfeeding, limit how much and how often you drink alcohol.  Limit alcohol intake to no more than 1 drink per day for nonpregnant women. One drink equals 12 ounces of beer, 5 ounces of wine, or 1 ounces of hard liquor.  Do not use street drugs.  Do not share needles.  Ask your health care provider for help if you need support or information about quitting drugs.  Tell your health care provider if you often feel depressed.  Tell your health care provider if you have ever been abused or do not feel safe at home. This information is not intended to replace advice given to you by your health care provider. Make sure you discuss any questions you have with your health care provider. Document Released: 10/24/2010 Document Revised: 09/16/2015 Document Reviewed: 01/12/2015 Elsevier Interactive Patient Education  2019 [employer].

## 2018-05-15 NOTE — Assessment & Plan Note (Signed)
Start hibiclens topical daily, 3 month course of doxy BID Also starting metformin for prediabetes to see if this will help recurrent boils All medication dosing, side effects discussed Home management, red flags and return precautions including when to seek immediate care discussed and printed on AVS Recommend referral to dermatology for further evaluation, management, she is agreeable RTC in 3 months for F/U - metFORMIN (GLUCOPHAGE) 500 MG tablet; Take 1 tablet (500 mg total) by mouth daily with breakfast.  Dispense: 30 tablet; Refill: 3 - doxycycline (VIBRA-TABS) 100 MG tablet; Take 1 tablet (100 mg total) by mouth 2 (two) times daily.  Dispense: 60 tablet; Refill: 2 - Ambulatory referral to Dermatology - Misc. Devices (HIBICLENS HAND PUMP 32OZ) MISC; 1 Pump by Does not apply route daily as needed (to boils).  Dispense: 1 each; Refill: 3

## 2018-05-15 NOTE — Progress Notes (Signed)
Suzanne [name] is a 63 y.o. female with the following history as recorded in EpicCare:  Patient Active Problem List   Diagnosis Date Noted  . Right arm pain 04/01/2018  . Recurrent boils 04/01/2018  . Tinea pedis 08/10/2016  . Hematochezia 11/03/2015  . Abdominal pain 11/03/2015  . Encounter for general adult medical examination with abnormal findings 08/23/2015  . Paresthesia 11/02/2014  . RLQ abdominal pain 10/28/2014  . Adult stuttering   . Anxiety 11/23/2010  . RESTLESS LEG SYNDROME 01/19/2010  . POSTMENOPAUSAL STATUS 10/08/2009  . OBESITY 07/21/2009  . 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 GERD 07/07/2009  . Essential hypertension 01/13/2009  . Diabetes mellitus type 2, diet-controlled (HCC) 01/13/2009    Current Outpatient Medications  Medication Sig Dispense Refill  . metoprolol succinate (TOPROL-XL) 100 MG 24 hr tablet TAKE 1 TABLET BY MOUTH ONCE DAILY WITH OR IMMEDIATELY FOLLOWING A MEAL 90 tablet 1  . venlafaxine XR (EFFEXOR-XR) 150 MG 24 hr capsule TAKE ONE CAPSULE BY MOUTH ONCE DAILY WITH BREAKFAST 90 capsule 3  . doxycycline (VIBRA-TABS) 100 MG tablet Take 1 tablet (100 mg total) by mouth 2 (two) times daily. 60 tablet 2  . metFORMIN (GLUCOPHAGE) 500 MG tablet Take 1 tablet (500 mg total) by mouth daily with breakfast. 30 tablet 3  . Misc. Devices (HIBICLENS HAND PUMP 32OZ) MISC 1 Pump by Does not apply route daily as needed (to boils). 1 each 3   No current facility-administered medications for this visit.     Allergies: Other and Shrimp [shellfish allergy]  Past Medical History:  Diagnosis Date  . Anxiety   . Chronic headaches   . Depression   . Diabetes mellitus, type 2 (HCC)   . Diverticulosis   . Gallstones   . GERD   . Hepatitis B   . HYPERTENSION   . OBESITY   . RAYNAUDS SYNDROME   . Seizures [facility](HCC)    age 63 only one time  . Stammering     Past Surgical History:  Procedure Laterality Date  . CHOLECYSTECTOMY  2000  . COLONOSCOPY    . OTHER  SURGICAL HISTORY  2000   ? if for diverticulosis, pt states they went through beely button    Family History  Problem Relation Age of Onset  . Lung cancer Father   . Allergies Brother   . Liver disease Brother   . Allergies Son   . Lung cancer Maternal Aunt   . Colon cancer Maternal Aunt   . Clotting disorder Maternal Aunt   . Diabetes Maternal Aunt   . Pancreatic cancer Maternal Uncle   . Diabetes Paternal Aunt   . Heart disease Paternal Uncle     Social History   Tobacco Use  . Smoking status: Never Smoker  . Smokeless tobacco: Never Used  Substance Use Topics  . Alcohol use: No     Subjective:  Here today for CPE. She is also requesting evaluation of recurrent axillary boils, bilaterally, worse on right, was given 3 week course of bactrim for same in august, symptoms improved, worse again over past month or so, tender boils under both arms, no redness, fevers, drainage. Does uses warm compresses regularly with no relief Last dental exam: 2020 Last vision exam: 2019, will request records from [facility] PAP: overdue, she is requesting referral to GYN No LMP recorded. Patient is postmenopausal. Mammogram- up to date Lung ca screening: n/a, never a smoker Colonoscopy: up to date Lipids: lipid panel ordered DM  screening- a1c ordered Vaccinations: up to date Diet and exercise: planning to walk more this year, has [employer] and is going to change her work shifts so [facility]  Hypertension -maintained on metoprolol succinate 100 Reports daily medication compliance without adverse medication effects, however did skip this morning dose today only due to fasting for labs. Checks her blood pressure regularly at home with normal readings.  BP Readings from Last 3 Encounters:  05/15/18 130/90  04/01/18 128/78  12/07/17 136/74   Anxiety/depresion: maintained on Effexor 150 daily Good control of mood and no adverse  effects.  Review of Systems  Constitutional: Negative for chills and fever.  HENT: Negative for hearing loss.   Eyes: Negative for blurred vision and double vision.  Respiratory: Negative for cough and shortness of breath.   Cardiovascular: Negative for chest pain and palpitations.  Gastrointestinal: Negative for abdominal pain, constipation, diarrhea, heartburn, nausea and vomiting.  Genitourinary: Negative for dysuria and hematuria.  Musculoskeletal: Negative for falls.  Skin: Negative for rash.  Neurological: Negative for dizziness, speech change, loss of consciousness and weakness.  Endo/Heme/Allergies: Does not bruise/bleed easily.  Psychiatric/Behavioral: Negative for depression and suicidal ideas. The patient is not nervous/anxious.     Objective:  Vitals:   05/15/18 1039  BP: 130/90  Pulse: 86  Temp: 97.6 F (36.4 C)  TempSrc: Oral  SpO2: 99%  Weight: 228 lb (103.4 kg)  Height: 5\' 3"  (1.6 m)    General: Well developed, well nourished, in no acute distress  Skin : Warm and dry. Multiple inflamed nodules to bilateral axilla, easily moveable, tender to touch, without erythema or drainage.  Head: Normocephalic and atraumatic  Eyes: Sclera and conjunctiva clear; pupils round and reactive to light; extraocular movements intact  Ears: External normal; canals clear; tympanic membranes normal  Oropharynx: Pink, supple. No suspicious lesions  Neck: Supple without thyromegaly, adenopathy  Lungs: Respirations unlabored; clear to auscultation bilaterally without wheeze, rales, rhonchi  CVS exam: normal rate, regular rhythm, normal S1, S2, no murmurs, rubs, clicks or gallops.  Breasts: def to Mammo Abdomen: Soft; nontender; rotund; no masses or hepatosplenomegaly  GU: defd to GYN Musculoskeletal: No deformities; no active joint inflammation  Extremities: No edema, cyanosis, clubbing  Vessels: Symmetric bilaterally  Neurologic: Alert and oriented; speech intact; face symmetrical;  moves all extremities well; CNII-XII intact without focal deficit  Psychiatric: Normal mood and affect.   Assessment:  1. Encounter for general adult medical examination with abnormal findings   2. Essential hypertension   3. Diabetes mellitus type 2, diet-controlled (HCC)   4. Anxiety   5. Hidradenitis suppurativa   6. Screening for cholesterol level     Plan:   Return in about 3 months (around 08/14/2018) for F/U: hidradenitis, prediabetes. -starting hibiclens, doxycyline - 3 mo course, metformin- repeat A1c, referral to derm today Orders Placed This Encounter  Procedures  . CBC    Standing Status:   Future    Standing Expiration Date:   05/16/2019  . Comprehensive metabolic panel    Standing Status:   Future    Standing Expiration Date:   05/16/2019  . Lipid panel    Standing Status:   Future    Standing Expiration Date:   05/16/2019  . Hemoglobin A1c    Standing Status:   Future    Standing Expiration Date:   05/16/2019  . HIV Antibody (routine testing w rflx)    Standing Status:   Future  Standing Expiration Date:   06/15/2018  . Ambulatory referral to Dermatology    Referral Priority:   Routine    Referral Type:   Consultation    Referral Reason:   Specialty Services Required    Requested Specialty:   Dermatology    Number of Visits Requested:   1  . Ambulatory referral to Gynecology    Referral Priority:   Routine    Referral Type:   Consultation    Referral Reason:   Specialty Services Required    Requested Specialty:   Gynecology    Number of Visits Requested:   1    Requested Prescriptions   Signed Prescriptions Disp Refills  . venlafaxine XR (EFFEXOR-XR) 150 MG 24 hr capsule 90 capsule 3    Sig: TAKE ONE CAPSULE BY MOUTH ONCE DAILY WITH BREAKFAST  . metFORMIN (GLUCOPHAGE) 500 MG tablet 30 tablet 3    Sig: Take 1 tablet (500 mg total) by mouth daily with breakfast.  . doxycycline (VIBRA-TABS) 100 MG tablet 60 tablet 2    Sig: Take 1 tablet (100 mg total) by  mouth 2 (two) times daily.  . Misc. Devices (HIBICLENS HAND PUMP 32OZ) MISC 1 each 3    Sig: 1 Pump by Does not apply route daily as needed (to boils).

## 2018-05-15 NOTE — Assessment & Plan Note (Signed)
Pre-diabetes on last A1c Due to recurrent boils, we discussed initiating low dose metformin, she is agreeable-medication dosing, side effects discussed Update labs today RTC in 3 months for f/u- recheck A1c - CBC; Future - Comprehensive metabolic panel; Future - Lipid panel; Future - Hemoglobin A1c; Future - metFORMIN (GLUCOPHAGE) 500 MG tablet; Take 1 tablet (500 mg total) by mouth daily with breakfast.  Dispense: 30 tablet; Refill: 3

## 2018-05-16 ENCOUNTER — Encounter: Payer: Self-pay | Admitting: Nurse Practitioner

## 2018-05-16 LAB — HIV ANTIBODY (ROUTINE TESTING W REFLEX): HIV 1&2 Ab, 4th Generation: NONREACTIVE

## 2018-05-17 ENCOUNTER — Telehealth: Payer: Self-pay | Admitting: Nurse Practitioner

## 2018-05-17 DIAGNOSIS — E119 Type 2 diabetes mellitus without complications: Secondary | ICD-10-CM

## 2018-05-17 NOTE — Telephone Encounter (Signed)
Copied from CRM [id number]. Topic: Quick Communication - Lab Results (Clinic Use ONLY) >> May 17, 2018  9:39 AM Merrilyn Puma, CMA wrote: Left message for pt to call back Re: recent labs.  PEC may inform patient of results. >> May 17, 2018 11:29 AM Herby Abraham C wrote: Pt is returning call for results.

## 2018-05-17 NOTE — Telephone Encounter (Signed)
Called patient back regarding labs. Lab message given to her with verbal understanding. She agrees to try a statin. But also will change her diet and exercise.

## 2018-05-20 MED ORDER — ATORVASTATIN CALCIUM 10 MG PO TABS: 10.0000 mg | ORAL_TABLET | Freq: Every day | ORAL | 1 refills | Status: DC

## 2018-05-20 NOTE — Telephone Encounter (Signed)
Pt informed of below.  

## 2018-05-20 NOTE — Telephone Encounter (Signed)
I have sent prescription for lipitor 10 mg once daily. Please stop by the lab in about 6 weeks, around first week in march, to have labs rechecked for medication response, labs have been ordered

## 2018-06-21 ENCOUNTER — Other Ambulatory Visit: Payer: Self-pay | Admitting: Nurse Practitioner

## 2018-06-21 DIAGNOSIS — I10 Essential (primary) hypertension: Secondary | ICD-10-CM

## 2018-06-23 DIAGNOSIS — R87610 Atypical squamous cells of undetermined significance on cytologic smear of cervix (ASC-US): Secondary | ICD-10-CM

## 2018-06-23 HISTORY — DX: Atypical squamous cells of undetermined significance on cytologic smear of cervix (ASC-US): R87.610

## 2018-07-11 ENCOUNTER — Ambulatory Visit: Payer: BC Managed Care – PPO | Admitting: Gynecology

## 2018-07-15 ENCOUNTER — Ambulatory Visit: Payer: BC Managed Care – PPO | Admitting: Gynecology

## 2018-07-15 ENCOUNTER — Encounter: Payer: Self-pay | Admitting: Gynecology

## 2018-07-15 ENCOUNTER — Other Ambulatory Visit: Payer: Self-pay

## 2018-07-15 VITALS — BP 134/80 | Ht 63.0 in | Wt 230.0 lb

## 2018-07-15 DIAGNOSIS — N952 Postmenopausal atrophic vaginitis: Secondary | ICD-10-CM | POA: Diagnosis not present

## 2018-07-15 DIAGNOSIS — Z01419 Encounter for gynecological examination (general) (routine) without abnormal findings: Secondary | ICD-10-CM

## 2018-07-15 NOTE — Patient Instructions (Signed)
Follow-up in 1 year for annual exam, sooner if any issues. 

## 2018-07-15 NOTE — Progress Notes (Signed)
    Suzanne [name] 13-May-1955 [id number]        63 y.o.  [id number] for annual gynecologic exam.  She does notice intermittently swellings both under her arms and in her groin that are hard and then usually resolve on their own.  Do not appear to drain.  Not currently having issues with these.  Past medical history,surgical history, problem list, medications, allergies, family history and social history were all reviewed and documented as reviewed in the EPIC chart.  ROS:  Performed with pertinent positives and negatives included in the history, assessment and plan.   Additional significant findings : None   Exam: Kennon Portela assistant Vitals:   07/15/18 0934  BP: 134/80  Weight: 230 lb (104.3 kg)  Height: 5\' 3"  (1.6 m)   Body mass index is 40.74 kg/m.  General appearance:  Normal affect, orientation and appearance. Skin: Grossly normal HEENT: Without gross lesions.  No cervical or supraclavicular adenopathy. Thyroid normal.  Lungs:  Clear without wheezing, rales or rhonchi Cardiac: RR, without RMG Abdominal:  Soft, nontender, without masses, guarding, rebound, organomegaly or hernia Breasts:  Examined lying and sitting without masses, retractions, discharge or axillary adenopathy. Pelvic:  Ext, BUS, Vagina: With atrophic changes  Cervix: With atrophic changes  Uterus: Anteverted, normal size, shape and contour, midline and mobile nontender   Adnexa: Without masses or tenderness    Anus and perineum: Normal   Rectovaginal: Normal sphincter tone without palpated masses or tenderness.    Assessment/Plan:  63 y.o. [id number] female for annual gynecologic exam.   1. Postmenopausal.  No significant menopausal symptoms or any vaginal bleeding.  Report any bleeding. 2. Recurrent nodularities in her groin and under her arms.  It sounds as if she is having recurrent sebaceous cysts.  They do not sound inflammatory such as hydradenitis as they do not become red, very tender or  draining.  Exam today shows no axillary or mons/inguinal abnormalities.  Will monitor for now and present if she has any recurrences that are persistent or bothersome. 3. Mammography due now and she is going to call and schedule.  Breast exam normal today.  SBE monthly reviewed. 4. DEXA 2015 normal.  Recommend repeat DEXA latter part of this year after she turns 63.  Patient agrees to schedule and follow-up for this 5. Colonoscopy 2017.  Repeat at their recommended interval. 6. Pap smear 2014.  Pap smear done today.  No history of significant abnormal Pap smears. 7. Health maintenance.  No routine lab work done as patient reports this done elsewhere.  Follow-up 1 year, sooner as needed.   Dara Lords MD, 10:37 AM 07/15/2018

## 2018-07-15 NOTE — Addendum Note (Signed)
Addended by: Dayna Barker on: 07/15/2018 10:46 AM   Modules accepted: Orders

## 2018-07-17 ENCOUNTER — Encounter: Payer: Self-pay | Admitting: Gynecology

## 2018-07-17 LAB — HUMAN PAPILLOMAVIRUS, HIGH RISK: HPV DNA High Risk: NOT DETECTED

## 2018-07-17 LAB — PAP IG W/ RFLX HPV ASCU

## 2018-08-15 ENCOUNTER — Ambulatory Visit: Payer: BC Managed Care – PPO | Admitting: Nurse Practitioner

## 2018-09-10 ENCOUNTER — Telehealth: Payer: Self-pay | Admitting: Internal Medicine

## 2018-09-10 NOTE — Telephone Encounter (Signed)
LVM for patient to call back to make appt  °

## 2018-09-10 NOTE — Telephone Encounter (Signed)
-----   Message from Corwin Levins, MD sent at 09/08/2018  9:17 PM EDT ----- Regarding: TOC Ok for [facility] at any time, or CPX after May 15 2019

## 2018-09-11 ENCOUNTER — Other Ambulatory Visit: Payer: Self-pay | Admitting: *Deleted

## 2018-09-11 MED ORDER — ATORVASTATIN CALCIUM 10 MG PO TABS: 10.0000 mg | ORAL_TABLET | Freq: Every day | ORAL | 1 refills | Status: DC

## 2018-09-23 ENCOUNTER — Other Ambulatory Visit: Payer: Self-pay | Admitting: *Deleted

## 2018-09-23 DIAGNOSIS — I10 Essential (primary) hypertension: Secondary | ICD-10-CM

## 2018-09-23 MED ORDER — METOPROLOL SUCCINATE ER 100 MG PO TB24: ORAL_TABLET | ORAL | 0 refills | Status: DC

## 2018-10-14 ENCOUNTER — Other Ambulatory Visit: Payer: Self-pay

## 2018-10-15 ENCOUNTER — Other Ambulatory Visit: Payer: Self-pay | Admitting: Gynecology

## 2018-10-15 ENCOUNTER — Ambulatory Visit (INDEPENDENT_AMBULATORY_CARE_PROVIDER_SITE_OTHER): Payer: BC Managed Care – PPO

## 2018-10-15 DIAGNOSIS — Z780 Asymptomatic menopausal state: Secondary | ICD-10-CM

## 2018-10-15 DIAGNOSIS — Z13820 Encounter for screening for osteoporosis: Secondary | ICD-10-CM

## 2018-10-15 DIAGNOSIS — Z01419 Encounter for gynecological examination (general) (routine) without abnormal findings: Secondary | ICD-10-CM

## 2018-10-22 ENCOUNTER — Encounter: Payer: Self-pay | Admitting: Gynecology

## 2018-12-26 ENCOUNTER — Other Ambulatory Visit: Payer: Self-pay | Admitting: Internal Medicine

## 2018-12-26 DIAGNOSIS — I10 Essential (primary) hypertension: Secondary | ICD-10-CM

## 2018-12-27 ENCOUNTER — Telehealth: Payer: Self-pay

## 2018-12-27 NOTE — Telephone Encounter (Signed)
Appointment has been made with Dr.John.  °

## 2018-12-27 NOTE — Telephone Encounter (Signed)
This is a patient that Dr. Jenny Reichmann was planning to take on. He had contacted her in May to schedule a follow-up.

## 2018-12-27 NOTE — Telephone Encounter (Signed)
Copied from [id number]. Topic: Appointment Scheduling - Scheduling Inquiry for Clinic >> Dec 26, 2018  3:44 PM Alanda Slim E wrote: Reason for CRM: Pt would like to schedule a CPE appt and would like see if Jodi Mourning will approve taking her as a TOC . Pt needs a CPE this month to qualify for no copay and would like appt to be in the morning/ please advise >> Dec 26, 2018  4:03 PM Lonia Skinner [place] A wrote: I informed the agent on the phone that getting the patient in this month for a TOC/CPE she should try another office.   But please advise if you accept. Thank you.

## 2018-12-31 ENCOUNTER — Other Ambulatory Visit: Payer: Self-pay

## 2018-12-31 ENCOUNTER — Ambulatory Visit (INDEPENDENT_AMBULATORY_CARE_PROVIDER_SITE_OTHER): Payer: Medicare Other | Admitting: Internal Medicine

## 2018-12-31 ENCOUNTER — Encounter: Payer: Self-pay | Admitting: Internal Medicine

## 2018-12-31 VITALS — BP 126/84 | HR 70 | Temp 97.9°F | Ht 63.0 in | Wt 226.0 lb

## 2018-12-31 DIAGNOSIS — L732 Hidradenitis suppurativa: Secondary | ICD-10-CM

## 2018-12-31 DIAGNOSIS — Z23 Encounter for immunization: Secondary | ICD-10-CM

## 2018-12-31 DIAGNOSIS — E538 Deficiency of other specified B group vitamins: Secondary | ICD-10-CM

## 2018-12-31 DIAGNOSIS — R7303 Prediabetes: Secondary | ICD-10-CM | POA: Diagnosis not present

## 2018-12-31 DIAGNOSIS — I10 Essential (primary) hypertension: Secondary | ICD-10-CM

## 2018-12-31 DIAGNOSIS — E611 Iron deficiency: Secondary | ICD-10-CM

## 2018-12-31 DIAGNOSIS — K219 Gastro-esophageal reflux disease without esophagitis: Secondary | ICD-10-CM

## 2018-12-31 DIAGNOSIS — E559 Vitamin D deficiency, unspecified: Secondary | ICD-10-CM

## 2018-12-31 DIAGNOSIS — F419 Anxiety disorder, unspecified: Secondary | ICD-10-CM | POA: Diagnosis not present

## 2018-12-31 DIAGNOSIS — L0293 Carbuncle, unspecified: Secondary | ICD-10-CM

## 2018-12-31 DIAGNOSIS — Z0000 Encounter for general adult medical examination without abnormal findings: Secondary | ICD-10-CM

## 2018-12-31 DIAGNOSIS — E119 Type 2 diabetes mellitus without complications: Secondary | ICD-10-CM

## 2018-12-31 LAB — POCT GLYCOSYLATED HEMOGLOBIN (HGB A1C): Hemoglobin A1C: 6 % — AB (ref 4.0–5.6)

## 2018-12-31 MED ORDER — METOPROLOL SUCCINATE ER 100 MG PO TB24: ORAL_TABLET | ORAL | 3 refills | Status: DC

## 2018-12-31 MED ORDER — METFORMIN HCL 500 MG PO TABS: 500.0000 mg | ORAL_TABLET | Freq: Every day | ORAL | 3 refills | Status: DC

## 2018-12-31 MED ORDER — DOXYCYCLINE HYCLATE 100 MG PO TABS: 100.0000 mg | ORAL_TABLET | Freq: Two times a day (BID) | ORAL | 2 refills | Status: DC

## 2018-12-31 MED ORDER — MUPIROCIN CALCIUM 2 % NA OINT: 1.0000 "application " | TOPICAL_OINTMENT | Freq: Two times a day (BID) | NASAL | 0 refills | Status: DC

## 2018-12-31 MED ORDER — ATORVASTATIN CALCIUM 10 MG PO TABS: 10.0000 mg | ORAL_TABLET | Freq: Every day | ORAL | 3 refills | Status: DC

## 2018-12-31 MED ORDER — VENLAFAXINE HCL ER 150 MG PO CP24: ORAL_CAPSULE | ORAL | 3 refills | Status: DC

## 2018-12-31 NOTE — Assessment & Plan Note (Signed)
stable overall by history and exam, recent data reviewed with pt, and pt to continue medical treatment as before,  to f/u any worsening symptoms or concerns  

## 2018-12-31 NOTE — Assessment & Plan Note (Signed)
Ok for mupirocin asd, and refill doxy prn,  to f/u any worsening symptoms or concerns

## 2018-12-31 NOTE — Patient Instructions (Addendum)
You had the flu shot today, and the Pneumovax pneumonia shot  Your A1c was OK today  Please take all new medication as prescribed - the nosal ointment antibiotic  Please continue all other medications as before, and refills have been done if requested.  Please have the pharmacy call with any other refills you may need.  Please continue your efforts at being more active, low cholesterol diet, and weight control  Please keep your appointments with your specialists as you may have planned  Please return in 6 months, or sooner if needed, with Lab testing done 3-5 days before

## 2018-12-31 NOTE — Progress Notes (Signed)
Subjective:    Patient ID: Suzanne [name], female    DOB: 1955-09-13, 63 y.o.   MRN: [id number]  HPI  Here to f/u; overall doing ok,  Pt denies chest pain, increasing sob or doe, wheezing, orthopnea, PND, increased LE swelling, palpitations, dizziness or syncope.  Pt denies new neurological symptoms such as new headache, or facial or extremity weakness or numbness.  Pt denies polydipsia, polyuria, or low sugar episode.  Pt states overall good compliance with meds, mostly trying to follow appropriate diet, with wt overall stable,  Does have recurrent boils ongoing for at least one yr, no prior MRSA pcr.  No other new complaints  Denies worsening reflux, abd pain, dysphagia, n/v, bowel change or blood. Past Medical History:  Diagnosis Date  . Anxiety   . ASCUS of cervix with negative high risk HPV 06/2018  . Chronic headaches   . Depression   . Diabetes mellitus, type 2 (HCC)   . Diverticulosis   . Gallstones   . GERD   . Hepatitis B   . HYPERTENSION   . OBESITY   . RAYNAUDS SYNDROME   . Seizures [facility](HCC)    age 63 only one time  . Stammering    Past Surgical History:  Procedure Laterality Date  . CHOLECYSTECTOMY  2000  . COLONOSCOPY    . OTHER SURGICAL HISTORY  2000   ? if for diverticulosis, pt states they went through beely button    reports that she has never smoked. She has never used smokeless tobacco. She reports that she does not drink alcohol or use drugs. family history includes Allergies in her brother and son; Clotting disorder in her maternal aunt; Colon cancer in her maternal aunt; Diabetes in her maternal aunt and paternal aunt; Heart disease in her paternal uncle; Liver disease in her brother; Lung cancer in her father and maternal aunt; Pancreatic cancer in her maternal uncle. Allergies  Allergen Reactions  . Other Swelling    ALL SEAFOOD  . Shrimp [Shellfish Allergy] Swelling    Pt states she allergic to seafood   Current Outpatient Medications on File  Prior to Visit  Medication Sig Dispense Refill  . Misc. Devices (HIBICLENS HAND PUMP 32OZ) MISC 1 Pump by Does not apply route daily as needed (to boils). 1 each 3   No current facility-administered medications on file prior to visit.    Review of Systems  Constitutional: Negative for other unusual diaphoresis or sweats HENT: Negative for ear discharge or swelling Eyes: Negative for other worsening visual disturbances Respiratory: Negative for stridor or other swelling  Gastrointestinal: Negative for worsening distension or other blood Genitourinary: Negative for retention or other urinary change Musculoskeletal: Negative for other MSK pain or swelling Skin: Negative for color change or other new lesions Neurological: Negative for worsening tremors and other numbness  Psychiatric/Behavioral: Negative for worsening agitation or other fatigue All other system neg per pt    Objective:   Physical Exam BP 126/84   Pulse 70   Temp 97.9 F (36.6 C) (Oral)   Ht 5\' 3"  (1.6 m)   Wt 226 lb (102.5 kg)   SpO2 98%   BMI 40.03 kg/m  VS noted,  Constitutional: Pt appears in NAD HENT: Head: NCAT.  Right Ear: External ear normal.  Left Ear: External ear normal.  Eyes: . Pupils are equal, round, and reactive to light. Conjunctivae and EOM are normal Nose: without d/c or deformity Neck: Neck supple. Gross normal ROM Cardiovascular: Normal rate  and regular rhythm.   Pulmonary/Chest: Effort normal and breath sounds without rales or wheezing.  Abd:  Soft, NT, ND, + BS, no organomegaly Neurological: Pt is alert. At baseline orientation, motor grossly intact Skin: Skin is warm. No rashes, other new lesions, no LE edema Psychiatric: Pt behavior is normal without agitation  No other exam findings  POCT glycosylated hemoglobin (Hb A1C) Order: [id number] Status:  Final result Visible to patient:  No (not released) Dx:  Pre-diabetes  Ref Range & Units 12:02 75mo ago 50yr ago 35yr ago 51yr ago 87yr ago  19yr ago  Hemoglobin A1C 4.0 - 5.6 % 6.0Abnormal   6.3 R, CM  6.3 R, CM  6.4 R, CM  6.3 R, CM  6.3 R, CM  6.0 R, C           Assessment & Plan:

## 2019-01-29 ENCOUNTER — Encounter: Payer: Self-pay | Admitting: Gynecology

## 2019-03-28 ENCOUNTER — Other Ambulatory Visit: Payer: Self-pay

## 2019-03-28 DIAGNOSIS — Z20822 Contact with and (suspected) exposure to covid-19: Secondary | ICD-10-CM

## 2019-03-31 LAB — NOVEL CORONAVIRUS, NAA: SARS-CoV-2, NAA: NOT DETECTED

## 2019-07-01 ENCOUNTER — Other Ambulatory Visit: Payer: Self-pay

## 2019-07-01 ENCOUNTER — Encounter: Payer: Self-pay | Admitting: Internal Medicine

## 2019-07-01 ENCOUNTER — Ambulatory Visit: Payer: Medicare PPO | Admitting: Internal Medicine

## 2019-07-01 VITALS — BP 160/92 | HR 68 | Temp 98.0°F | Ht 63.0 in | Wt 235.0 lb

## 2019-07-01 DIAGNOSIS — B353 Tinea pedis: Secondary | ICD-10-CM | POA: Diagnosis not present

## 2019-07-01 DIAGNOSIS — F419 Anxiety disorder, unspecified: Secondary | ICD-10-CM

## 2019-07-01 DIAGNOSIS — Z0001 Encounter for general adult medical examination with abnormal findings: Secondary | ICD-10-CM | POA: Diagnosis not present

## 2019-07-01 DIAGNOSIS — Z0000 Encounter for general adult medical examination without abnormal findings: Secondary | ICD-10-CM

## 2019-07-01 DIAGNOSIS — I10 Essential (primary) hypertension: Secondary | ICD-10-CM

## 2019-07-01 DIAGNOSIS — E559 Vitamin D deficiency, unspecified: Secondary | ICD-10-CM

## 2019-07-01 DIAGNOSIS — L609 Nail disorder, unspecified: Secondary | ICD-10-CM | POA: Diagnosis not present

## 2019-07-01 DIAGNOSIS — R7303 Prediabetes: Secondary | ICD-10-CM

## 2019-07-01 DIAGNOSIS — E538 Deficiency of other specified B group vitamins: Secondary | ICD-10-CM | POA: Diagnosis not present

## 2019-07-01 LAB — CBC WITH DIFFERENTIAL/PLATELET
Basophils Absolute: 0.1 10*3/uL (ref 0.0–0.1)
Basophils Relative: 1.3 % (ref 0.0–3.0)
Eosinophils Absolute: 0.1 10*3/uL (ref 0.0–0.7)
Eosinophils Relative: 3.1 % (ref 0.0–5.0)
HCT: 37.5 % (ref 36.0–46.0)
Hemoglobin: 12.3 g/dL (ref 12.0–15.0)
Lymphocytes Relative: 31.3 % (ref 12.0–46.0)
Lymphs Abs: 1.3 10*3/uL (ref 0.7–4.0)
MCHC: 32.9 g/dL (ref 30.0–36.0)
MCV: 87 fl (ref 78.0–100.0)
Monocytes Absolute: 0.3 10*3/uL (ref 0.1–1.0)
Monocytes Relative: 7.4 % (ref 3.0–12.0)
Neutro Abs: 2.3 10*3/uL (ref 1.4–7.7)
Neutrophils Relative %: 56.9 % (ref 43.0–77.0)
Platelets: 277 10*3/uL (ref 150.0–400.0)
RBC: 4.31 Mil/uL (ref 3.87–5.11)
RDW: 15.3 % (ref 11.5–15.5)
WBC: 4.1 10*3/uL (ref 4.0–10.5)

## 2019-07-01 LAB — BASIC METABOLIC PANEL
BUN: 13 mg/dL (ref 6–23)
CO2: 31 mEq/L (ref 19–32)
Calcium: 9.1 mg/dL (ref 8.4–10.5)
Chloride: 105 mEq/L (ref 96–112)
Creatinine, Ser: 0.74 mg/dL (ref 0.40–1.20)
GFR: 95.11 mL/min (ref >60.00)
Glucose, Bld: 104 mg/dL — ABNORMAL HIGH (ref 70–99)
Potassium: 3.8 mEq/L (ref 3.5–5.1)
Sodium: 139 mEq/L (ref 135–145)

## 2019-07-01 LAB — MICROALBUMIN / CREATININE URINE RATIO
Creatinine,U: 165.1 mg/dL
Microalb Creat Ratio: 0.7 mg/g (ref 0.0–30.0)
Microalb, Ur: 1.2 mg/dL (ref 0.0–1.9)

## 2019-07-01 LAB — URINALYSIS, ROUTINE W REFLEX MICROSCOPIC
Bilirubin Urine: NEGATIVE
Hgb urine dipstick: NEGATIVE
Ketones, ur: NEGATIVE
Leukocytes,Ua: NEGATIVE
Nitrite: NEGATIVE
Specific Gravity, Urine: 1.025 (ref 1.000–1.030)
Total Protein, Urine: NEGATIVE
Urine Glucose: NEGATIVE
Urobilinogen, UA: 0.2 (ref 0.0–1.0)
pH: 6.5 (ref 5.0–8.0)

## 2019-07-01 LAB — VITAMIN D 25 HYDROXY (VIT D DEFICIENCY, FRACTURES): VITD: 38.7 ng/mL (ref 30.00–100.00)

## 2019-07-01 LAB — LIPID PANEL
Cholesterol: 147 mg/dL (ref 0–200)
HDL: 43.9 mg/dL (ref >39.00)
LDL Cholesterol: 93 mg/dL (ref 0–99)
NonHDL: 103.57
Total CHOL/HDL Ratio: 3
Triglycerides: 55 mg/dL (ref 0.0–149.0)
VLDL: 11 mg/dL (ref 0.0–40.0)

## 2019-07-01 LAB — HEMOGLOBIN A1C: Hgb A1c MFr Bld: 6.3 % (ref 4.6–6.5)

## 2019-07-01 LAB — HEPATIC FUNCTION PANEL
ALT: 12 U/L (ref 0–35)
AST: 17 U/L (ref 0–37)
Albumin: 3.8 g/dL (ref 3.5–5.2)
Alkaline Phosphatase: 100 U/L (ref 39–117)
Bilirubin, Direct: 0.1 mg/dL (ref 0.0–0.3)
Total Bilirubin: 0.3 mg/dL (ref 0.2–1.2)
Total Protein: 7.5 g/dL (ref 6.0–8.3)

## 2019-07-01 LAB — TSH: TSH: 1.19 u[IU]/mL (ref 0.35–4.50)

## 2019-07-01 LAB — VITAMIN B12: Vitamin B-12: 467 pg/mL (ref 211–911)

## 2019-07-01 MED ORDER — CLOTRIMAZOLE-BETAMETHASONE 1-0.05 % EX CREA: TOPICAL_CREAM | CUTANEOUS | 1 refills | Status: AC

## 2019-07-01 NOTE — Assessment & Plan Note (Signed)
stable overall by history and exam, recent data reviewed with pt, and pt to continue medical treatment as before,  to f/u any worsening symptoms or concerns  

## 2019-07-01 NOTE — Assessment & Plan Note (Signed)
For lotrison cr prn

## 2019-07-01 NOTE — Progress Notes (Signed)
Subjective:    Patient ID: Suzanne [name], female    DOB: Dec 09, 1955, 64 y.o.   MRN: [id number]  HPI  Here for wellness and f/u;  Overall doing ok;  Pt denies Chest pain, worsening SOB, DOE, wheezing, orthopnea, PND, worsening LE edema, palpitations, dizziness or syncope.  Pt denies neurological change such as new headache, facial or extremity weakness.  Pt denies polydipsia, polyuria, or low sugar symptoms. Pt states overall good compliance with treatment and medications, good tolerability, and has been trying to follow appropriate diet.  Pt denies worsening depressive symptoms, suicidal ideation or panic. No fever, night sweats, wt loss, loss of appetite, or other constitutional symptoms.  Pt states good ability with ADL's, has low fall risk, home safety reviewed and adequate, no other significant changes in hearing or vision, and only occasionally active with exercise. BP Readings from Last 3 Encounters:  07/01/19 (!) 160/92  12/31/18 126/84  07/15/18 134/80   Past Medical History:  Diagnosis Date  . Anxiety   . ASCUS of cervix with negative high risk HPV 06/2018  . Chronic headaches   . Depression   . Diabetes mellitus, type 2 (HCC)   . Diverticulosis   . Gallstones   . GERD   . Hepatitis B   . HYPERTENSION   . OBESITY   . RAYNAUDS SYNDROME   . Seizures [facility])    age 89 only one time  . Stammering    Past Surgical History:  Procedure Laterality Date  . CHOLECYSTECTOMY  2000  . COLONOSCOPY    . OTHER SURGICAL HISTORY  2000   ? if for diverticulosis, pt states they went through beely button    reports that she has never smoked. She has never used smokeless tobacco. She reports that she does not drink alcohol or use drugs. family history includes Allergies in her brother and son; Clotting disorder in her maternal aunt; Colon cancer in her maternal aunt; Diabetes in her maternal aunt and paternal aunt; Heart disease in her paternal uncle; Liver disease in her brother;  Lung cancer in her father and maternal aunt; Pancreatic cancer in her maternal uncle. Allergies  Allergen Reactions  . Other Swelling    ALL SEAFOOD  . Shrimp [Shellfish Allergy] Swelling    Pt states she allergic to seafood   Current Outpatient Medications on File Prior to Visit  Medication Sig Dispense Refill  . atorvastatin (LIPITOR) 10 MG tablet Take 1 tablet (10 mg total) by mouth daily. 90 tablet 3  . metFORMIN (GLUCOPHAGE) 500 MG tablet Take 1 tablet (500 mg total) by mouth daily with breakfast. 90 tablet 3  . metoprolol succinate (TOPROL-XL) 100 MG 24 hr tablet TAKE 1 TABLET BY MOUTH ONCE DAILY WITH MEALS OR  IMMEDIATELY  FOLLOWING 90 tablet 3  . Misc. Devices (HIBICLENS HAND PUMP 32OZ) MISC 1 Pump by Does not apply route daily as needed (to boils). 1 each 3  . mupirocin nasal ointment (BACTROBAN) 2 % Place 1 application into the nose 2 (two) times daily. Use one-half of tube in each nostril twice daily for five (5) days. After application, press sides of nose together and gently massage. 10 g 0  . venlafaxine XR (EFFEXOR-XR) 150 MG 24 hr capsule TAKE ONE CAPSULE BY MOUTH ONCE DAILY WITH BREAKFAST 90 capsule 3   No current facility-administered medications on file prior to visit.   Review of Systems All otherwise neg per pt     Objective:   Physical Exam BP Marland Kitchen)  160/92   Pulse 68   Temp 98 F (36.7 C)   Ht 5\' 3"  (1.6 m)   Wt 235 lb (106.6 kg)   SpO2 100%   BMI 41.63 kg/m  VS noted,  Constitutional: Pt appears in NAD HENT: Head: NCAT.  Right Ear: External ear normal.  Left Ear: External ear normal.  Eyes: . Pupils are equal, round, and reactive to light. Conjunctivae and EOM are normal Nose: without d/c or deformity Neck: Neck supple. Gross normal ROM Cardiovascular: Normal rate and regular rhythm.   Pulmonary/Chest: Effort normal and breath sounds without rales or wheezing.  Abd:  Soft, NT, ND, + BS, no organomegaly Neurological: Pt is alert. At baseline  orientation, motor grossly intact Skin: Skin is warm, no LE edema, has tinea between toes, and dark nails to great and 3rd toes Psychiatric: Pt behavior is normal without agitation  All otherwise neg per pt Lab Results  Component Value Date   WBC 4.1 07/01/2019   HGB 12.3 07/01/2019   HCT 37.5 07/01/2019   PLT 277.0 07/01/2019   GLUCOSE 104 (H) 07/01/2019   CHOL 147 07/01/2019   TRIG 55.0 07/01/2019   HDL 43.90 07/01/2019   LDLCALC 93 07/01/2019   ALT 12 07/01/2019   AST 17 07/01/2019   NA 139 07/01/2019   K 3.8 07/01/2019   CL 105 07/01/2019   CREATININE 0.74 07/01/2019   BUN 13 07/01/2019   CO2 31 07/01/2019   TSH 1.19 07/01/2019   HGBA1C 6.3 07/01/2019   MICROALBUR 1.2 07/01/2019        Assessment & Plan:

## 2019-07-01 NOTE — Patient Instructions (Signed)
Please take all new medication as prescribed - the cream as needed  Please continue to monitor your blood pressure at home and call in 2 weeks if more than 140/90  Please continue all other medications as before, and refills have been done if requested.  Please have the pharmacy call with any other refills you may need.  Please continue your efforts at being more active, low cholesterol diet, and weight control.  You are otherwise up to date with prevention measures today.  Please keep your appointments with your specialists as you may have planned  You will be contacted regarding the referral for: podiatry (foot doctor)  Please go to the LAB at the blood drawing area for the tests to be done  You will be contacted by phone if any changes need to be made immediately.  Otherwise, you will receive a letter about your results with an explanation, but please check with MyChart first.  Please remember to sign up for MyChart if you have not done so, as this will be important to you in the future with finding out test results, communicating by private email, and scheduling acute appointments online when needed.  Please make an Appointment to return in 6 months, or sooner if needed

## 2019-07-17 ENCOUNTER — Ambulatory Visit: Payer: Medicare PPO | Admitting: Obstetrics and Gynecology

## 2019-07-17 ENCOUNTER — Encounter: Payer: Self-pay | Admitting: Obstetrics and Gynecology

## 2019-07-17 ENCOUNTER — Other Ambulatory Visit: Payer: Self-pay

## 2019-07-17 VITALS — BP 138/86 | Ht 63.0 in | Wt 233.0 lb

## 2019-07-17 DIAGNOSIS — Z8742 Personal history of other diseases of the female genital tract: Secondary | ICD-10-CM

## 2019-07-17 DIAGNOSIS — R87610 Atypical squamous cells of undetermined significance on cytologic smear of cervix (ASC-US): Secondary | ICD-10-CM | POA: Diagnosis not present

## 2019-07-17 DIAGNOSIS — Z124 Encounter for screening for malignant neoplasm of cervix: Secondary | ICD-10-CM | POA: Diagnosis not present

## 2019-07-17 DIAGNOSIS — Z9189 Other specified personal risk factors, not elsewhere classified: Secondary | ICD-10-CM | POA: Diagnosis not present

## 2019-07-17 DIAGNOSIS — Z01419 Encounter for gynecological examination (general) (routine) without abnormal findings: Secondary | ICD-10-CM | POA: Diagnosis not present

## 2019-07-17 NOTE — Progress Notes (Signed)
Shelby Peltz Souders 09-20-1955 [id number]  SUBJECTIVE:  64 y.o. [id number] female for annual routine gynecologic exam and Pap smear. She has no gynecologic concerns.  Current Outpatient Medications  Medication Sig Dispense Refill  . atorvastatin (LIPITOR) 10 MG tablet Take 1 tablet (10 mg total) by mouth daily. 90 tablet 3  . clotrimazole-betamethasone (LOTRISONE) cream Use as directed twice per day as needed 45 g 1  . metFORMIN (GLUCOPHAGE) 500 MG tablet Take 1 tablet (500 mg total) by mouth daily with breakfast. 90 tablet 3  . metoprolol succinate (TOPROL-XL) 100 MG 24 hr tablet TAKE 1 TABLET BY MOUTH ONCE DAILY WITH MEALS OR  IMMEDIATELY  FOLLOWING 90 tablet 3  . Misc. Devices (HIBICLENS HAND PUMP 32OZ) MISC 1 Pump by Does not apply route daily as needed (to boils). 1 each 3  . mupirocin nasal ointment (BACTROBAN) 2 % Place 1 application into the nose 2 (two) times daily. Use one-half of tube in each nostril twice daily for five (5) days. After application, press sides of nose together and gently massage. 10 g 0  . venlafaxine XR (EFFEXOR-XR) 150 MG 24 hr capsule TAKE ONE CAPSULE BY MOUTH ONCE DAILY WITH BREAKFAST 90 capsule 3   No current facility-administered medications for this visit.   Allergies: Other and Shrimp [shellfish allergy]  No LMP recorded. Patient is postmenopausal.  Past medical history,surgical history, problem list, medications, allergies, family history and social history were all reviewed and documented as reviewed in the EPIC chart.  ROS:  Feeling well. No dyspnea or chest pain on exertion.  No abdominal pain, change in bowel habits, black or bloody stools.  No urinary tract symptoms. GYN ROS: normal menses, no abnormal bleeding, pelvic pain or discharge, no new or enlarging lumps on self exam. Some breast discomfort with certain movements in left upper inner quadrant. No neurological complaints.    OBJECTIVE:  BP 138/86 (Cuff Size: Large)   Ht 5\' 3"  (1.6 m)    Wt 233 lb (105.7 kg)   BMI 41.27 kg/m  The patient appears well, alert, oriented x 3, in no distress. ENT normal.  Neck supple. No cervical or supraclavicular adenopathy or thyromegaly.  Lungs are clear, good air entry, no wheezes, rhonchi or rales. S1 and S2 normal, no murmurs, regular rate and rhythm.  Abdomen soft without tenderness, guarding, mass or organomegaly.  Neurological is normal, no focal findings.  BREAST EXAM: breasts appear normal, no suspicious masses, no skin or nipple changes or axillary nodes  PELVIC EXAM: VULVA: normal appearing vulva with no masses, tenderness or lesions, VAGINA: normal appearing vagina with normal color and discharge, no lesions, CERVIX: normal appearing cervix without discharge or lesions, UTERUS: uterus is normal size, shape, consistency and nontender, ADNEXA: normal adnexa in size, nontender and no masses, RECTAL: normal rectal, no masses, PAP: Pap smear done today, thin-prep method  Chaperone: present during the examination  ASSESSMENT:  64 y.o. 74 here for annual gynecologic exam  PLAN:   1. Postmenopausal. No hot flashes or vaginal bleeding. Doing well. 2. Pap smear 06/2018, ASCUS cytology with negative HPV.  Pap smear collected today per protocol. 3. Mammogram 06/2017.  Normal breast exam today.  She is reminded to schedule an annual mammogram this year as this is overdue. Breast pains in left upper breast sound to be musculoskeletal but even more reason to get her routine mammogram done. 4. Colonoscopy 2017.  Recommended that she follow up at the recommended interval.   5. DEXA 09/2018  showed normal bone density. Next DEXA recommended at 5-year interval in 2025. 6. Health maintenance.  No labs today as she normally has these completed with her primary care provider.  Return annually or sooner, prn.  Joseph Pierini MD, FACOG  07/17/19

## 2019-07-21 LAB — PAP, TP IMAGING W/ HPV RNA, RFLX HPV TYPE 16,18/45: HPV DNA High Risk: NOT DETECTED

## 2019-09-29 ENCOUNTER — Ambulatory Visit (HOSPITAL_COMMUNITY)
Admission: AD | Admit: 2019-09-29 | Discharge: 2019-09-29 | Disposition: A | Discharge status: Home / Self Care | Payer: Medicare PPO | Attending: Family Medicine | Admitting: Family Medicine

## 2019-09-29 DIAGNOSIS — M25521 Pain in right elbow: Secondary | ICD-10-CM | POA: Diagnosis not present

## 2019-09-29 DIAGNOSIS — M545 Low back pain, unspecified: Secondary | ICD-10-CM
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
## 2019-09-29 NOTE — Discharge Instructions (Signed)
Please try ibuprofen for pain  Please try ice  Please follow up if your symptoms fail to improve.

## 2020-02-23 ENCOUNTER — Other Ambulatory Visit: Payer: Self-pay | Admitting: Internal Medicine

## 2020-02-23 DIAGNOSIS — Z1231 Encounter for screening mammogram for malignant neoplasm of breast: Secondary | ICD-10-CM

## 2020-03-24 ENCOUNTER — Other Ambulatory Visit: Payer: Self-pay | Admitting: Internal Medicine

## 2020-03-24 DIAGNOSIS — F419 Anxiety disorder, unspecified: Secondary | ICD-10-CM

## 2020-03-24 NOTE — Telephone Encounter (Signed)
   Patient states she is going out of town tomorrow , requesting refill today

## 2020-03-29 ENCOUNTER — Encounter: Payer: Medicare PPO | Admitting: Internal Medicine

## 2020-03-31 ENCOUNTER — Other Ambulatory Visit: Payer: Self-pay

## 2020-03-31 ENCOUNTER — Ambulatory Visit
Admission: RE | Admit: 2020-03-31 | Discharge: 2020-03-31 | Disposition: A | Discharge status: Home / Self Care | Payer: Medicare PPO | Source: Ambulatory Visit | Attending: Internal Medicine | Admitting: Internal Medicine

## 2020-03-31 DIAGNOSIS — Z1231 Encounter for screening mammogram for malignant neoplasm of breast: Secondary | ICD-10-CM | POA: Diagnosis not present

## 2020-04-01 ENCOUNTER — Ambulatory Visit (INDEPENDENT_AMBULATORY_CARE_PROVIDER_SITE_OTHER): Payer: Medicare PPO | Admitting: Internal Medicine

## 2020-04-01 ENCOUNTER — Encounter: Payer: Self-pay | Admitting: Internal Medicine

## 2020-04-01 VITALS — BP 130/80 | HR 81 | Temp 99.0°F | Ht 63.0 in | Wt 237.0 lb

## 2020-04-01 DIAGNOSIS — I10 Essential (primary) hypertension: Secondary | ICD-10-CM

## 2020-04-01 DIAGNOSIS — F419 Anxiety disorder, unspecified: Secondary | ICD-10-CM | POA: Diagnosis not present

## 2020-04-01 DIAGNOSIS — Z23 Encounter for immunization: Secondary | ICD-10-CM | POA: Diagnosis not present

## 2020-04-01 DIAGNOSIS — Z0000 Encounter for general adult medical examination without abnormal findings: Secondary | ICD-10-CM | POA: Diagnosis not present

## 2020-04-01 DIAGNOSIS — R7303 Prediabetes: Secondary | ICD-10-CM | POA: Diagnosis not present

## 2020-04-01 NOTE — Patient Instructions (Signed)
You had the Prevnar pneumonia shot today  Please check on the insurance to see if they pay for the shingles shot; If so please make a nurse visit appt to have this done, or you can go to any local [facility]  Please continue all other medications as before, and refills have been done if requested.  Please have the pharmacy call with any other refills you may need.  Please continue your efforts at being more active, low cholesterol diet, and weight control.  You are otherwise up to date with prevention measures today.  Please keep your appointments with your specialists as you may have planned  Please make an Appointment to return in 6 months, or sooner if needed

## 2020-04-01 NOTE — Progress Notes (Signed)
Subjective:    Patient ID: Suzanne [name], female    DOB: 1955-05-19, 64 y.o.   MRN: [id number]  HPI  Here to f/u; overall doing ok,  Pt denies chest pain, increasing sob or doe, wheezing, orthopnea, PND, increased LE swelling, palpitations, dizziness or syncope.  Pt denies new neurological symptoms such as new headache, or facial or extremity weakness or numbness.  Pt denies polydipsia, polyuria, or low sugar episode.  Pt states overall good compliance with meds, mostly trying to follow appropriate diet, with wt overall stable,  but little exercise however. Mother died at 58 yo recently with bowel obstruction.  Due for prev Wt Readings from Last 3 Encounters:  04/01/20 237 lb (107.5 kg)  07/17/19 233 lb (105.7 kg)  07/01/19 235 lb (106.6 kg)   Past Medical History:  Diagnosis Date  . Anxiety   . ASCUS of cervix with negative high risk HPV 06/2018  . Chronic headaches   . Depression   . Diabetes mellitus, type 2 (HCC)   . Diverticulosis   . Gallstones   . GERD   . Hepatitis B   . HYPERTENSION   . OBESITY   . RAYNAUDS SYNDROME   . Seizures [facility])    age 44 only one time  . Stammering    Past Surgical History:  Procedure Laterality Date  . CHOLECYSTECTOMY  2000  . COLONOSCOPY    . OTHER SURGICAL HISTORY  2000   ? if for diverticulosis, pt states they went through beely button    reports that she has never smoked. She has never used smokeless tobacco. She reports that she does not drink alcohol and does not use drugs. family history includes Allergies in her brother and son; Clotting disorder in her maternal aunt; Colon cancer in her maternal aunt; Diabetes in her maternal aunt and paternal aunt; Heart disease in her paternal uncle; Liver disease in her brother; Lung cancer in her father and maternal aunt; Pancreatic cancer in her maternal uncle. Allergies  Allergen Reactions  . Other Swelling    ALL SEAFOOD  . Shrimp [Shellfish Allergy] Swelling    Pt states she  allergic to seafood   Current Outpatient Medications on File Prior to Visit  Medication Sig Dispense Refill  . atorvastatin (LIPITOR) 10 MG tablet Take 1 tablet (10 mg total) by mouth daily. 90 tablet 3  . clotrimazole-betamethasone (LOTRISONE) cream Use as directed twice per day as needed 45 g 1  . metFORMIN (GLUCOPHAGE) 500 MG tablet Take 1 tablet (500 mg total) by mouth daily with breakfast. 90 tablet 3  . metoprolol succinate (TOPROL-XL) 100 MG 24 hr tablet TAKE 1 TABLET BY MOUTH ONCE DAILY WITH MEALS OR  IMMEDIATELY  FOLLOWING 90 tablet 3  . Misc. Devices (HIBICLENS HAND PUMP 32OZ) MISC 1 Pump by Does not apply route daily as needed (to boils). 1 each 3  . mupirocin nasal ointment (BACTROBAN) 2 % Place 1 application into the nose 2 (two) times daily. Use one-half of tube in each nostril twice daily for five (5) days. After application, press sides of nose together and gently massage. 10 g 0  . venlafaxine XR (EFFEXOR-XR) 150 MG 24 hr capsule TAKE 1 CAPSULE BY MOUTH ONCE DAILY WITH BREAKFAST 90 capsule 0   No current facility-administered medications on file prior to visit.   Review of Systems All otherwise neg per pt     Objective:   Physical Exam BP 130/80 (BP Location: Left Arm, Patient Position: Sitting, Cuff  Size: Large)   Pulse 81   Temp 99 F (37.2 C) (Oral)   Ht 5\' 3"  (1.6 m)   Wt 237 lb (107.5 kg)   SpO2 98%   BMI 41.98 kg/m  VS noted,  Constitutional: Pt appears in NAD HENT: Head: NCAT.  Right Ear: External ear normal.  Left Ear: External ear normal.  Eyes: . Pupils are equal, round, and reactive to light. Conjunctivae and EOM are normal Nose: without d/c or deformity Neck: Neck supple. Gross normal ROM Cardiovascular: Normal rate and regular rhythm.   Pulmonary/Chest: Effort normal and breath sounds without rales or wheezing.  Abd:  Soft, NT, ND, + BS, no organomegaly Neurological: Pt is alert. At baseline orientation, motor grossly intact Skin: Skin is warm.  No rashes, other new lesions, no LE edema Psychiatric: Pt behavior is normal without agitation  All otherwise neg per pt Lab Results  Component Value Date   WBC 4.1 07/01/2019   HGB 12.3 07/01/2019   HCT 37.5 07/01/2019   PLT 277.0 07/01/2019   GLUCOSE 104 (H) 07/01/2019   CHOL 147 07/01/2019   TRIG 55.0 07/01/2019   HDL 43.90 07/01/2019   LDLCALC 93 07/01/2019   ALT 12 07/01/2019   AST 17 07/01/2019   NA 139 07/01/2019   K 3.8 07/01/2019   CL 105 07/01/2019   CREATININE 0.74 07/01/2019   BUN 13 07/01/2019   CO2 31 07/01/2019   TSH 1.19 07/01/2019   HGBA1C 6.3 07/01/2019   MICROALBUR 1.2 07/01/2019      Assessment & Plan:

## 2020-04-02 ENCOUNTER — Telehealth: Payer: Self-pay | Admitting: Internal Medicine

## 2020-04-02 ENCOUNTER — Encounter: Payer: Self-pay | Admitting: Internal Medicine

## 2020-04-02 ENCOUNTER — Other Ambulatory Visit: Payer: Self-pay | Admitting: Internal Medicine

## 2020-04-02 DIAGNOSIS — I10 Essential (primary) hypertension: Secondary | ICD-10-CM

## 2020-04-02 NOTE — Assessment & Plan Note (Signed)
stable overall by history and exam, recent data reviewed with pt, and pt to continue medical treatment as before,  to f/u any worsening symptoms or concerns  

## 2020-04-02 NOTE — Telephone Encounter (Signed)
Error

## 2020-04-02 NOTE — Telephone Encounter (Signed)
Please refill as per office routine med refill policy (all routine meds refilled for 3 mo or monthly per pt preference up to one year from last visit, then month to month grace period for 3 mo, then further med refills will have to be denied)  

## 2020-04-08 DIAGNOSIS — H43823 Vitreomacular adhesion, bilateral: Secondary | ICD-10-CM | POA: Diagnosis not present

## 2020-04-08 DIAGNOSIS — H43813 Vitreous degeneration, bilateral: Secondary | ICD-10-CM | POA: Diagnosis not present

## 2020-04-08 DIAGNOSIS — H353132 Nonexudative age-related macular degeneration, bilateral, intermediate dry stage: Secondary | ICD-10-CM | POA: Diagnosis not present

## 2020-06-15 DIAGNOSIS — H547 Unspecified visual loss: Secondary | ICD-10-CM | POA: Diagnosis not present

## 2020-06-15 DIAGNOSIS — H3530 Unspecified macular degeneration: Secondary | ICD-10-CM | POA: Diagnosis not present

## 2020-06-15 DIAGNOSIS — Z6841 Body Mass Index (BMI) 40.0 and over, adult: Secondary | ICD-10-CM | POA: Diagnosis not present

## 2020-06-15 DIAGNOSIS — Z809 Family history of malignant neoplasm, unspecified: Secondary | ICD-10-CM | POA: Diagnosis not present

## 2020-06-15 DIAGNOSIS — I10 Essential (primary) hypertension: Secondary | ICD-10-CM | POA: Diagnosis not present

## 2020-06-15 DIAGNOSIS — I739 Peripheral vascular disease, unspecified: Secondary | ICD-10-CM | POA: Diagnosis not present

## 2020-06-15 DIAGNOSIS — Z823 Family history of stroke: Secondary | ICD-10-CM | POA: Diagnosis not present

## 2020-06-15 DIAGNOSIS — F419 Anxiety disorder, unspecified: Secondary | ICD-10-CM | POA: Diagnosis not present

## 2020-06-16 ENCOUNTER — Telehealth: Payer: Self-pay | Admitting: Internal Medicine

## 2020-06-16 DIAGNOSIS — I739 Peripheral vascular disease, unspecified: Secondary | ICD-10-CM

## 2020-06-16 NOTE — Telephone Encounter (Signed)
Dr. Doree Fudge from [facility]  Performed a PAD test on patient for PVD. The results came back with severe PVD in both legs.   Best contact #: [phone]

## 2020-06-17 NOTE — Telephone Encounter (Signed)
Ok for arterial study - I have ordered  Please let pt know she should hear soon

## 2020-06-18 NOTE — Telephone Encounter (Signed)
Patient notified and it has been scheduled.

## 2020-06-20 ENCOUNTER — Other Ambulatory Visit: Payer: Self-pay | Admitting: Internal Medicine

## 2020-06-20 DIAGNOSIS — F419 Anxiety disorder, unspecified: Secondary | ICD-10-CM

## 2020-06-29 ENCOUNTER — Other Ambulatory Visit: Payer: Self-pay

## 2020-06-29 ENCOUNTER — Ambulatory Visit (HOSPITAL_COMMUNITY)
Admission: RE | Admit: 2020-06-29 | Discharge: 2020-06-29 | Disposition: A | Discharge status: Home / Self Care | Payer: Medicare PPO | Source: Ambulatory Visit | Attending: Internal Medicine | Admitting: Internal Medicine

## 2020-06-29 DIAGNOSIS — I739 Peripheral vascular disease, unspecified: Secondary | ICD-10-CM

## 2020-06-30 ENCOUNTER — Encounter: Payer: Self-pay | Admitting: Internal Medicine

## 2020-07-05 ENCOUNTER — Other Ambulatory Visit: Payer: Self-pay | Admitting: Internal Medicine

## 2020-07-05 DIAGNOSIS — I10 Essential (primary) hypertension: Secondary | ICD-10-CM

## 2020-07-05 NOTE — Telephone Encounter (Signed)
Please refill as per office routine med refill policy (all routine meds refilled for 3 mo or monthly per pt preference up to one year from last visit, then month to month grace period for 3 mo, then further med refills will have to be denied)  

## 2020-07-20 ENCOUNTER — Encounter: Payer: Medicare PPO | Admitting: Obstetrics and Gynecology

## 2020-08-03 ENCOUNTER — Ambulatory Visit: Payer: Medicare PPO | Admitting: Nurse Practitioner

## 2020-08-05 ENCOUNTER — Other Ambulatory Visit: Payer: Self-pay

## 2020-08-05 ENCOUNTER — Encounter: Payer: Self-pay | Admitting: Nurse Practitioner

## 2020-08-05 ENCOUNTER — Other Ambulatory Visit (HOSPITAL_COMMUNITY)
Admission: RE | Admit: 2020-08-05 | Discharge: 2020-08-05 | Disposition: A | Discharge status: Home / Self Care | Payer: Medicare PPO | Source: Ambulatory Visit | Attending: Obstetrics and Gynecology | Admitting: Obstetrics and Gynecology

## 2020-08-05 ENCOUNTER — Ambulatory Visit (INDEPENDENT_AMBULATORY_CARE_PROVIDER_SITE_OTHER): Payer: Medicare PPO | Admitting: Nurse Practitioner

## 2020-08-05 VITALS — BP 126/82 | Ht 63.0 in | Wt 238.0 lb

## 2020-08-05 DIAGNOSIS — Z01419 Encounter for gynecological examination (general) (routine) without abnormal findings: Secondary | ICD-10-CM | POA: Insufficient documentation

## 2020-08-05 DIAGNOSIS — B369 Superficial mycosis, unspecified: Secondary | ICD-10-CM

## 2020-08-05 MED ORDER — NYSTATIN 100000 UNIT/GM EX POWD: 1.0000 "application " | Freq: Three times a day (TID) | CUTANEOUS | 2 refills | Status: AC

## 2020-08-05 NOTE — Progress Notes (Signed)
   Suzanne [name] 01/26/1956 [id number]   History:  65 y.o. [id number] presents for breast and pelvic exam. She complains of groin itching, redness, and bumps. Postmenopausal - no HRT, no bleeding. Normal pap and mammogram history. Pre-diabetes, HTN managed by PCP. She plans to work on weight loss and has started water aerobics.   Gynecologic History No LMP recorded. Patient is postmenopausal.   Contraception: post menopausal status  Health Maintenance Last Pap: 07/17/2019. Results were: normal Last mammogram: 03/2020. Results were: normal Last colonoscopy: 2017. Last Dexa: 09/2018. Results were: normal  Past medical history, past surgical history, family history and social history were all reviewed and documented in the EPIC chart.  ROS:  A ROS was performed and pertinent positives and negatives are included.  Exam:  Vitals:   08/05/20 1014  BP: 126/82  Weight: 238 lb (108 kg)  Height: 5\' 3"  (1.6 m)   Body mass index is 42.16 kg/m.  General appearance:  Normal Thyroid:  Symmetrical, normal in size, without palpable masses or nodularity. Respiratory  Auscultation:  Clear without wheezing or rhonchi Cardiovascular  Auscultation:  Regular rate, without rubs, murmurs or gallops  Edema/varicosities:  Not grossly evident Abdominal  Soft,nontender, without masses, guarding or rebound.  Liver/spleen:  No organomegaly noted  Hernia:  None appreciated  Skin  Inspection:  Redness, folliculitis Breasts: Examined lying and sitting.   Right: Without masses, retractions, nipple discharge or axillary adenopathy.   Left: Without masses, retractions, nipple discharge or axillary adenopathy. Gentitourinary   Inguinal/mons:  Normal without inguinal adenopathy  External genitalia:  Normal appearing vulva with no masses, tenderness, or lesions  BUS/Urethra/Skene's glands:  Normal  Vagina:  Normal appearing with normal color and discharge, no lesions  Cervix:  Normal appearing  without discharge or lesions  Uterus:  Normal in size, shape and contour.  Midline and mobile, nontender  Adnexa/parametria:     Rt: Normal in size, without masses or tenderness.   Lt: Normal in size, without masses or tenderness.  Anus and perineum: Normal  Digital rectal exam: Normal sphincter tone without palpated masses or tenderness  Assessment/Plan:  65 y.o. 69 for breast and pelvic exam.   Well female exam with routine gynecological exam - Plan: Cytology - PAP( [facility]). Education provided on SBEs, importance of preventative screenings, current guidelines, high calcium diet, regular exercise, and multivitamin daily. Labs with PCP.   Fungal infection of skin - Plan: nystatin (MYCOSTATIN/NYSTOP) powder applied to groin twice daily x 7 days then as needed.   Screening for cervical cancer - Normal Pap history. Discussed current guidelines and option to stop screening. She would like to continue and requests pap today. Pap with reflex collected.   Screening for breast cancer - Normal mammogram history.  Continue annual screenings.  Normal breast exam today.  Screening for colon cancer - 2017 colonoscopy. Will repeat at GI's recommended interval.   Screening for osteoporosis - Normal Dexa in 2020. Will repeat in 5 years per recommendation.   Return in 1 year for annual.    2021 DNP, 10:30 AM 08/05/2020

## 2020-08-05 NOTE — Patient Instructions (Addendum)
16:8 intermittent fasting  Health Maintenance After Age 67 After age 1, you are at a higher risk for certain long-term diseases and infections as well as injuries from falls. Falls are a major cause of broken bones and head injuries in people who are older than age 75. Getting regular preventive care can help to keep you healthy and well. Preventive care includes getting regular testing and making lifestyle changes as recommended by your health care provider. Talk with your health care provider about:  Which screenings and tests you should have. A screening is a test that checks for a disease when you have no symptoms.  A diet and exercise plan that is right for you. What should I know about screenings and tests to prevent falls? Screening and testing are the best ways to find a health problem early. Early diagnosis and treatment give you the best chance of managing medical conditions that are common after age 66. Certain conditions and lifestyle choices may make you more likely to have a fall. Your health care provider may recommend:  Regular vision checks. Poor vision and conditions such as cataracts can make you more likely to have a fall. If you wear glasses, make sure to get your prescription updated if your vision changes.  Medicine review. Work with your health care provider to regularly review all of the medicines you are taking, including over-the-counter medicines. Ask your health care provider about any side effects that may make you more likely to have a fall. Tell your health care provider if any medicines that you take make you feel dizzy or sleepy.  Osteoporosis screening. Osteoporosis is a condition that causes the bones to get weaker. This can make the bones weak and cause them to break more easily.  Blood pressure screening. Blood pressure changes and medicines to control blood pressure can make you feel dizzy.  Strength and balance checks. Your health care provider may  recommend certain tests to check your strength and balance while standing, walking, or changing positions.  Foot health exam. Foot pain and numbness, as well as not wearing proper footwear, can make you more likely to have a fall.  Depression screening. You may be more likely to have a fall if you have a fear of falling, feel emotionally low, or feel unable to do activities that you used to do.  Alcohol use screening. Using too much alcohol can affect your balance and may make you more likely to have a fall. What actions can I take to lower my risk of falls? General instructions  Talk with your health care provider about your risks for falling. Tell your health care provider if: ? You fall. Be sure to tell your health care provider about all falls, even ones that seem minor. ? You feel dizzy, sleepy, or off-balance.  Take over-the-counter and prescription medicines only as told by your health care provider. These include any supplements.  Eat a healthy diet and maintain a healthy weight. A healthy diet includes low-fat dairy products, low-fat (lean) meats, and fiber from whole grains, beans, and lots of fruits and vegetables. Home safety  Remove any tripping hazards, such as rugs, cords, and clutter.  Install safety equipment such as grab bars in bathrooms and safety rails on stairs.  Keep rooms and walkways well-lit. Activity  Follow a regular exercise program to stay fit. This will help you maintain your balance. Ask your health care provider what types of exercise are appropriate for you.  If you need  a cane or walker, use it as recommended by your health care provider.  Wear supportive shoes that have nonskid soles.   Lifestyle  Do not drink alcohol if your health care provider tells you not to drink.  If you drink alcohol, limit how much you have: ? 0-1 drink a day for women. ? 0-2 drinks a day for men.  Be aware of how much alcohol is in your drink. In the [place] one drink  equals one typical bottle of beer (12 oz), one-half glass of wine (5 oz), or one shot of hard liquor (1 oz).  Do not use any products that contain nicotine or tobacco, such as cigarettes and e-cigarettes. If you need help quitting, ask your health care provider. Summary  Having a healthy lifestyle and getting preventive care can help to protect your health and wellness after age 26.  Screening and testing are the best way to find a health problem early and help you avoid having a fall. Early diagnosis and treatment give you the best chance for managing medical conditions that are more common for people who are older than age 23.  Falls are a major cause of broken bones and head injuries in people who are older than age 23. Take precautions to prevent a fall at home.  Work with your health care provider to learn what changes you can make to improve your health and wellness and to prevent falls. This information is not intended to replace advice given to you by your health care provider. Make sure you discuss any questions you have with your health care provider. Document Revised: 08/01/2018 Document Reviewed: 02/21/2017 Elsevier Patient Education  2021 [employer].

## 2020-08-06 LAB — CYTOLOGY - PAP: Diagnosis: NEGATIVE

## 2020-09-30 ENCOUNTER — Encounter: Payer: Self-pay | Admitting: Internal Medicine

## 2020-09-30 ENCOUNTER — Other Ambulatory Visit: Payer: Self-pay

## 2020-09-30 ENCOUNTER — Ambulatory Visit: Payer: Medicare PPO | Admitting: Internal Medicine

## 2020-09-30 VITALS — BP 158/80 | HR 63 | Temp 98.9°F | Ht 63.0 in | Wt 238.0 lb

## 2020-09-30 DIAGNOSIS — F419 Anxiety disorder, unspecified: Secondary | ICD-10-CM

## 2020-09-30 DIAGNOSIS — E538 Deficiency of other specified B group vitamins: Secondary | ICD-10-CM | POA: Diagnosis not present

## 2020-09-30 DIAGNOSIS — K219 Gastro-esophageal reflux disease without esophagitis: Secondary | ICD-10-CM | POA: Diagnosis not present

## 2020-09-30 DIAGNOSIS — I10 Essential (primary) hypertension: Secondary | ICD-10-CM | POA: Diagnosis not present

## 2020-09-30 DIAGNOSIS — R7303 Prediabetes: Secondary | ICD-10-CM

## 2020-09-30 DIAGNOSIS — E559 Vitamin D deficiency, unspecified: Secondary | ICD-10-CM | POA: Diagnosis not present

## 2020-09-30 DIAGNOSIS — Z0001 Encounter for general adult medical examination with abnormal findings: Secondary | ICD-10-CM

## 2020-09-30 LAB — BASIC METABOLIC PANEL
BUN: 14 mg/dL (ref 6–23)
CO2: 29 mEq/L (ref 19–32)
Calcium: 9.1 mg/dL (ref 8.4–10.5)
Chloride: 104 mEq/L (ref 96–112)
Creatinine, Ser: 0.78 mg/dL (ref 0.40–1.20)
GFR: 78.87 mL/min (ref >60.00)
Glucose, Bld: 97 mg/dL (ref 70–99)
Potassium: 4.1 mEq/L (ref 3.5–5.1)
Sodium: 142 mEq/L (ref 135–145)

## 2020-09-30 LAB — VITAMIN D 25 HYDROXY (VIT D DEFICIENCY, FRACTURES): VITD: 35.68 ng/mL (ref 30.00–100.00)

## 2020-09-30 LAB — MICROALBUMIN / CREATININE URINE RATIO
Creatinine,U: 196.9 mg/dL
Microalb Creat Ratio: 0.7 mg/g (ref 0.0–30.0)
Microalb, Ur: 1.4 mg/dL (ref 0.0–1.9)

## 2020-09-30 LAB — URINALYSIS, ROUTINE W REFLEX MICROSCOPIC
Bilirubin Urine: NEGATIVE
Hgb urine dipstick: NEGATIVE
Ketones, ur: NEGATIVE
Leukocytes,Ua: NEGATIVE
Nitrite: NEGATIVE
Specific Gravity, Urine: >=1.03 — AB (ref 1.000–1.030)
Total Protein, Urine: NEGATIVE
Urine Glucose: NEGATIVE
Urobilinogen, UA: 1 (ref 0.0–1.0)
pH: 5.5 (ref 5.0–8.0)

## 2020-09-30 LAB — VITAMIN B12: Vitamin B-12: 394 pg/mL (ref 211–911)

## 2020-09-30 LAB — CBC WITH DIFFERENTIAL/PLATELET
Basophils Absolute: 0.1 10*3/uL (ref 0.0–0.1)
Basophils Relative: 1.2 % (ref 0.0–3.0)
Eosinophils Absolute: 0.1 10*3/uL (ref 0.0–0.7)
Eosinophils Relative: 2.2 % (ref 0.0–5.0)
HCT: 37.7 % (ref 36.0–46.0)
Hemoglobin: 12.4 g/dL (ref 12.0–15.0)
Lymphocytes Relative: 23.8 % (ref 12.0–46.0)
Lymphs Abs: 1.2 10*3/uL (ref 0.7–4.0)
MCHC: 32.9 g/dL (ref 30.0–36.0)
MCV: 87.4 fl (ref 78.0–100.0)
Monocytes Absolute: 0.4 10*3/uL (ref 0.1–1.0)
Monocytes Relative: 7.6 % (ref 3.0–12.0)
Neutro Abs: 3.3 10*3/uL (ref 1.4–7.7)
Neutrophils Relative %: 65.2 % (ref 43.0–77.0)
Platelets: 304 10*3/uL (ref 150.0–400.0)
RBC: 4.31 Mil/uL (ref 3.87–5.11)
RDW: 15.4 % (ref 11.5–15.5)
WBC: 5.1 10*3/uL (ref 4.0–10.5)

## 2020-09-30 LAB — HEPATIC FUNCTION PANEL
ALT: 12 U/L (ref 0–35)
AST: 20 U/L (ref 0–37)
Albumin: 3.9 g/dL (ref 3.5–5.2)
Alkaline Phosphatase: 96 U/L (ref 39–117)
Bilirubin, Direct: 0.1 mg/dL (ref 0.0–0.3)
Total Bilirubin: 0.3 mg/dL (ref 0.2–1.2)
Total Protein: 7.5 g/dL (ref 6.0–8.3)

## 2020-09-30 LAB — TSH: TSH: 1.24 u[IU]/mL (ref 0.35–4.50)

## 2020-09-30 LAB — LIPID PANEL
Cholesterol: 152 mg/dL (ref 0–200)
HDL: 40.8 mg/dL (ref >39.00)
LDL Cholesterol: 98 mg/dL (ref 0–99)
NonHDL: 111.54
Total CHOL/HDL Ratio: 4
Triglycerides: 68 mg/dL (ref 0.0–149.0)
VLDL: 13.6 mg/dL (ref 0.0–40.0)

## 2020-09-30 LAB — HEMOGLOBIN A1C: Hgb A1c MFr Bld: 6.5 % (ref 4.6–6.5)

## 2020-09-30 NOTE — Patient Instructions (Signed)

## 2020-09-30 NOTE — Progress Notes (Signed)
Patient ID: Suzanne [name], female   DOB: 07/25/1955, 65 y.o.   MRN: [id number]         Chief Complaint:: wellness exam and Follow-up uncontrolled htn, gerd, hyperglycemia, and anxiety       HPI:  Suzanne [name] is a 65 y.o. female here for wellness exam; declines singrix and covid booster #2 for now; o/w up to date with preventive referrals and immunizations                        Also mother died 1 yr ago at 74, still feels some sad with this; BP at home has been < 140/90; Pt denies chest pain, increased sob or doe, wheezing, orthopnea, PND, increased LE swelling, palpitations, dizziness or syncope.   Pt denies polydipsia, polyuria, or new focal neuro s/s.   Pt denies fever, wt loss, night sweats, loss of appetite, or other constitutional symptoms  No other new complaints.  Denies worsening reflux, abd pain, dysphagia, n/v, bowel change or blood. Denies worsening depressive symptoms, suicidal ideation, or panic; has ongoing anxiety   Wt Readings from Last 3 Encounters:  09/30/20 238 lb (108 kg)  08/05/20 238 lb (108 kg)  04/01/20 237 lb (107.5 kg)   BP Readings from Last 3 Encounters:  09/30/20 (!) 158/80  08/05/20 126/82  04/01/20 130/80   Immunization History  Administered Date(s) Administered   Fluad Quad(high Dose 65+) 12/31/2018   Influenza Whole 01/19/2010   Influenza,inj,Quad PF,6+ Mos 01/08/2018   Influenza-Unspecified 01/28/2020   PFIZER(Purple Top)SARS-COV-2 Vaccination 05/31/2019, 06/21/2019, 01/28/2020   Pneumococcal Conjugate-13 04/01/2020   Pneumococcal Polysaccharide-23 08/23/2015, 12/31/2018   Tdap 06/26/2011   There are no preventive care reminders to display for this patient.     Past Medical History:  Diagnosis Date   Anxiety    ASCUS of cervix with negative high risk HPV 06/2018   Chronic headaches    Depression    Diabetes mellitus, type 2 (HCC)    Diverticulosis    Gallstones    GERD    Hepatitis B    HYPERTENSION    OBESITY     RAYNAUDS SYNDROME    Seizures (HCC)    age 21 only one time   Stammering    Past Surgical History:  Procedure Laterality Date   CHOLECYSTECTOMY  2000   COLONOSCOPY     OTHER SURGICAL HISTORY  2000   ? if for diverticulosis, pt states they went through beely button    reports that she has never smoked. She has never used smokeless tobacco. She reports that she does not drink alcohol and does not use drugs. family history includes Allergies in her brother and son; Clotting disorder in her maternal aunt; Colon cancer in her maternal aunt; Diabetes in her maternal aunt and paternal aunt; Heart disease in her paternal uncle; Liver disease in her brother; Lung cancer in her father and maternal aunt; Pancreatic cancer in her maternal uncle. Allergies  Allergen Reactions   Other Swelling    ALL SEAFOOD   Shrimp [Shellfish Allergy] Swelling    Pt states she allergic to seafood   Current Outpatient Medications on File Prior to Visit  Medication Sig Dispense Refill   clotrimazole-betamethasone (LOTRISONE) cream Use as directed twice per day as needed 45 g 1   metoprolol succinate (TOPROL-XL) 100 MG 24 hr tablet TAKE 1 TABLET BY MOUTH ONCE DAILY WITH MEALS OR IMMEDIATELY FOLLOWING 90 tablet 2   Misc. Devices (HIBICLENS HAND  PUMP 32OZ) MISC 1 Pump by Does not apply route daily as needed (to boils). 1 each 3   mupirocin nasal ointment (BACTROBAN) 2 % Place 1 application into the nose 2 (two) times daily. Use one-half of tube in each nostril twice daily for five (5) days. After application, press sides of nose together and gently massage. 10 g 0   nystatin (MYCOSTATIN/NYSTOP) powder Apply 1 application topically 3 (three) times daily. 15 g 2   venlafaxine XR (EFFEXOR-XR) 150 MG 24 hr capsule TAKE 1 CAPSULE BY MOUTH ONCE DAILY WITH BREAKFAST 90 capsule 2   atorvastatin (LIPITOR) 10 MG tablet Take 1 tablet (10 mg total) by mouth daily. (Patient not taking: No sig reported) 90 tablet 3   metFORMIN  (GLUCOPHAGE) 500 MG tablet Take 1 tablet (500 mg total) by mouth daily with breakfast. (Patient not taking: No sig reported) 90 tablet 3   No current facility-administered medications on file prior to visit.        ROS:  All others reviewed and negative.  Objective        PE:  BP (!) 158/80 (BP Location: Right Arm, Patient Position: Sitting, Cuff Size: Large)   Pulse 63   Temp 98.9 F (37.2 C) (Oral)   Ht 5\' 3"  (1.6 m)   Wt 238 lb (108 kg)   SpO2 100%   BMI 42.16 kg/m                 Constitutional: Pt appears in NAD               HENT: Head: NCAT.                Right Ear: External ear normal.                 Left Ear: External ear normal.                Eyes: . Pupils are equal, round, and reactive to light. Conjunctivae and EOM are normal               Nose: without d/c or deformity               Neck: Neck supple. Gross normal ROM               Cardiovascular: Normal rate and regular rhythm.                 Pulmonary/Chest: Effort normal and breath sounds without rales or wheezing.                Abd:  Soft, NT, ND, + BS, no organomegaly               Neurological: Pt is alert. At baseline orientation, motor grossly intact               Skin: Skin is warm. No rashes, no other new lesions, LE edema - none               Psychiatric: Pt behavior is normal without agitation   Micro: none  Cardiac tracings I have personally interpreted today:  none  Pertinent Radiological findings (summarize): none   Lab Results  Component Value Date   WBC 5.1 09/30/2020   HGB 12.4 09/30/2020   HCT 37.7 09/30/2020   PLT 304.0 09/30/2020   GLUCOSE 97 09/30/2020   CHOL 152 09/30/2020   TRIG 68.0 09/30/2020   HDL 40.80 09/30/2020   LDLCALC 98  09/30/2020   ALT 12 09/30/2020   AST 20 09/30/2020   NA 142 09/30/2020   K 4.1 09/30/2020   CL 104 09/30/2020   CREATININE 0.78 09/30/2020   BUN 14 09/30/2020   CO2 29 09/30/2020   TSH 1.24 09/30/2020   HGBA1C 6.5 09/30/2020   MICROALBUR  1.4 09/30/2020   Assessment/Plan:  Suzanne [name] is a 65 y.o. Black or African American [2] female with  has a past medical history of Anxiety, ASCUS of cervix with negative high risk HPV (06/2018), Chronic headaches, Depression, Diabetes mellitus, type 2 (HCC), Diverticulosis, Gallstones, GERD, Hepatitis B, HYPERTENSION, OBESITY, RAYNAUDS SYNDROME, Seizures (HCC), and Stammering.  Encounter for well adult exam with abnormal findings Age and sex appropriate education and counseling updated with regular exercise and diet Referrals for preventative services - none needed Immunizations addressed - declines shignrix and covid booster, Smoking counseling  - none needed Evidence for depression or other mood disorder - grieving mild sad but declines need for other tx or counseling Most recent labs reviewed. I have personally reviewed and have noted: 1) the patient's medical and social history 2) The patient's current medications and supplements 3) The patient's height, weight, and BMI have been recorded in the chart   Hypertension, uncontrolled BP Readings from Last 3 Encounters:  09/30/20 (!) 158/80  08/05/20 126/82  04/01/20 130/80   Uncontrolled today, pt declines med change for now, will continue to monitor at home and pt to continue medical treatment toprol   Pre-diabetes Lab Results  Component Value Date   HGBA1C 6.5 09/30/2020   Stable, pt to continue current medical treatment metformin   GERD Stable, continue anti-reflux diet  Anxiety Stable, overall doig ok, declines need for further tx or counseling  Followup: Return in about 6 months (around 04/01/2021).  Oliver Barre, MD 10/03/2020 7:40 PM [facility]

## 2020-10-01 ENCOUNTER — Encounter: Payer: Self-pay | Admitting: Internal Medicine

## 2020-10-03 ENCOUNTER — Encounter: Payer: Self-pay | Admitting: Internal Medicine

## 2020-10-03 NOTE — Assessment & Plan Note (Signed)
Stable, overall doig ok, declines need for further tx or counseling

## 2020-10-03 NOTE — Assessment & Plan Note (Signed)
Stable, continue anti-reflux diet

## 2020-10-03 NOTE — Assessment & Plan Note (Signed)
Age and sex appropriate education and counseling updated with regular exercise and diet Referrals for preventative services - none needed Immunizations addressed - declines shignrix and covid booster, Smoking counseling  - none needed Evidence for depression or other mood disorder - grieving mild sad but declines need for other tx or counseling Most recent labs reviewed. I have personally reviewed and have noted: 1) the patient's medical and social history 2) The patient's current medications and supplements 3) The patient's height, weight, and BMI have been recorded in the chart

## 2020-10-03 NOTE — Assessment & Plan Note (Signed)
Lab Results  °Component Value Date  ° HGBA1C 6.5 09/30/2020  ° °Stable, pt to continue current medical treatment metformin ° °

## 2020-10-03 NOTE — Assessment & Plan Note (Addendum)
BP Readings from Last 3 Encounters:  09/30/20 (!) 158/80  08/05/20 126/82  04/01/20 130/80   Uncontrolled today, pt declines med change for now, will continue to monitor at home and pt to continue medical treatment toprol

## 2020-11-10 ENCOUNTER — Telehealth: Payer: Self-pay | Admitting: Internal Medicine

## 2020-11-10 MED ORDER — HYDROCODONE BIT-HOMATROP MBR 5-1.5 MG/5ML PO SOLN: 5.0000 mL | Freq: Four times a day (QID) | ORAL | 0 refills | Status: AC | PRN

## 2020-11-10 MED ORDER — NIRMATRELVIR/RITONAVIR (PAXLOVID)TABLET: 3.0000 | ORAL_TABLET | Freq: Two times a day (BID) | ORAL | 0 refills | Status: AC

## 2020-11-10 NOTE — Telephone Encounter (Signed)
POS covid 7/10 Patient seeking advice for cough, headache

## 2020-11-10 NOTE — Telephone Encounter (Signed)
Ok for paxlovid and hycodan prn - done erx to [facility]

## 2021-03-14 ENCOUNTER — Other Ambulatory Visit: Payer: Self-pay | Admitting: Internal Medicine

## 2021-03-14 DIAGNOSIS — F419 Anxiety disorder, unspecified: Secondary | ICD-10-CM

## 2021-03-23 DIAGNOSIS — H5213 Myopia, bilateral: Secondary | ICD-10-CM | POA: Diagnosis not present

## 2021-03-23 DIAGNOSIS — H40033 Anatomical narrow angle, bilateral: Secondary | ICD-10-CM | POA: Diagnosis not present

## 2021-03-23 DIAGNOSIS — H52209 Unspecified astigmatism, unspecified eye: Secondary | ICD-10-CM | POA: Diagnosis not present

## 2021-03-23 DIAGNOSIS — H524 Presbyopia: Secondary | ICD-10-CM | POA: Diagnosis not present

## 2021-03-23 DIAGNOSIS — H2513 Age-related nuclear cataract, bilateral: Secondary | ICD-10-CM | POA: Diagnosis not present

## 2021-03-30 ENCOUNTER — Telehealth: Payer: Self-pay | Admitting: Internal Medicine

## 2021-03-30 DIAGNOSIS — I10 Essential (primary) hypertension: Secondary | ICD-10-CM

## 2021-03-30 MED ORDER — METOPROLOL SUCCINATE ER 100 MG PO TB24: ORAL_TABLET | ORAL | 0 refills | Status: DC

## 2021-03-30 NOTE — Telephone Encounter (Signed)
1.Medication Requested: metoprolol succinate (TOPROL-XL) 100 MG 24 hr tablet  2. Pharmacy (Name, Street, [place]): [id number] - [place] ([place]), [place] - [address]  3. On Med List: yes   4. Last Visit with PCP: 09-30-2020  5. Next visit date with PCP: 04-07-2021  Patient states she is out of medication

## 2021-04-07 ENCOUNTER — Ambulatory Visit (INDEPENDENT_AMBULATORY_CARE_PROVIDER_SITE_OTHER): Payer: Medicare HMO | Admitting: Internal Medicine

## 2021-04-07 ENCOUNTER — Encounter: Payer: Self-pay | Admitting: Internal Medicine

## 2021-04-07 ENCOUNTER — Other Ambulatory Visit: Payer: Self-pay

## 2021-04-07 VITALS — BP 148/80 | HR 67 | Temp 98.6°F | Ht 63.0 in | Wt 240.0 lb

## 2021-04-07 DIAGNOSIS — E538 Deficiency of other specified B group vitamins: Secondary | ICD-10-CM | POA: Diagnosis not present

## 2021-04-07 DIAGNOSIS — H6122 Impacted cerumen, left ear: Secondary | ICD-10-CM | POA: Diagnosis not present

## 2021-04-07 DIAGNOSIS — E559 Vitamin D deficiency, unspecified: Secondary | ICD-10-CM | POA: Diagnosis not present

## 2021-04-07 DIAGNOSIS — I10 Essential (primary) hypertension: Secondary | ICD-10-CM

## 2021-04-07 DIAGNOSIS — H9202 Otalgia, left ear: Secondary | ICD-10-CM

## 2021-04-07 DIAGNOSIS — R7303 Prediabetes: Secondary | ICD-10-CM | POA: Diagnosis not present

## 2021-04-07 NOTE — Patient Instructions (Signed)
Your left ear was irrigated of wax today  Please monitor your BP at home on a regular basis, with the goal being to be less than 140/90 at least (and less than 130/80 even better)  Please take OTC Vitamin D3 at 2000 units per day, indefinitely, or 4000 units if you already take the 2000 units.    Please continue all other medications as before, and refills have been done if requested.  Please have the pharmacy call with any other refills you may need.  Please continue your efforts at being more active, low cholesterol diet, and weight control.  Please keep your appointments with your specialists as you may have planned  Please make an Appointment to return in 6 months, or sooner if needed, also with Lab Appointment for testing done 3-5 days before at the FIRST FLOOR Lab (so this is for TWO appointments - please see the scheduling desk as you leave)  Due to the ongoing Covid 19 pandemic, our lab now requires an appointment for any labs done at our office.  If you need labs done and do not have an appointment, please call our office ahead of time to schedule before presenting to the lab for your testing.

## 2021-04-07 NOTE — Progress Notes (Signed)
Patient ID: Suzanne [name], female   DOB: 28-May-1955, 65 y.o.   MRN: [id number]        Chief Complaint: follow up HTN, low vit d, and left ear bleeding       HPI:  Suzanne [name] is a 65 y.o. female here with c/o recent episode x 1 of painless mild bleeding from the left ear, had slight discomfort but no pain or blood since then, without HA, fever, other d/c, sinus symptoms, tinnitus or vertigo or hearing loss.  BP has been < 140/90 at home.  Not taking Vit D.  Pt denies chest pain, increased sob or doe, wheezing, orthopnea, PND, increased LE swelling, palpitations, dizziness or syncope.   Pt denies polydipsia, polyuria, or new focal neuro s/s.   Pt denies fever, wt loss, night sweats, loss of appetite, or other constitutional symptoms  No other new complaints        Wt Readings from Last 3 Encounters:  04/07/21 240 lb (108.9 kg)  09/30/20 238 lb (108 kg)  08/05/20 238 lb (108 kg)   BP Readings from Last 3 Encounters:  04/07/21 (!) 148/80  09/30/20 (!) 158/80  08/05/20 126/82         Past Medical History:  Diagnosis Date   Anxiety    ASCUS of cervix with negative high risk HPV 06/2018   Chronic headaches    Depression    Diabetes mellitus, type 2 (HCC)    Diverticulosis    Gallstones    GERD    Hepatitis B    HYPERTENSION    OBESITY    RAYNAUDS SYNDROME    Seizures (HCC)    age 89 only one time   Stammering    Past Surgical History:  Procedure Laterality Date   CHOLECYSTECTOMY  2000   COLONOSCOPY     OTHER SURGICAL HISTORY  2000   ? if for diverticulosis, pt states they went through beely button    reports that she has never smoked. She has never used smokeless tobacco. She reports that she does not drink alcohol and does not use drugs. family history includes Allergies in her brother and son; Clotting disorder in her maternal aunt; Colon cancer in her maternal aunt; Diabetes in her maternal aunt and paternal aunt; Heart disease in her paternal uncle;  Liver disease in her brother; Lung cancer in her father and maternal aunt; Pancreatic cancer in her maternal uncle. Allergies  Allergen Reactions   Other Swelling    ALL SEAFOOD   Shrimp [Shellfish Allergy] Swelling    Pt states she allergic to seafood   Current Outpatient Medications on File Prior to Visit  Medication Sig Dispense Refill   clotrimazole-betamethasone (LOTRISONE) cream Use as directed twice per day as needed 45 g 1   metFORMIN (GLUCOPHAGE) 500 MG tablet Take 1 tablet (500 mg total) by mouth daily with breakfast. 90 tablet 3   metoprolol succinate (TOPROL-XL) 100 MG 24 hr tablet Take with or immediately following a meal. 30 tablet 0   nystatin (MYCOSTATIN/NYSTOP) powder Apply 1 application topically 3 (three) times daily. 15 g 2   venlafaxine XR (EFFEXOR-XR) 150 MG 24 hr capsule TAKE 1 CAPSULE BY MOUTH ONCE DAILY WITH BREAKFAST 90 capsule 1   atorvastatin (LIPITOR) 10 MG tablet Take 1 tablet (10 mg total) by mouth daily. (Patient not taking: Reported on 08/05/2020) 90 tablet 3   Misc. Devices (HIBICLENS HAND PUMP 32OZ) MISC 1 Pump by Does not apply route daily as needed (to  boils). (Patient not taking: Reported on 04/07/2021) 1 each 3   mupirocin nasal ointment (BACTROBAN) 2 % Place 1 application into the nose 2 (two) times daily. Use one-half of tube in each nostril twice daily for five (5) days. After application, press sides of nose together and gently massage. (Patient not taking: Reported on 04/07/2021) 10 g 0   No current facility-administered medications on file prior to visit.        ROS:  All others reviewed and negative.  Objective        PE:  BP (!) 148/80 (BP Location: Right Arm, Patient Position: Sitting, Cuff Size: Large)    Pulse 67    Temp 98.6 F (37 C) (Oral)    Ht 5\' 3"  (1.6 m)    Wt 240 lb (108.9 kg)    SpO2 99%    BMI 42.51 kg/m                 Constitutional: Pt appears in NAD               HENT: Head: NCAT.                Right Ear: External ear  normal.                 Left Ear: External ear normal. Left canal without red, tender, swelling, d/c or bleeding after wax impaction resolved with irrigation               Eyes: . Pupils are equal, round, and reactive to light. Conjunctivae and EOM are normal               Nose: without d/c or deformity               Neck: Neck supple. Gross normal ROM               Cardiovascular: Normal rate and regular rhythm.                 Pulmonary/Chest: Effort normal and breath sounds without rales or wheezing.                Abd:  Soft, NT, ND, + BS, no organomegaly               Neurological: Pt is alert. At baseline orientation, motor grossly intact               Skin: Skin is warm. No rashes, no other new lesions, LE edema - none               Psychiatric: Pt behavior is normal without agitation   Micro: none  Cardiac tracings I have personally interpreted today:  none  Pertinent Radiological findings (summarize): none   Lab Results  Component Value Date   WBC 5.1 09/30/2020   HGB 12.4 09/30/2020   HCT 37.7 09/30/2020   PLT 304.0 09/30/2020   GLUCOSE 97 09/30/2020   CHOL 152 09/30/2020   TRIG 68.0 09/30/2020   HDL 40.80 09/30/2020   LDLCALC 98 09/30/2020   ALT 12 09/30/2020   AST 20 09/30/2020   NA 142 09/30/2020   K 4.1 09/30/2020   CL 104 09/30/2020   CREATININE 0.78 09/30/2020   BUN 14 09/30/2020   CO2 29 09/30/2020   TSH 1.24 09/30/2020   HGBA1C 6.5 09/30/2020   MICROALBUR 1.4 09/30/2020   Assessment/Plan:  Suzanne [name] is a 66 y.o. Black or African American [2]  female with  has a past medical history of Anxiety, ASCUS of cervix with negative high risk HPV (06/2018), Chronic headaches, Depression, Diabetes mellitus, type 2 (HCC), Diverticulosis, Gallstones, GERD, Hepatitis B, HYPERTENSION, OBESITY, RAYNAUDS SYNDROME, Seizures (HCC), and Stammering.  Hypertension, uncontrolled BP Readings from Last 3 Encounters:  04/07/21 (!) 148/80  09/30/20 (!) 158/80   08/05/20 126/82   Uncontrolled here, pt states ok at home, pt to continue medical treatment toprol as declines change   Pre-diabetes Lab Results  Component Value Date   HGBA1C 6.5 09/30/2020   Stable, pt to continue current medical treatment metformin   Vitamin D deficiency Last vitamin D Lab Results  Component Value Date   VD25OH 35.68 09/30/2020   Low, to start oral replacement   Left ear pain With benign exam today except for wax impaction,   .Ceruminosis is noted.  Wax is removed by syringing and manual debridement. Instructions for home care to prevent wax buildup are given , no other specific tx needed, to cont to monitor  Followup: Return in about 6 months (around 10/06/2021).  Oliver Barre, MD 04/10/2021 8:19 PM [facility]

## 2021-04-10 ENCOUNTER — Encounter: Payer: Self-pay | Admitting: Internal Medicine

## 2021-04-10 DIAGNOSIS — H9202 Otalgia, left ear: Secondary | ICD-10-CM | POA: Insufficient documentation

## 2021-04-10 DIAGNOSIS — H6091 Unspecified otitis externa, right ear: Secondary | ICD-10-CM | POA: Insufficient documentation

## 2021-04-10 DIAGNOSIS — E559 Vitamin D deficiency, unspecified: Secondary | ICD-10-CM | POA: Insufficient documentation

## 2021-04-10 NOTE — Assessment & Plan Note (Addendum)
With benign exam today except for wax impaction,   .Ceruminosis is noted.  Wax is removed by syringing and manual debridement. Instructions for home care to prevent wax buildup are given , no other specific tx needed, to cont to monitor

## 2021-04-10 NOTE — Assessment & Plan Note (Signed)
BP Readings from Last 3 Encounters:  04/07/21 (!) 148/80  09/30/20 (!) 158/80  08/05/20 126/82   Uncontrolled here, pt states ok at home, pt to continue medical treatment toprol as declines change

## 2021-04-10 NOTE — Assessment & Plan Note (Signed)
Last vitamin D Lab Results  Component Value Date   VD25OH 35.68 09/30/2020   Low, to start oral replacement

## 2021-04-10 NOTE — Assessment & Plan Note (Signed)
Lab Results  Component Value Date   HGBA1C 6.5 09/30/2020   Stable, pt to continue current medical treatment metformin

## 2021-05-02 DIAGNOSIS — H2513 Age-related nuclear cataract, bilateral: Secondary | ICD-10-CM | POA: Diagnosis not present

## 2021-05-02 DIAGNOSIS — H43823 Vitreomacular adhesion, bilateral: Secondary | ICD-10-CM | POA: Diagnosis not present

## 2021-05-02 DIAGNOSIS — H353132 Nonexudative age-related macular degeneration, bilateral, intermediate dry stage: Secondary | ICD-10-CM | POA: Diagnosis not present

## 2021-05-02 DIAGNOSIS — H43813 Vitreous degeneration, bilateral: Secondary | ICD-10-CM | POA: Diagnosis not present

## 2021-05-07 ENCOUNTER — Other Ambulatory Visit: Payer: Self-pay | Admitting: Internal Medicine

## 2021-05-07 DIAGNOSIS — I10 Essential (primary) hypertension: Secondary | ICD-10-CM

## 2021-05-07 NOTE — Telephone Encounter (Signed)
Please refill as per office routine med refill policy (all routine meds to be refilled for 3 mo or monthly (per pt preference) up to one year from last visit, then month to month grace period for 3 mo, then further med refills will have to be denied) ? ?

## 2021-05-17 DIAGNOSIS — Z6841 Body Mass Index (BMI) 40.0 and over, adult: Secondary | ICD-10-CM | POA: Diagnosis not present

## 2021-05-17 DIAGNOSIS — F419 Anxiety disorder, unspecified: Secondary | ICD-10-CM | POA: Diagnosis not present

## 2021-05-17 DIAGNOSIS — Z809 Family history of malignant neoplasm, unspecified: Secondary | ICD-10-CM | POA: Diagnosis not present

## 2021-05-17 DIAGNOSIS — F324 Major depressive disorder, single episode, in partial remission: Secondary | ICD-10-CM | POA: Diagnosis not present

## 2021-05-17 DIAGNOSIS — Z7722 Contact with and (suspected) exposure to environmental tobacco smoke (acute) (chronic): Secondary | ICD-10-CM | POA: Diagnosis not present

## 2021-05-17 DIAGNOSIS — I10 Essential (primary) hypertension: Secondary | ICD-10-CM | POA: Diagnosis not present

## 2021-05-17 DIAGNOSIS — Z91013 Allergy to seafood: Secondary | ICD-10-CM | POA: Diagnosis not present

## 2021-05-18 ENCOUNTER — Other Ambulatory Visit: Payer: Self-pay | Admitting: Internal Medicine

## 2021-05-18 DIAGNOSIS — Z1231 Encounter for screening mammogram for malignant neoplasm of breast: Secondary | ICD-10-CM

## 2021-06-07 ENCOUNTER — Ambulatory Visit
Admission: RE | Admit: 2021-06-07 | Discharge: 2021-06-07 | Disposition: A | Discharge status: Home / Self Care | Payer: Medicare PPO | Source: Ambulatory Visit | Attending: Internal Medicine | Admitting: Internal Medicine

## 2021-06-07 DIAGNOSIS — Z1231 Encounter for screening mammogram for malignant neoplasm of breast: Secondary | ICD-10-CM | POA: Diagnosis not present

## 2021-07-22 ENCOUNTER — Encounter: Payer: Self-pay | Admitting: Internal Medicine

## 2021-07-22 ENCOUNTER — Ambulatory Visit: Payer: Medicare PPO | Admitting: Internal Medicine

## 2021-07-22 VITALS — BP 138/72 | HR 76 | Temp 98.1°F | Ht 63.0 in | Wt 242.0 lb

## 2021-07-22 DIAGNOSIS — E559 Vitamin D deficiency, unspecified: Secondary | ICD-10-CM | POA: Diagnosis not present

## 2021-07-22 DIAGNOSIS — E538 Deficiency of other specified B group vitamins: Secondary | ICD-10-CM | POA: Diagnosis not present

## 2021-07-22 DIAGNOSIS — Z0001 Encounter for general adult medical examination with abnormal findings: Secondary | ICD-10-CM

## 2021-07-22 DIAGNOSIS — I10 Essential (primary) hypertension: Secondary | ICD-10-CM | POA: Diagnosis not present

## 2021-07-22 DIAGNOSIS — R202 Paresthesia of skin: Secondary | ICD-10-CM

## 2021-07-22 DIAGNOSIS — R7303 Prediabetes: Secondary | ICD-10-CM | POA: Diagnosis not present

## 2021-07-22 MED ORDER — AMITRIPTYLINE HCL 50 MG PO TABS: 50.0000 mg | ORAL_TABLET | Freq: Every day | ORAL | 1 refills | Status: DC

## 2021-07-22 NOTE — Patient Instructions (Signed)
Please take all new medication as prescribed - the medication for the legs at bedtime ? ?Please call for higher dose if you feel it is needed ? ?Please continue all other medications as before, and refills have been done if requested. ? ?Please have the pharmacy call with any other refills you may need. ? ?Please continue your efforts at being more active, low cholesterol diet, and weight control. ? ?You are otherwise up to date with prevention measures today. ? ?Please keep your appointments with your specialists as you may have planned ? ?Please go to the LAB at the blood drawing area for the tests to be done - at the [facility] lab at your convenience ? ?You will be contacted by phone if any changes need to be made immediately.  Otherwise, you will receive a letter about your results with an explanation, but please check with MyChart first. ? ?Please remember to sign up for MyChart if you have not done so, as this will be important to you in the future with finding out test results, communicating by private email, and scheduling acute appointments online when needed. ? ?Please make an Appointment to return in 6 months, or sooner if needed ?

## 2021-07-22 NOTE — Assessment & Plan Note (Signed)
Last vitamin D °Lab Results  °Component Value Date  ° VD25OH 35.68 09/30/2020  ° °Low, to start oral replacement ° °
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
## 2021-07-23 NOTE — Assessment & Plan Note (Signed)
Lab Results  °Component Value Date  ° HGBA1C 6.5 09/30/2020  ° °Stable, pt to continue current medical treatment metformin ° °

## 2021-07-23 NOTE — Assessment & Plan Note (Signed)
With bedtime discomfort - for elavil 50 qhs trial,  to f/u any worsening symptoms or concerns   ?

## 2021-07-23 NOTE — Assessment & Plan Note (Signed)
BP Readings from Last 3 Encounters:  ?07/22/21 138/72  ?04/07/21 (!) 148/80  ?09/30/20 (!) 158/80  ? ?Stable, pt to continue medical treatment toprol ? ?

## 2021-07-23 NOTE — Assessment & Plan Note (Signed)
Age and sex appropriate education and counseling updated with regular exercise and diet Referrals for preventative services - none needed Immunizations addressed - declines covid booster, shingrix, tdap Smoking counseling  - none needed Evidence for depression or other mood disorder - none significant Most recent labs reviewed. I have personally reviewed and have noted: 1) the patient's medical and social history 2) The patient's current medications and supplements 3) The patient's height, weight, and BMI have been recorded in the chart  

## 2021-09-13 ENCOUNTER — Other Ambulatory Visit: Payer: Self-pay | Admitting: Internal Medicine

## 2021-09-13 DIAGNOSIS — F419 Anxiety disorder, unspecified: Secondary | ICD-10-CM

## 2021-10-06 ENCOUNTER — Other Ambulatory Visit (INDEPENDENT_AMBULATORY_CARE_PROVIDER_SITE_OTHER): Payer: Medicare PPO

## 2021-10-06 DIAGNOSIS — E538 Deficiency of other specified B group vitamins: Secondary | ICD-10-CM | POA: Diagnosis not present

## 2021-10-06 DIAGNOSIS — E559 Vitamin D deficiency, unspecified: Secondary | ICD-10-CM | POA: Diagnosis not present

## 2021-10-06 DIAGNOSIS — R7303 Prediabetes: Secondary | ICD-10-CM

## 2021-10-06 DIAGNOSIS — I10 Essential (primary) hypertension: Secondary | ICD-10-CM | POA: Diagnosis not present

## 2021-10-06 LAB — BASIC METABOLIC PANEL
BUN: 13 mg/dL (ref 6–23)
CO2: 29 mEq/L (ref 19–32)
Calcium: 9 mg/dL (ref 8.4–10.5)
Chloride: 106 mEq/L (ref 96–112)
Creatinine, Ser: 0.81 mg/dL (ref 0.40–1.20)
GFR: 74.84 mL/min (ref >60.00)
Glucose, Bld: 95 mg/dL (ref 70–99)
Potassium: 3.7 mEq/L (ref 3.5–5.1)
Sodium: 142 mEq/L (ref 135–145)

## 2021-10-06 LAB — HEPATIC FUNCTION PANEL
ALT: 15 U/L (ref 0–35)
AST: 18 U/L (ref 0–37)
Albumin: 3.8 g/dL (ref 3.5–5.2)
Alkaline Phosphatase: 91 U/L (ref 39–117)
Bilirubin, Direct: 0.1 mg/dL (ref 0.0–0.3)
Total Bilirubin: 0.4 mg/dL (ref 0.2–1.2)
Total Protein: 7.3 g/dL (ref 6.0–8.3)

## 2021-10-06 LAB — LIPID PANEL
Cholesterol: 152 mg/dL (ref 0–200)
HDL: 40.2 mg/dL (ref >39.00)
LDL Cholesterol: 97 mg/dL (ref 0–99)
NonHDL: 111.82
Total CHOL/HDL Ratio: 4
Triglycerides: 75 mg/dL (ref 0.0–149.0)
VLDL: 15 mg/dL (ref 0.0–40.0)

## 2021-10-06 LAB — URINALYSIS, ROUTINE W REFLEX MICROSCOPIC
Bilirubin Urine: NEGATIVE
Hgb urine dipstick: NEGATIVE
Ketones, ur: NEGATIVE
Leukocytes,Ua: NEGATIVE
Nitrite: NEGATIVE
RBC / HPF: NONE SEEN (ref 0–?)
Specific Gravity, Urine: >=1.03 — AB (ref 1.000–1.030)
Total Protein, Urine: NEGATIVE
Urine Glucose: NEGATIVE
Urobilinogen, UA: 1 (ref 0.0–1.0)
pH: 5.5 (ref 5.0–8.0)

## 2021-10-06 LAB — VITAMIN B12: Vitamin B-12: >1504 pg/mL — ABNORMAL HIGH (ref 211–911)

## 2021-10-06 LAB — TSH: TSH: 1.29 u[IU]/mL (ref 0.35–5.50)

## 2021-10-06 LAB — CBC WITH DIFFERENTIAL/PLATELET
Basophils Absolute: 0.1 10*3/uL (ref 0.0–0.1)
Basophils Relative: 1.3 % (ref 0.0–3.0)
Eosinophils Absolute: 0.1 10*3/uL (ref 0.0–0.7)
Eosinophils Relative: 2.4 % (ref 0.0–5.0)
HCT: 38 % (ref 36.0–46.0)
Hemoglobin: 12.4 g/dL (ref 12.0–15.0)
Lymphocytes Relative: 21 % (ref 12.0–46.0)
Lymphs Abs: 1 10*3/uL (ref 0.7–4.0)
MCHC: 32.6 g/dL (ref 30.0–36.0)
MCV: 87.9 fl (ref 78.0–100.0)
Monocytes Absolute: 0.3 10*3/uL (ref 0.1–1.0)
Monocytes Relative: 7 % (ref 3.0–12.0)
Neutro Abs: 3.3 10*3/uL (ref 1.4–7.7)
Neutrophils Relative %: 68.3 % (ref 43.0–77.0)
Platelets: 277 10*3/uL (ref 150.0–400.0)
RBC: 4.33 Mil/uL (ref 3.87–5.11)
RDW: 15.5 % (ref 11.5–15.5)
WBC: 4.8 10*3/uL (ref 4.0–10.5)

## 2021-10-06 LAB — VITAMIN D 25 HYDROXY (VIT D DEFICIENCY, FRACTURES): VITD: 42.14 ng/mL (ref 30.00–100.00)

## 2021-10-06 LAB — HEMOGLOBIN A1C: Hgb A1c MFr Bld: 6.3 % (ref 4.6–6.5)

## 2021-10-11 ENCOUNTER — Ambulatory Visit (INDEPENDENT_AMBULATORY_CARE_PROVIDER_SITE_OTHER): Payer: Medicare PPO | Admitting: Internal Medicine

## 2021-10-11 ENCOUNTER — Encounter: Payer: Self-pay | Admitting: Internal Medicine

## 2021-10-11 VITALS — BP 142/76 | HR 66 | Temp 98.5°F | Ht 63.0 in | Wt 234.2 lb

## 2021-10-11 DIAGNOSIS — E559 Vitamin D deficiency, unspecified: Secondary | ICD-10-CM | POA: Diagnosis not present

## 2021-10-11 DIAGNOSIS — E538 Deficiency of other specified B group vitamins: Secondary | ICD-10-CM | POA: Diagnosis not present

## 2021-10-11 DIAGNOSIS — I10 Essential (primary) hypertension: Secondary | ICD-10-CM | POA: Diagnosis not present

## 2021-10-11 DIAGNOSIS — F419 Anxiety disorder, unspecified: Secondary | ICD-10-CM | POA: Diagnosis not present

## 2021-10-11 DIAGNOSIS — R7303 Prediabetes: Secondary | ICD-10-CM | POA: Diagnosis not present

## 2021-10-11 MED ORDER — RYBELSUS 3 MG PO TABS: 3.0000 mg | ORAL_TABLET | Freq: Every day | ORAL | 3 refills | Status: DC

## 2021-10-11 NOTE — Progress Notes (Unsigned)
Patient ID: Suzanne [name], female   DOB: [birth year], 66 y.o.   MRN: [id number]        Chief Complaint: follow up HTN, HLD and hyperglycemia ***       HPI:  Suzanne [name] is a 66 y.o. female here with c/o         Wt Readings from Last 3 Encounters:  10/11/21 234 lb 3.2 oz (106.2 kg)  07/22/21 242 lb (109.8 kg)  04/07/21 240 lb (108.9 kg)   BP Readings from Last 3 Encounters:  10/11/21 (!) 142/76  07/22/21 138/72  04/07/21 (!) 148/80         Past Medical History:  Diagnosis Date   Anxiety    ASCUS of cervix with negative high risk HPV 06/2018   Chronic headaches    Depression    Diabetes mellitus, type 2 (HCC)    Diverticulosis    Gallstones    GERD    Hepatitis B    HYPERTENSION    OBESITY    RAYNAUDS SYNDROME    Seizures (HCC)    age 66 only one time   Stammering    Past Surgical History:  Procedure Laterality Date   CHOLECYSTECTOMY  2000   COLONOSCOPY     OTHER SURGICAL HISTORY  2000   ? if for diverticulosis, pt states they went through beely button    reports that she has never smoked. She has never used smokeless tobacco. She reports that she does not drink alcohol and does not use drugs. family history includes Allergies in her brother and son; Clotting disorder in her maternal aunt; Colon cancer in her maternal aunt; Diabetes in her maternal aunt and paternal aunt; Heart disease in her paternal uncle; Liver disease in her brother; Lung cancer in her father and maternal aunt; Pancreatic cancer in her maternal uncle. Allergies  Allergen Reactions   Other Swelling    ALL SEAFOOD   Shrimp [Shellfish Allergy] Swelling    Pt states she allergic to seafood   Current Outpatient Medications on File Prior to Visit  Medication Sig Dispense Refill   amitriptyline (ELAVIL) 50 MG tablet Take 1 tablet (50 mg total) by mouth at bedtime. 90 tablet 1   clotrimazole-betamethasone (LOTRISONE) cream Use as directed twice per day as needed 45 g 1    metFORMIN (GLUCOPHAGE) 500 MG tablet Take 1 tablet (500 mg total) by mouth daily with breakfast. 90 tablet 3   metoprolol succinate (TOPROL-XL) 100 MG 24 hr tablet TAKE 1 TABLET BY MOUTH WITH MEALS OR  IMMEDIATELY  FOLLOWING 90 tablet 2   nystatin (MYCOSTATIN/NYSTOP) powder Apply 1 application topically 3 (three) times daily. 15 g 2   venlafaxine XR (EFFEXOR-XR) 150 MG 24 hr capsule TAKE 1 CAPSULE BY MOUTH ONCE DAILY WITH BREAKFAST 90 capsule 3   atorvastatin (LIPITOR) 10 MG tablet Take 1 tablet (10 mg total) by mouth daily. (Patient not taking: Reported on 08/05/2020) 90 tablet 3   Misc. Devices (HIBICLENS HAND PUMP 32OZ) MISC 1 Pump by Does not apply route daily as needed (to boils). (Patient not taking: Reported on 04/07/2021) 1 each 3   mupirocin nasal ointment (BACTROBAN) 2 % Place 1 application into the nose 2 (two) times daily. Use one-half of tube in each nostril twice daily for five (5) days. After application, press sides of nose together and gently massage. (Patient not taking: Reported on 04/07/2021) 10 g 0   No current facility-administered medications on file prior to visit.  ROS:  All others reviewed and negative.  Objective        PE:  BP (!) 142/76 (BP Location: Right Arm, Patient Position: Sitting, Cuff Size: Large)   Pulse 66   Temp 98.5 F (36.9 C) (Oral)   Ht 5\' 3"  (1.6 m)   Wt 234 lb 3.2 oz (106.2 kg)   SpO2 98%   BMI 41.49 kg/m                 Constitutional: Pt appears in NAD               HENT: Head: NCAT.                Right Ear: External ear normal.                 Left Ear: External ear normal.                Eyes: . Pupils are equal, round, and reactive to light. Conjunctivae and EOM are normal               Nose: without d/c or deformity               Neck: Neck supple. Gross normal ROM               Cardiovascular: Normal rate and regular rhythm.                 Pulmonary/Chest: Effort normal and breath sounds without rales or wheezing.                 Abd:  Soft, NT, ND, + BS, no organomegaly               Neurological: Pt is alert. At baseline orientation, motor grossly intact               Skin: Skin is warm. No rashes, no other new lesions, LE edema - ***               Psychiatric: Pt behavior is normal without agitation   Micro: none  Cardiac tracings I have personally interpreted today:  none  Pertinent Radiological findings (summarize): none   Lab Results  Component Value Date   WBC 4.8 10/06/2021   HGB 12.4 10/06/2021   HCT 38.0 10/06/2021   PLT 277.0 10/06/2021   GLUCOSE 95 10/06/2021   CHOL 152 10/06/2021   TRIG 75.0 10/06/2021   HDL 40.20 10/06/2021   LDLCALC 97 10/06/2021   ALT 15 10/06/2021   AST 18 10/06/2021   NA 142 10/06/2021   K 3.7 10/06/2021   CL 106 10/06/2021   CREATININE 0.81 10/06/2021   BUN 13 10/06/2021   CO2 29 10/06/2021   TSH 1.29 10/06/2021   HGBA1C 6.3 10/06/2021   MICROALBUR 1.4 09/30/2020   Assessment/Plan:  Suzanne [name] is a 65 y.o. Black or African American [2] female with  has a past medical history of Anxiety, ASCUS of cervix with negative high risk HPV (06/2018), Chronic headaches, Depression, Diabetes mellitus, type 2 (HCC), Diverticulosis, Gallstones, GERD, Hepatitis B, HYPERTENSION, OBESITY, RAYNAUDS SYNDROME, Seizures (HCC), and Stammering.  No problem-specific Assessment & Plan notes found for this encounter.  Followup: No follow-ups on file.  07/2018, MD 10/11/2021 10:50 AM [facility] Internal Medicine

## 2021-10-11 NOTE — Patient Instructions (Addendum)
Ok to continue to monitor your  BP at home, with the goal to be less than 140/90 at least  [facility] to cut back on the B12 to [place]- Wed - Friday  Please take all new medication as prescribed - the rybelsus 3 mg per day for sugar AND weight loss  Please have your Shingrix shot done at the [facility]  Please continue all other medications as before, and refills have been done if requested.  Please have the pharmacy call with any other refills you may need.  Please keep your appointments with your specialists as you may have planned  Please make an Appointment to return in 6 months, or sooner if needed

## 2021-10-13 ENCOUNTER — Other Ambulatory Visit: Payer: Self-pay | Admitting: Internal Medicine

## 2021-10-16 ENCOUNTER — Encounter: Payer: Self-pay | Admitting: Internal Medicine

## 2021-10-16 DIAGNOSIS — E538 Deficiency of other specified B group vitamins: Secondary | ICD-10-CM | POA: Insufficient documentation

## 2021-10-16 NOTE — Assessment & Plan Note (Signed)
Last vitamin D Lab Results  Component Value Date   VD25OH 42.14 10/06/2021   Stable, cont oral replacement

## 2021-10-16 NOTE — Assessment & Plan Note (Signed)
With situational worsening, but pt decines change in tx or counseling referral for now

## 2021-12-11 ENCOUNTER — Other Ambulatory Visit: Payer: Self-pay | Admitting: Internal Medicine

## 2021-12-11 DIAGNOSIS — I10 Essential (primary) hypertension: Secondary | ICD-10-CM

## 2021-12-12 NOTE — Telephone Encounter (Signed)
Please refill as per office routine med refill policy (all routine meds to be refilled for 3 mo or monthly (per pt preference) up to one year from last visit, then month to month grace period for 3 mo, then further med refills will have to be denied) ? ?

## 2021-12-16 ENCOUNTER — Ambulatory Visit: Payer: Medicare PPO | Admitting: Internal Medicine

## 2021-12-16 ENCOUNTER — Ambulatory Visit (INDEPENDENT_AMBULATORY_CARE_PROVIDER_SITE_OTHER): Payer: Medicare PPO

## 2021-12-16 ENCOUNTER — Encounter: Payer: Self-pay | Admitting: Internal Medicine

## 2021-12-16 VITALS — BP 120/80 | HR 66 | Temp 98.0°F | Ht 63.0 in | Wt 232.0 lb

## 2021-12-16 DIAGNOSIS — S8002XA Contusion of left knee, initial encounter: Secondary | ICD-10-CM | POA: Diagnosis not present

## 2021-12-16 DIAGNOSIS — M546 Pain in thoracic spine: Secondary | ICD-10-CM | POA: Diagnosis not present

## 2021-12-16 DIAGNOSIS — S61012A Laceration without foreign body of left thumb without damage to nail, initial encounter: Secondary | ICD-10-CM

## 2021-12-16 DIAGNOSIS — S8012XA Contusion of left lower leg, initial encounter: Secondary | ICD-10-CM | POA: Diagnosis not present

## 2021-12-16 DIAGNOSIS — M549 Dorsalgia, unspecified: Secondary | ICD-10-CM

## 2021-12-16 DIAGNOSIS — M542 Cervicalgia: Secondary | ICD-10-CM

## 2021-12-16 DIAGNOSIS — S8010XA Contusion of unspecified lower leg, initial encounter: Secondary | ICD-10-CM

## 2021-12-16 DIAGNOSIS — S8001XA Contusion of right knee, initial encounter: Secondary | ICD-10-CM | POA: Diagnosis not present

## 2021-12-16 DIAGNOSIS — S8011XA Contusion of right lower leg, initial encounter: Secondary | ICD-10-CM
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
## 2021-12-16 NOTE — Patient Instructions (Signed)
Please continue all other medications as before, and refills have been done if requested.  Please have the pharmacy call with any other refills you may need.  Please continue your efforts at being more active, low cholesterol diet, and weight control.  Please keep your appointments with your specialists as you may have planned  Please go to the XRAY Department in the first floor for the x-ray testing  You will be contacted by phone if any changes need to be made immediately.  Otherwise, you will receive a letter about your results with an explanation, but please check with MyChart first.  Please remember to sign up for MyChart if you have not done so, as this will be important to you in the future with finding out test results, communicating by private email, and scheduling acute appointments online when needed.     

## 2021-12-20 ENCOUNTER — Encounter: Payer: Self-pay | Admitting: Internal Medicine

## 2021-12-20 NOTE — Assessment & Plan Note (Signed)
Also for c spine films per pt reqeust

## 2021-12-20 NOTE — Assessment & Plan Note (Signed)
Small intact, no indication for stitches nor bleeding,  to f/u any worsening symptoms or concerns

## 2021-12-20 NOTE — Assessment & Plan Note (Signed)
Also for bilateral knee films r/o fx

## 2021-12-20 NOTE — Assessment & Plan Note (Signed)
For xray, pain control,  to f/u any worsening symptoms or concerns

## 2022-02-07 ENCOUNTER — Other Ambulatory Visit: Payer: Self-pay | Admitting: Internal Medicine

## 2022-02-07 DIAGNOSIS — I10 Essential (primary) hypertension: Secondary | ICD-10-CM

## 2022-02-07 NOTE — Telephone Encounter (Signed)
Please refill as per office routine med refill policy (all routine meds to be refilled for 3 mo or monthly (per pt preference) up to one year from last visit, then month to month grace period for 3 mo, then further med refills will have to be denied) ? ?

## 2022-04-12 ENCOUNTER — Encounter: Payer: Self-pay | Admitting: Internal Medicine

## 2022-04-12 ENCOUNTER — Other Ambulatory Visit: Payer: Self-pay | Admitting: Internal Medicine

## 2022-04-12 ENCOUNTER — Ambulatory Visit: Payer: Medicare PPO | Admitting: Internal Medicine

## 2022-04-12 VITALS — BP 136/82 | HR 71 | Temp 98.1°F | Ht 63.0 in | Wt 238.0 lb

## 2022-04-12 DIAGNOSIS — I10 Essential (primary) hypertension: Secondary | ICD-10-CM | POA: Diagnosis not present

## 2022-04-12 DIAGNOSIS — Z23 Encounter for immunization: Secondary | ICD-10-CM

## 2022-04-12 DIAGNOSIS — E7800 Pure hypercholesterolemia, unspecified: Secondary | ICD-10-CM | POA: Diagnosis not present

## 2022-04-12 DIAGNOSIS — R7303 Prediabetes: Secondary | ICD-10-CM

## 2022-04-12 DIAGNOSIS — E785 Hyperlipidemia, unspecified: Secondary | ICD-10-CM | POA: Insufficient documentation

## 2022-04-12 LAB — POCT GLYCOSYLATED HEMOGLOBIN (HGB A1C): Hemoglobin A1C: 5.9 % — AB (ref 4.0–5.6)

## 2022-04-12 MED ORDER — RYBELSUS 3 MG PO TABS: 3.0000 mg | ORAL_TABLET | Freq: Every day | ORAL | 3 refills | Status: DC

## 2022-04-12 MED ORDER — AMITRIPTYLINE HCL 50 MG PO TABS: 50.0000 mg | ORAL_TABLET | Freq: Every day | ORAL | 1 refills | Status: DC

## 2022-04-12 MED ORDER — METOPROLOL SUCCINATE ER 100 MG PO TB24: ORAL_TABLET | ORAL | 3 refills | Status: DC

## 2022-04-12 NOTE — Progress Notes (Signed)
Patient ID: Suzanne [name], female   DOB: 08-03-1955, 66 y.o.   MRN: [id number]        Chief Complaint: follow up htn, pre-DM, hld       HPI:  Suzanne [name] is a 66 y.o. female here overall doing ok, Pt denies chest pain, increased sob or doe, wheezing, orthopnea, PND, increased LE swelling, palpitations, dizziness or syncope.   Pt denies polydipsia, polyuria, or new focal neuro s/s.    Pt denies fever, wt loss, night sweats, loss of appetite, or other constitutional symptoms  Due for flu shot today, and shingrix at pharmacy.  Has not yet been able to start the rybelsus.  Declines statin today, wants to work on diet and lifestyle factors and repeat in 6 mo.   Wt Readings from Last 3 Encounters:  04/12/22 238 lb (108 kg)  12/16/21 232 lb (105.2 kg)  10/11/21 234 lb 3.2 oz (106.2 kg)   BP Readings from Last 3 Encounters:  04/12/22 136/82  12/16/21 120/80  10/11/21 (!) 142/76         Past Medical History:  Diagnosis Date   Anxiety    ASCUS of cervix with negative high risk HPV 06/2018   Chronic headaches    Depression    Diabetes mellitus, type 2 (HCC)    Diverticulosis    Gallstones    GERD    Hepatitis B    HYPERTENSION    OBESITY    RAYNAUDS SYNDROME    Seizures (HCC)    age 45 only one time   Stammering    Past Surgical History:  Procedure Laterality Date   CHOLECYSTECTOMY  2000   COLONOSCOPY     OTHER SURGICAL HISTORY  2000   ? if for diverticulosis, pt states they went through beely button    reports that she has never smoked. She has never used smokeless tobacco. She reports that she does not drink alcohol and does not use drugs. family history includes Allergies in her brother and son; Clotting disorder in her maternal aunt; Colon cancer in her maternal aunt; Diabetes in her maternal aunt and paternal aunt; Heart disease in her paternal uncle; Liver disease in her brother; Lung cancer in her father and maternal aunt; Pancreatic cancer in her  maternal uncle. Allergies  Allergen Reactions   Other Swelling    ALL SEAFOOD   Shrimp [Shellfish Allergy] Swelling    Pt states she allergic to seafood   Current Outpatient Medications on File Prior to Visit  Medication Sig Dispense Refill   clotrimazole-betamethasone (LOTRISONE) cream Use as directed twice per day as needed 45 g 1   nystatin (MYCOSTATIN/NYSTOP) powder Apply 1 application topically 3 (three) times daily. 15 g 2   venlafaxine XR (EFFEXOR-XR) 150 MG 24 hr capsule TAKE 1 CAPSULE BY MOUTH ONCE DAILY WITH BREAKFAST 90 capsule 3   No current facility-administered medications on file prior to visit.        ROS:  All others reviewed and negative.  Objective        PE:  BP 136/82 (BP Location: Right Arm, Patient Position: Sitting, Cuff Size: Large)   Pulse 71   Temp 98.1 F (36.7 C) (Oral)   Ht 5\' 3"  (1.6 m)   Wt 238 lb (108 kg)   SpO2 99%   BMI 42.16 kg/m                 Constitutional: Pt appears in NAD  HENT: Head: NCAT.                Right Ear: External ear normal.                 Left Ear: External ear normal.                Eyes: . Pupils are equal, round, and reactive to light. Conjunctivae and EOM are normal               Nose: without d/c or deformity               Neck: Neck supple. Gross normal ROM               Cardiovascular: Normal rate and regular rhythm.                 Pulmonary/Chest: Effort normal and breath sounds without rales or wheezing.                Abd:  Soft, NT, ND, + BS, no organomegaly               Neurological: Pt is alert. At baseline orientation, motor grossly intact               Skin: Skin is warm. No rashes, no other new lesions, LE edema - none               Psychiatric: Pt behavior is normal without agitation   Micro: none  Cardiac tracings I have personally interpreted today:  none  Pertinent Radiological findings (summarize): none   Lab Results  Component Value Date   WBC 4.8 10/06/2021   HGB 12.4  10/06/2021   HCT 38.0 10/06/2021   PLT 277.0 10/06/2021   GLUCOSE 95 10/06/2021   CHOL 152 10/06/2021   TRIG 75.0 10/06/2021   HDL 40.20 10/06/2021   LDLCALC 97 10/06/2021   ALT 15 10/06/2021   AST 18 10/06/2021   NA 142 10/06/2021   K 3.7 10/06/2021   CL 106 10/06/2021   CREATININE 0.81 10/06/2021   BUN 13 10/06/2021   CO2 29 10/06/2021   TSH 1.29 10/06/2021   HGBA1C 5.9 (A) 04/12/2022   MICROALBUR 1.4 09/30/2020   Hemoglobin A1C 4.0 - 5.6 % 5.9 Abnormal  6.3 R,    Assessment/Plan:  Suzanne [name] is a 66 y.o. Black or African American [2] female with  has a past medical history of Anxiety, ASCUS of cervix with negative high risk HPV (06/2018), Chronic headaches, Depression, Diabetes mellitus, type 2 (HCC), Diverticulosis, Gallstones, GERD, Hepatitis B, HYPERTENSION, OBESITY, RAYNAUDS SYNDROME, Seizures (HCC), and Stammering.  HLD (hyperlipidemia) Lab Results  Component Value Date   LDLCALC 97 10/06/2021   Uncontrolled, goal ldl < 70, pt for lower chol diet, declines statin   Hypertension, uncontrolled BP Readings from Last 3 Encounters:  04/12/22 136/82  12/16/21 120/80  10/11/21 (!) 142/76   Stable, pt to continue medical treatment toprol xl 100 mg qd   Pre-diabetes Lab Results  Component Value Date   HGBA1C 5.9 (A) 04/12/2022   With hyperglycemia and obesity, pt to start rybelsus 3 mg qd for sugar control and wt loss  Followup: Return in about 6 months (around 10/12/2022).  Oliver Barre, MD 04/12/2022 12:57 PM [facility]

## 2022-04-12 NOTE — Assessment & Plan Note (Signed)
BP Readings from Last 3 Encounters:  04/12/22 136/82  12/16/21 120/80  10/11/21 (!) 142/76   Stable, pt to continue medical treatment toprol xl 100 mg qd

## 2022-04-12 NOTE — Assessment & Plan Note (Signed)
Lab Results  Component Value Date   LDLCALC 97 10/06/2021   Uncontrolled, goal ldl < 70, pt for lower chol diet, declines statin

## 2022-04-12 NOTE — Patient Instructions (Addendum)
Please have your Shingrix (shingles) shots done at your local pharmacy.  You had the flu shot today  Your A1c was done today  Please take all new medication as prescribed - the rybelsus after the first of the year  Please continue all other medications as before, and refills have been done if requested.  Please have the pharmacy call with any other refills you may need.  Please continue your efforts at being more active, low cholesterol diet, and weight control.  Please keep your appointments with your specialists as you may have planned  Please make an Appointment to return in 6 months, or sooner if needed

## 2022-04-12 NOTE — Assessment & Plan Note (Signed)
Lab Results  Component Value Date   HGBA1C 5.9 (A) 04/12/2022   With hyperglycemia and obesity, pt to start rybelsus 3 mg qd for sugar control and wt loss

## 2022-05-08 ENCOUNTER — Ambulatory Visit (INDEPENDENT_AMBULATORY_CARE_PROVIDER_SITE_OTHER): Payer: Medicare PPO

## 2022-05-08 VITALS — Ht 63.0 in | Wt 235.0 lb

## 2022-05-08 DIAGNOSIS — Z0000 Encounter for general adult medical examination without abnormal findings: Secondary | ICD-10-CM

## 2022-05-08 NOTE — Patient Instructions (Signed)
Ms. Suzanne [name] , Thank you for taking time to come for your Medicare Wellness Visit. I appreciate your ongoing commitment to your health goals. Please review the following plan we discussed and let me know if I can assist you in the future.   These are the goals we discussed:  Goals      My goal for 2024 is to join [employer] [facility] [employer].        This is a list of the screening recommended for you and due dates:  Health Maintenance  Topic Date Due   Zoster (Shingles) Vaccine (1 of 2) 07/12/2022*   Medicare Annual Wellness Visit  05/09/2023   Mammogram  06/08/2023   Colon Cancer Screening  03/02/2026   Pneumonia Vaccine  Completed   Flu Shot  Completed   DEXA scan (bone density measurement)  Completed   Hepatitis C Screening: USPSTF Recommendation to screen - Ages 18-79 yo.  Completed   HPV Vaccine  Aged Out   DTaP/Tdap/Td vaccine  Discontinued   COVID-19 Vaccine  Discontinued  *Topic was postponed. The date shown is not the original due date.    Advanced directives: Patient has a Living Will.  Conditions/risks identified: Yes  Next appointment: Follow up in one year for your annual wellness visit.   Preventive Care 69 Years and Older, Female Preventive care refers to lifestyle choices and visits with your health care provider that can promote health and wellness. What does preventive care include? A yearly physical exam. This is also called an annual well check. Dental exams once or twice a year. Routine eye exams. Ask your health care provider how often you should have your eyes checked. Personal lifestyle choices, including: Daily care of your teeth and gums. Regular physical activity. Eating a healthy diet. Avoiding tobacco and drug use. Limiting alcohol use. Practicing safe sex. Taking low-dose aspirin every day. Taking vitamin and mineral supplements as recommended by your health care provider. What happens during an annual well check? The  services and screenings done by your health care provider during your annual well check will depend on your age, overall health, lifestyle risk factors, and family history of disease. Counseling  Your health care provider may ask you questions about your: Alcohol use. Tobacco use. Drug use. Emotional well-being. Home and relationship well-being. Sexual activity. Eating habits. History of falls. Memory and ability to understand (cognition). Work and work Statistician. Reproductive health. Screening  You may have the following tests or measurements: Height, weight, and BMI. Blood pressure. Lipid and cholesterol levels. These may be checked every 5 years, or more frequently if you are over 54 years old. Skin check. Lung cancer screening. You may have this screening every year starting at age 64 if you have a 30-pack-year history of smoking and currently smoke or have quit within the past 15 years. Fecal occult blood test (FOBT) of the stool. You may have this test every year starting at age 19. Flexible sigmoidoscopy or colonoscopy. You may have a sigmoidoscopy every 5 years or a colonoscopy every 10 years starting at age 49. Hepatitis C blood test. Hepatitis B blood test. Sexually transmitted disease (STD) testing. Diabetes screening. This is done by checking your blood sugar (glucose) after you have not eaten for a while (fasting). You may have this done every 1-3 years. Bone density scan. This is done to screen for osteoporosis. You may have this done starting at age 69. Mammogram. This may be done every 1-2 years. Talk  to your health care provider about how often you should have regular mammograms. Talk with your health care provider about your test results, treatment options, and if necessary, the need for more tests. Vaccines  Your health care provider may recommend certain vaccines, such as: Influenza vaccine. This is recommended every year. Tetanus, diphtheria, and acellular  pertussis (Tdap, Td) vaccine. You may need a Td booster every 10 years. Zoster vaccine. You may need this after age 75. Pneumococcal 13-valent conjugate (PCV13) vaccine. One dose is recommended after age 25. Pneumococcal polysaccharide (PPSV23) vaccine. One dose is recommended after age 44. Talk to your health care provider about which screenings and vaccines you need and how often you need them. This information is not intended to replace advice given to you by your health care provider. Make sure you discuss any questions you have with your health care provider. Document Released: 05/07/2015 Document Revised: 12/29/2015 Document Reviewed: 02/09/2015 Elsevier Interactive Patient Education  2017 [place] Prevention in the Home Falls can cause injuries. They can happen to people of all ages. There are many things you can do to make your home safe and to help prevent falls. What can I do on the outside of my home? Regularly fix the edges of walkways and driveways and fix any cracks. Remove anything that might make you trip as you walk through a door, such as a raised step or threshold. Trim any bushes or trees on the path to your home. Use bright outdoor lighting. Clear any walking paths of anything that might make someone trip, such as rocks or tools. Regularly check to see if handrails are loose or broken. Make sure that both sides of any steps have handrails. Any raised decks and porches should have guardrails on the edges. Have any leaves, snow, or ice cleared regularly. Use sand or salt on walking paths during winter. Clean up any spills in your garage right away. This includes oil or grease spills. What can I do in the bathroom? Use night lights. Install grab bars by the toilet and in the tub and shower. Do not use towel bars as grab bars. Use non-skid mats or decals in the tub or shower. If you need to sit down in the shower, use a plastic, non-slip stool. Keep the floor  dry. Clean up any water that spills on the floor as soon as it happens. Remove soap buildup in the tub or shower regularly. Attach bath mats securely with double-sided non-slip rug tape. Do not have throw rugs and other things on the floor that can make you trip. What can I do in the bedroom? Use night lights. Make sure that you have a light by your bed that is easy to reach. Do not use any sheets or blankets that are too big for your bed. They should not hang down onto the floor. Have a firm chair that has side arms. You can use this for support while you get dressed. Do not have throw rugs and other things on the floor that can make you trip. What can I do in the kitchen? Clean up any spills right away. Avoid walking on wet floors. Keep items that you use a lot in easy-to-reach places. If you need to reach something above you, use a strong step stool that has a grab bar. Keep electrical cords out of the way. Do not use floor polish or wax that makes floors slippery. If you must use wax, use non-skid floor wax. Do  not have throw rugs and other things on the floor that can make you trip. What can I do with my stairs? Do not leave any items on the stairs. Make sure that there are handrails on both sides of the stairs and use them. Fix handrails that are broken or loose. Make sure that handrails are as long as the stairways. Check any carpeting to make sure that it is firmly attached to the stairs. Fix any carpet that is loose or worn. Avoid having throw rugs at the top or bottom of the stairs. If you do have throw rugs, attach them to the floor with carpet tape. Make sure that you have a light switch at the top of the stairs and the bottom of the stairs. If you do not have them, ask someone to add them for you. What else can I do to help prevent falls? Wear shoes that: Do not have high heels. Have rubber bottoms. Are comfortable and fit you well. Are closed at the toe. Do not wear  sandals. If you use a stepladder: Make sure that it is fully opened. Do not climb a closed stepladder. Make sure that both sides of the stepladder are locked into place. Ask someone to hold it for you, if possible. Clearly mark and make sure that you can see: Any grab bars or handrails. First and last steps. Where the edge of each step is. Use tools that help you move around (mobility aids) if they are needed. These include: Canes. Walkers. Scooters. Crutches. Turn on the lights when you go into a dark area. Replace any light bulbs as soon as they burn out. Set up your furniture so you have a clear path. Avoid moving your furniture around. If any of your floors are uneven, fix them. If there are any pets around you, be aware of where they are. Review your medicines with your doctor. Some medicines can make you feel dizzy. This can increase your chance of falling. Ask your doctor what other things that you can do to help prevent falls. This information is not intended to replace advice given to you by your health care provider. Make sure you discuss any questions you have with your health care provider. Document Released: 02/04/2009 Document Revised: 09/16/2015 Document Reviewed: 05/15/2014 Elsevier Interactive Patient Education  2017 [employer].

## 2022-05-08 NOTE — Progress Notes (Signed)
Virtual Visit via Telephone Note  I connected with  Suzanne [name] on 05/08/22 at  9:45 AM EST by telephone and verified that I am speaking with the correct person using two identifiers.  Location: Patient: Home Provider: [facility] Persons participating in the virtual visit: patient/Nurse Health Advisor   I discussed the limitations, risks, security and privacy concerns of performing an evaluation and management service by telephone and the availability of in person appointments. The patient expressed understanding and agreed to proceed.  Interactive audio and video telecommunications were attempted between this nurse and patient, however failed, due to patient having technical difficulties OR patient did not have access to video capability.  We continued and completed visit with audio only.  Some vital signs may be absent or patient reported.   Mickeal Needy, LPN  Subjective:   Suzanne [name] is a 67 y.o. female who presents for an Initial Medicare Annual Wellness Visit.  Review of Systems     Cardiac Risk Factors include: advanced age (>16men, >31 women);dyslipidemia;family history of premature cardiovascular disease;obesity (BMI >30kg/m2)     Objective:    Today's Vitals   05/08/22 0947  Weight: 235 lb (106.6 kg)  Height: 5\' 3"  (1.6 m)  PainSc: 0-No pain   Body mass index is 41.63 kg/m.     05/08/2022    9:49 AM 09/30/2013    8:07 AM  Advanced Directives  Does Patient Have a Medical Advance Directive? Yes   Type of Advance Directive Living will   Pre-existing out of facility DNR order (yellow form or pink MOST form)  No    Current Medications (verified) Outpatient Encounter Medications as of 05/08/2022  Medication Sig   amitriptyline (ELAVIL) 50 MG tablet Take 1 tablet (50 mg total) by mouth at bedtime.   clotrimazole-betamethasone (LOTRISONE) cream Use as directed twice per day as needed   metoprolol succinate (TOPROL-XL) 100 MG 24  hr tablet Take with or immediately following a meal.   nystatin (MYCOSTATIN/NYSTOP) powder Apply 1 application topically 3 (three) times daily.   Semaglutide (RYBELSUS) 3 MG TABS Take 1 tablet by mouth daily. E11.9   venlafaxine XR (EFFEXOR-XR) 150 MG 24 hr capsule TAKE 1 CAPSULE BY MOUTH ONCE DAILY WITH BREAKFAST   No facility-administered encounter medications on file as of 05/08/2022.    Allergies (verified) Other and Shrimp [shellfish allergy]   History: Past Medical History:  Diagnosis Date   Anxiety    ASCUS of cervix with negative high risk HPV 06/2018   Chronic headaches    Depression    Diabetes mellitus, type 2 (HCC)    Diverticulosis    Gallstones    GERD    Hepatitis B    HYPERTENSION    OBESITY    RAYNAUDS SYNDROME    Seizures (HCC)    age 84 only one time   Stammering    Past Surgical History:  Procedure Laterality Date   CHOLECYSTECTOMY  2000   COLONOSCOPY     OTHER SURGICAL HISTORY  2000   ? if for diverticulosis, pt states they went through beely button   Family History  Problem Relation Age of Onset   Lung cancer Father    Allergies Brother    Liver disease Brother    Allergies Son    Lung cancer Maternal Aunt    Colon cancer Maternal Aunt    Clotting disorder Maternal Aunt    Diabetes Maternal Aunt    Pancreatic cancer Maternal Uncle  Diabetes Paternal Aunt    Heart disease Paternal Uncle    Social History   Socioeconomic History   Marital status: Married    Spouse name: Not on file   Number of children: 1   Years of education: Not on file   Highest education level: Not on file  Occupational History   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  Tobacco Use   Smoking status: Never   Smokeless tobacco: Never  Vaping Use   Vaping Use: Never used  Substance and Sexual Activity   Alcohol use: No   Drug use: No   Sexual activity: Yes    Partners: Male    Birth control/protection: Post-menopausal    Comment: 1st intercourse 1  yo-5 partners  Other Topics Concern   Not on file  Social History Narrative   Out of work 06/2011    Social Determinants of Health   Financial Resource Strain: Low Risk  (05/08/2022)   Overall Financial Resource Strain (CARDIA)    Difficulty of Paying Living Expenses: Not hard at all  Food Insecurity: No Food Insecurity (05/08/2022)   Hunger Vital Sign    Worried About Running Out of Food in the Last Year: Never true    Ran Out of Food in the Last Year: Never true  Transportation Needs: No Transportation Needs (05/08/2022)   PRAPARE - Administrator, Civil Service (Medical): No    Lack of Transportation (Non-Medical): No  Physical Activity: Inactive (05/08/2022)   Exercise Vital Sign    Days of Exercise per Week: 0 days    Minutes of Exercise per Session: 0 min  Stress: No Stress Concern Present (05/08/2022)   [employer] of Occupational Health - Occupational Stress Questionnaire    Feeling of Stress : Not at all  Social Connections: Socially Integrated (05/08/2022)   Social Connection and Isolation Panel [NHANES]    Frequency of Communication with Friends and Family: More than three times a week    Frequency of Social Gatherings with Friends and Family: More than three times a week    Attends Religious Services: More than 4 times per year    Active Member of [employer] or Organizations: Yes    Attends Engineer, structural: More than 4 times per year    Marital Status: Married    Tobacco Counseling Counseling given: Not Answered   Clinical Intake:  Pre-visit preparation completed: Yes  Pain : No/denies pain Pain Score: 0-No pain     BMI - recorded: 41.63 Nutritional Status: BMI > 30  Obese Nutritional Risks: None Diabetes: No  How often do you need to have someone help you when you read instructions, pamphlets, or other written materials from your doctor or pharmacy?: 1 - Never What is the last grade level you completed in school?: Master's Degree  in Adult Education  Diabetic? No  Interpreter Needed?: No  Information entered by :: Susie Cassette, LPN.   Activities of Daily Living    05/08/2022    9:54 AM  In your present state of health, do you have any difficulty performing the following activities:  Hearing? 0  Vision? 0  Difficulty concentrating or making decisions? 0  Walking or climbing stairs? 0  Dressing or bathing? 0  Doing errands, shopping? 0  Preparing Food and eating ? N  Using the Toilet? N  In the past six months, have you accidently leaked urine? N  Do you have problems with loss of bowel control? N  Managing your  Medications? N  Managing your Finances? N  Housekeeping or managing your Housekeeping? N    Patient Care Team: Biagio Borg, MD as PCP - General (Internal Medicine) Newton Pigg, MD as Referring Physician (Obstetrics and Gynecology) Ladene Artist, MD as Referring Physician (Gastroenterology) Collene Gobble, MD as Referring Physician (Pulmonary Disease) Desma Maxim, MD as Referring Physician (Ophthalmology) Marin Comment, My [place], [place] as Referring Physician (Optometry)  Indicate any recent Medical Services you may have received from other than [facility] providers in the past year (date may be approximate).     Assessment:   This is a routine wellness examination for Suzanne [name].  Hearing/Vision screen Hearing Screening - Comments:: Denies hearing difficulties   Vision Screening - Comments:: Wears rx glasses - up to date with routine eye exams with [facility] [place] [facility], OD.    Dietary issues and exercise activities discussed: Current Exercise Habits: The patient does not participate in regular exercise at present, Exercise limited by: None identified   Goals Addressed             This Visit's Progress    My goal for 2024 is to join [employer] [facility] [employer].        Depression Screen    05/08/2022    9:51 AM 04/12/2022   10:33 AM 12/16/2021    9:29 AM 12/16/2021    8:30 AM  10/11/2021   10:36 AM 09/30/2020   10:32 AM 09/30/2020   10:15 AM  PHQ 2/9 Scores  PHQ - 2 Score 0 0 0 0 4 0 1  PHQ- 9 Score 0 0 5  18      Fall Risk    05/08/2022    9:51 AM 04/12/2022   10:33 AM 12/16/2021    8:30 AM 10/11/2021   10:36 AM 09/30/2020   10:32 AM  Fall Risk   Falls in the past year? 0 0 0 0 0  Number falls in past yr: 0  0 0 0  Injury with Fall? 0 0 0 0 0  Risk for fall due to : No Fall Risks      Follow up Falls prevention discussed        FALL RISK PREVENTION PERTAINING TO THE HOME:  Any stairs in or around the home? No  If so, are there any without handrails? No  Home free of loose throw rugs in walkways, pet beds, electrical cords, etc? Yes  Adequate lighting in your home to reduce risk of falls? Yes   ASSISTIVE DEVICES UTILIZED TO PREVENT FALLS:  Life alert? No  Use of a cane, walker or w/c? No  Grab bars in the bathroom? No  Shower chair or bench in shower? No  Elevated toilet seat or a handicapped toilet? Yes   TIMED UP AND GO:  Was the test performed? No . Phone Visit  Cognitive Function:        05/08/2022    9:52 AM  6CIT Screen  What Year? 0 points  What month? 0 points  What time? 0 points  Count back from 20 0 points  Months in reverse 0 points  Repeat phrase 0 points  Total Score 0 points    Immunizations Immunization History  Administered Date(s) Administered   Fluad Quad(high Dose 65+) 12/31/2018, 04/12/2022   Influenza Whole 01/19/2010   Influenza, High Dose Seasonal PF 02/10/2021   Influenza,inj,Quad PF,6+ Mos 01/08/2018   Influenza-Unspecified 01/28/2020   PFIZER(Purple Top)SARS-COV-2 Vaccination 05/31/2019, 06/21/2019, 01/28/2020, 02/10/2021  Pneumococcal Conjugate-13 04/01/2020   Pneumococcal Polysaccharide-23 08/23/2015, 12/31/2018   Tdap 06/26/2011    TDAP status: Due, Education has been provided regarding the importance of this vaccine. Advised may receive this vaccine at local pharmacy or Health Dept. Aware to  provide a copy of the vaccination record if obtained from local pharmacy or Health Dept. Verbalized acceptance and understanding.  Flu Vaccine status: Up to date  Pneumococcal vaccine status: Up to date  Covid-19 vaccine status: Completed vaccines  Qualifies for Shingles Vaccine? Yes   Zostavax completed No   Shingrix Completed?: No.    Education has been provided regarding the importance of this vaccine. Patient has been advised to call insurance company to determine out of pocket expense if they have not yet received this vaccine. Advised may also receive vaccine at local pharmacy or Health Dept. Verbalized acceptance and understanding.  Screening Tests Health Maintenance  Topic Date Due   Zoster Vaccines- Shingrix (1 of 2) 07/12/2022 (Originally 11/01/2003)   Medicare Annual Wellness (AWV)  05/09/2023   MAMMOGRAM  06/08/2023   COLONOSCOPY (Pts 45-71yrs Insurance coverage will need to be confirmed)  03/02/2026   Pneumonia Vaccine 14+ Years old  Completed   INFLUENZA VACCINE  Completed   DEXA SCAN  Completed   Hepatitis C Screening  Completed   HPV VACCINES  Aged Out   DTaP/Tdap/Td  Discontinued   COVID-19 Vaccine  Discontinued    Health Maintenance  There are no preventive care reminders to display for this patient.  Colorectal cancer screening: Type of screening: Colonoscopy. Completed 03/02/2016. Repeat every 10 years  Mammogram status: Completed 06/07/2021. Repeat every year  Bone Density status: Completed 10/15/2018. Results reflect: Bone density results: NORMAL. Repeat every 5 years.  Lung Cancer Screening: (Low Dose CT Chest recommended if Age 65-80 years, 30 pack-year currently smoking OR have quit w/in 15years.) does not qualify.   Lung Cancer Screening Referral: no  Additional Screening:  Hepatitis C Screening: does qualify; Completed 08/23/2015  Vision Screening: Recommended annual ophthalmology exams for early detection of glaucoma and other disorders of the  eye. Is the patient up to date with their annual eye exam?  Yes  Who is the provider or what is the name of the office in which the patient attends annual eye exams? Ernst Breach, MD. If pt is not established with a provider, would they like to be referred to a provider to establish care? No .   Dental Screening: Recommended annual dental exams for proper oral hygiene  Community Resource Referral / Chronic Care Management: CRR required this visit?  No   CCM required this visit?  No      Plan:     I have personally reviewed and noted the following in the patient's chart:   Medical and social history Use of alcohol, tobacco or illicit drugs  Current medications and supplements including opioid prescriptions. Patient is not currently taking opioid prescriptions. Functional ability and status Nutritional status Physical activity Advanced directives List of other physicians Hospitalizations, surgeries, and ER visits in previous 12 months Vitals Screenings to include cognitive, depression, and falls Referrals and appointments  In addition, I have reviewed and discussed with patient certain preventive protocols, quality metrics, and best practice recommendations. A written personalized care plan for preventive services as well as general preventive health recommendations were provided to patient.     Sheral Flow, LPN   [id number]   Nurse Notes: N/A

## 2022-05-11 DIAGNOSIS — Z6841 Body Mass Index (BMI) 40.0 and over, adult: Secondary | ICD-10-CM | POA: Diagnosis not present

## 2022-05-11 DIAGNOSIS — I739 Peripheral vascular disease, unspecified: Secondary | ICD-10-CM | POA: Diagnosis not present

## 2022-05-11 DIAGNOSIS — F419 Anxiety disorder, unspecified: Secondary | ICD-10-CM | POA: Diagnosis not present

## 2022-05-11 DIAGNOSIS — Z823 Family history of stroke: Secondary | ICD-10-CM | POA: Diagnosis not present

## 2022-05-11 DIAGNOSIS — Z833 Family history of diabetes mellitus: Secondary | ICD-10-CM | POA: Diagnosis not present

## 2022-05-11 DIAGNOSIS — Z91013 Allergy to seafood: Secondary | ICD-10-CM | POA: Diagnosis not present

## 2022-05-11 DIAGNOSIS — Z8249 Family history of ischemic heart disease and other diseases of the circulatory system: Secondary | ICD-10-CM | POA: Diagnosis not present

## 2022-05-11 DIAGNOSIS — I10 Essential (primary) hypertension: Secondary | ICD-10-CM | POA: Diagnosis not present

## 2022-07-11 ENCOUNTER — Other Ambulatory Visit: Payer: Self-pay | Admitting: Internal Medicine

## 2022-07-11 DIAGNOSIS — Z0000 Encounter for general adult medical examination without abnormal findings: Secondary | ICD-10-CM

## 2022-09-15 ENCOUNTER — Other Ambulatory Visit: Payer: Self-pay

## 2022-09-15 ENCOUNTER — Other Ambulatory Visit: Payer: Self-pay | Admitting: Internal Medicine

## 2022-09-15 DIAGNOSIS — F419 Anxiety disorder, unspecified: Secondary | ICD-10-CM

## 2022-10-09 ENCOUNTER — Ambulatory Visit: Payer: Medicare PPO | Admitting: Internal Medicine

## 2022-10-09 ENCOUNTER — Encounter: Payer: Self-pay | Admitting: Internal Medicine

## 2022-10-09 VITALS — BP 132/80 | HR 79 | Temp 98.2°F | Ht 63.0 in | Wt 227.0 lb

## 2022-10-09 DIAGNOSIS — Z0001 Encounter for general adult medical examination with abnormal findings: Secondary | ICD-10-CM | POA: Diagnosis not present

## 2022-10-09 DIAGNOSIS — H60501 Unspecified acute noninfective otitis externa, right ear: Secondary | ICD-10-CM

## 2022-10-09 DIAGNOSIS — I10 Essential (primary) hypertension: Secondary | ICD-10-CM | POA: Diagnosis not present

## 2022-10-09 DIAGNOSIS — N644 Mastodynia: Secondary | ICD-10-CM | POA: Diagnosis not present

## 2022-10-09 DIAGNOSIS — E559 Vitamin D deficiency, unspecified: Secondary | ICD-10-CM | POA: Diagnosis not present

## 2022-10-09 DIAGNOSIS — E538 Deficiency of other specified B group vitamins: Secondary | ICD-10-CM | POA: Diagnosis not present

## 2022-10-09 DIAGNOSIS — R7303 Prediabetes: Secondary | ICD-10-CM | POA: Diagnosis not present

## 2022-10-09 LAB — LIPID PANEL
Cholesterol: 159 mg/dL (ref 0–200)
HDL: 43.6 mg/dL (ref >39.00)
LDL Cholesterol: 99 mg/dL (ref 0–99)
NonHDL: 115.61
Total CHOL/HDL Ratio: 4
Triglycerides: 84 mg/dL (ref 0.0–149.0)
VLDL: 16.8 mg/dL (ref 0.0–40.0)

## 2022-10-09 LAB — HEPATIC FUNCTION PANEL
ALT: 12 U/L (ref 0–35)
AST: 19 U/L (ref 0–37)
Albumin: 4.1 g/dL (ref 3.5–5.2)
Alkaline Phosphatase: 93 U/L (ref 39–117)
Bilirubin, Direct: 0.1 mg/dL (ref 0.0–0.3)
Total Bilirubin: 0.3 mg/dL (ref 0.2–1.2)
Total Protein: 7.9 g/dL (ref 6.0–8.3)

## 2022-10-09 LAB — URINALYSIS, ROUTINE W REFLEX MICROSCOPIC
Bilirubin Urine: NEGATIVE
Hgb urine dipstick: NEGATIVE
Ketones, ur: NEGATIVE
Nitrite: NEGATIVE
Specific Gravity, Urine: 1.025 (ref 1.000–1.030)
Total Protein, Urine: NEGATIVE
Urine Glucose: NEGATIVE
Urobilinogen, UA: 1 (ref 0.0–1.0)
pH: 6 (ref 5.0–8.0)

## 2022-10-09 LAB — CBC WITH DIFFERENTIAL/PLATELET
Basophils Absolute: 0.1 10*3/uL (ref 0.0–0.1)
Basophils Relative: 1.2 % (ref 0.0–3.0)
Eosinophils Absolute: 0.1 10*3/uL (ref 0.0–0.7)
Eosinophils Relative: 2.6 % (ref 0.0–5.0)
HCT: 40.5 % (ref 36.0–46.0)
Hemoglobin: 12.9 g/dL (ref 12.0–15.0)
Lymphocytes Relative: 32.5 % (ref 12.0–46.0)
Lymphs Abs: 1.4 10*3/uL (ref 0.7–4.0)
MCHC: 32 g/dL (ref 30.0–36.0)
MCV: 88.5 fl (ref 78.0–100.0)
Monocytes Absolute: 0.3 10*3/uL (ref 0.1–1.0)
Monocytes Relative: 7.5 % (ref 3.0–12.0)
Neutro Abs: 2.5 10*3/uL (ref 1.4–7.7)
Neutrophils Relative %: 56.2 % (ref 43.0–77.0)
Platelets: 299 10*3/uL (ref 150.0–400.0)
RBC: 4.57 Mil/uL (ref 3.87–5.11)
RDW: 15.6 % — ABNORMAL HIGH (ref 11.5–15.5)
WBC: 4.4 10*3/uL (ref 4.0–10.5)

## 2022-10-09 LAB — VITAMIN D 25 HYDROXY (VIT D DEFICIENCY, FRACTURES): VITD: 46.7 ng/mL (ref 30.00–100.00)

## 2022-10-09 LAB — HEMOGLOBIN A1C: Hgb A1c MFr Bld: 6.1 % (ref 4.6–6.5)

## 2022-10-09 LAB — BASIC METABOLIC PANEL
BUN: 11 mg/dL (ref 6–23)
CO2: 31 mEq/L (ref 19–32)
Calcium: 9.6 mg/dL (ref 8.4–10.5)
Chloride: 102 mEq/L (ref 96–112)
Creatinine, Ser: 0.85 mg/dL (ref 0.40–1.20)
GFR: 70.14 mL/min (ref >60.00)
Glucose, Bld: 89 mg/dL (ref 70–99)
Potassium: 4.2 mEq/L (ref 3.5–5.1)
Sodium: 139 mEq/L (ref 135–145)

## 2022-10-09 LAB — VITAMIN B12: Vitamin B-12: 494 pg/mL (ref 211–911)

## 2022-10-09 LAB — MICROALBUMIN / CREATININE URINE RATIO
Creatinine,U: 193 mg/dL
Microalb Creat Ratio: 0.7 mg/g (ref 0.0–30.0)
Microalb, Ur: 1.3 mg/dL (ref 0.0–1.9)

## 2022-10-09 LAB — TSH: TSH: 1.74 u[IU]/mL (ref 0.35–5.50)

## 2022-10-09 MED ORDER — NEOMYCIN-POLYMYXIN-HC 3.5-10000-1 OT SOLN: 3.0000 [drp] | Freq: Four times a day (QID) | OTIC | 0 refills | Status: AC

## 2022-10-09 NOTE — Progress Notes (Unsigned)
Patient ID: Suzanne [name], female   DOB: Aug 24, 1955, 67 y.o.   MRN: [id number]         Chief Complaint:: wellness exam and left breast pain, right ear pain, htn, preDM, low b12 and vit d       HPI:  Suzanne [name] is a 67 y.o. female here for wellness exam; for shingrix at pharmacy, o/w up to date                Also has right ear pain without hearing loss or vertigo x 3 days to right ear, no discharge.  Also has 2 wk onset mild intermittent left lateral aspect breast pain without redness, swelling, fever or mass.  Pt denies chest pain, increased sob or doe, wheezing, orthopnea, PND, increased LE swelling, palpitations, dizziness or syncope.   Pt denies polydipsia, polyuria, or new focal neuro s/s.    Pt denies fever, wt loss, night sweats, loss of appetite, or other constitutional symptoms     Wt Readings from Last 3 Encounters:  10/09/22 227 lb (103 kg)  05/08/22 235 lb (106.6 kg)  04/12/22 238 lb (108 kg)   BP Readings from Last 3 Encounters:  10/09/22 132/80  04/12/22 136/82  12/16/21 120/80   Immunization History  Administered Date(s) Administered   Fluad Quad(high Dose 65+) 12/31/2018, 04/12/2022   Influenza Whole 01/19/2010   Influenza, High Dose Seasonal PF 02/10/2021   Influenza,inj,Quad PF,6+ Mos 01/08/2018   Influenza-Unspecified 01/28/2020   PFIZER(Purple Top)SARS-COV-2 Vaccination 05/31/2019, 06/21/2019, 01/28/2020, 02/10/2021   Pneumococcal Conjugate-13 04/01/2020   Pneumococcal Polysaccharide-23 08/23/2015, 12/31/2018   Tdap 06/26/2011   Health Maintenance Due  Topic Date Due   Zoster Vaccines- Shingrix (1 of 2) Never done      Past Medical History:  Diagnosis Date   Anxiety    ASCUS of cervix with negative high risk HPV 06/2018   Chronic headaches    Depression    Diabetes mellitus, type 2 (HCC)    Diverticulosis    Gallstones    GERD    Hepatitis B    HYPERTENSION    OBESITY    RAYNAUDS SYNDROME    Seizures (HCC)    age 83 only  one time   Stammering    Past Surgical History:  Procedure Laterality Date   CHOLECYSTECTOMY  2000   COLONOSCOPY     OTHER SURGICAL HISTORY  2000   ? if for diverticulosis, pt states they went through beely button    reports that she has never smoked. She has never used smokeless tobacco. She reports that she does not drink alcohol and does not use drugs. family history includes Allergies in her brother and son; Clotting disorder in her maternal aunt; Colon cancer in her maternal aunt; Diabetes in her maternal aunt and paternal aunt; Heart disease in her paternal uncle; Liver disease in her brother; Lung cancer in her father and maternal aunt; Pancreatic cancer in her maternal uncle. Allergies  Allergen Reactions   Other Swelling    ALL SEAFOOD   Shrimp [Shellfish Allergy] Swelling    Pt states she allergic to seafood   Current Outpatient Medications on File Prior to Visit  Medication Sig Dispense Refill   amitriptyline (ELAVIL) 50 MG tablet Take 1 tablet (50 mg total) by mouth at bedtime. 90 tablet 1   clotrimazole-betamethasone (LOTRISONE) cream Use as directed twice per day as needed 45 g 1   metoprolol succinate (TOPROL-XL) 100 MG 24 hr tablet Take with or immediately  following a meal. 90 tablet 3   nystatin (MYCOSTATIN/NYSTOP) powder Apply 1 application topically 3 (three) times daily. 15 g 2   Semaglutide (RYBELSUS) 3 MG TABS Take 1 tablet by mouth daily. E11.9 90 tablet 3   venlafaxine XR (EFFEXOR-XR) 150 MG 24 hr capsule TAKE 1 CAPSULE BY MOUTH ONCE DAILY WITH BREAKFAST 90 capsule 0   No current facility-administered medications on file prior to visit.        ROS:  All others reviewed and negative.  Objective        PE:  BP 132/80 (BP Location: Left Arm, Patient Position: Sitting, Cuff Size: Normal)   Pulse 79   Temp 98.2 F (36.8 C) (Oral)   Ht 5\' 3"  (1.6 m)   Wt 227 lb (103 kg)   SpO2 97%   BMI 40.21 kg/m                 Constitutional: Pt appears in NAD                HENT: Head: NCAT.                Right Ear: External ear normal.  Right ext canal with trace to 1+ red, tender swelling               Left Ear: External ear normal.                Eyes: . Pupils are equal, round, and reactive to light. Conjunctivae and EOM are normal               Nose: without d/c or deformity               Neck: Neck supple. Gross normal ROM               Cardiovascular: Normal rate and regular rhythm.                 Pulmonary/Chest: Effort normal and breath sounds without rales or wheezing.                Abd:  Soft, NT, ND, + BS, no organomegaly               Neurological: Pt is alert. At baseline orientation, motor grossly intact               Skin: Skin is warm. No rashes, no other new lesions, LE edema - none               Psychiatric: Pt behavior is normal without agitation   Micro: none  Cardiac tracings I have personally interpreted today:  none  Pertinent Radiological findings (summarize): none   Lab Results  Component Value Date   WBC 4.4 10/09/2022   HGB 12.9 10/09/2022   HCT 40.5 10/09/2022   PLT 299.0 10/09/2022   GLUCOSE 89 10/09/2022   CHOL 159 10/09/2022   TRIG 84.0 10/09/2022   HDL 43.60 10/09/2022   LDLCALC 99 10/09/2022   ALT 12 10/09/2022   AST 19 10/09/2022   NA 139 10/09/2022   K 4.2 10/09/2022   CL 102 10/09/2022   CREATININE 0.85 10/09/2022   BUN 11 10/09/2022   CO2 31 10/09/2022   TSH 1.74 10/09/2022   HGBA1C 6.1 10/09/2022   MICROALBUR 1.3 10/09/2022   Assessment/Plan:  [name] is a 67 y.o. Black or African American [2] female with  has a past medical history of Anxiety, ASCUS  of cervix with negative high risk HPV (06/2018), Chronic headaches, Depression, Diabetes mellitus, type 2 (HCC), Diverticulosis, Gallstones, GERD, Hepatitis B, HYPERTENSION, OBESITY, RAYNAUDS SYNDROME, Seizures (HCC), and Stammering.  Encounter for well adult exam with abnormal findings Age and sex appropriate education and  counseling updated with regular exercise and diet Referrals for preventative services - for shingrix at pharmacy Immunizations addressed - none needed Smoking counseling  - none needed Evidence for depression or other mood disorder - none significant Most recent labs reviewed. I have personally reviewed and have noted: 1) the patient's medical and social history 2) The patient's current medications and supplements 3) The patient's height, weight, and BMI have been recorded in the chart   Hypertension, uncontrolled BP Readings from Last 3 Encounters:  10/09/22 132/80  04/12/22 136/82  12/16/21 120/80   Stable, pt to continue medical treatment toprol xl 100 qd   Pre-diabetes Lab Results  Component Value Date   HGBA1C 6.1 10/09/2022   Stable, pt to continue current medical treatment rybelsus 3 mg qd   Vitamin D deficiency Last vitamin D Lab Results  Component Value Date   VD25OH 46.70 10/09/2022   Stable, cont oral replacement   B12 deficiency Lab Results  Component Value Date   VITAMINB12 494 10/09/2022   Stable, cont oral replacement - b12 1000 mcg qd   External otitis of right ear Mild to mod, for antibx course cortisporin asd,  to f/u any worsening symptoms or concerns  Pain of left breast Pt for diagnostic mammogram  Followup: Return in about 6 months (around 04/10/2023).  Oliver Barre, MD 10/12/2022 8:03 PM [facility]

## 2022-10-09 NOTE — Progress Notes (Signed)
The test results show that your current treatment is OK, as the tests are stable.  Please continue the same plan.  There is no other need for change of treatment or further evaluation based on these results, at this time.  thanks 

## 2022-10-09 NOTE — Patient Instructions (Addendum)
Please have your Shingrix (shingles) shots done at your local pharmacy.  Please take all new medication as prescribed - the ear drops   Please continue all other medications as before, and refills have been done if requested.  Please have the pharmacy call with any other refills you may need.  Please continue your efforts at being more active, low cholesterol diet, and weight control.  You are otherwise up to date with prevention measures today.  Please keep your appointments with your specialists as you may have planned  Please go to the LAB at the blood drawing area for the tests to be done  You will be contacted by phone if any changes need to be made immediately.  Otherwise, you will receive a letter about your results with an explanation, but please check with MyChart first.  Please remember to sign up for MyChart if you have not done so, as this will be important to you in the future with finding out test results, communicating by private email, and scheduling acute appointments online when needed.  Please make an Appointment to return in 6 months, or sooner if needed

## 2022-10-12 ENCOUNTER — Encounter: Payer: Self-pay | Admitting: Internal Medicine

## 2022-10-12 DIAGNOSIS — N644 Mastodynia: Secondary | ICD-10-CM | POA: Insufficient documentation

## 2022-10-12 NOTE — Assessment & Plan Note (Signed)
Lab Results  Component Value Date   HGBA1C 6.1 10/09/2022   Stable, pt to continue current medical treatment rybelsus 3 mg qd

## 2022-10-12 NOTE — Assessment & Plan Note (Signed)
Last vitamin D Lab Results  Component Value Date   VD25OH 46.70 10/09/2022   Stable, cont oral replacement

## 2022-10-12 NOTE — Assessment & Plan Note (Signed)
Pt for diagnostic mammogram

## 2022-10-12 NOTE — Assessment & Plan Note (Signed)
Age and sex appropriate education and counseling updated with regular exercise and diet Referrals for preventative services - for shingrix at pharmacy Immunizations addressed - none needed Smoking counseling  - none needed Evidence for depression or other mood disorder - none significant Most recent labs reviewed. I have personally reviewed and have noted: 1) the patient's medical and social history 2) The patient's current medications and supplements 3) The patient's height, weight, and BMI have been recorded in the chart

## 2022-10-12 NOTE — Assessment & Plan Note (Signed)
Mild to mod, for antibx course cortisporin asd,  to f/u any worsening symptoms or concerns 

## 2022-10-12 NOTE — Assessment & Plan Note (Signed)
BP Readings from Last 3 Encounters:  10/09/22 132/80  04/12/22 136/82  12/16/21 120/80   Stable, pt to continue medical treatment toprol xl 100 qd

## 2022-10-12 NOTE — Assessment & Plan Note (Signed)
Lab Results  Component Value Date   VITAMINB12 494 10/09/2022   Stable, cont oral replacement - b12 1000 mcg qd

## 2022-11-02 ENCOUNTER — Ambulatory Visit
Admission: RE | Admit: 2022-11-02 | Discharge: 2022-11-02 | Disposition: A | Discharge status: Home / Self Care | Payer: Medicare PPO | Source: Ambulatory Visit | Attending: Internal Medicine | Admitting: Internal Medicine

## 2022-11-02 ENCOUNTER — Ambulatory Visit: Payer: Medicare PPO

## 2022-11-02 DIAGNOSIS — N644 Mastodynia: Secondary | ICD-10-CM | POA: Diagnosis not present

## 2022-11-08 LAB — HM DIABETES EYE EXAM

## 2022-11-14 ENCOUNTER — Encounter: Payer: Self-pay | Admitting: Internal Medicine

## 2022-12-08 ENCOUNTER — Ambulatory Visit: Payer: Medicare PPO | Admitting: Family Medicine

## 2022-12-08 ENCOUNTER — Encounter: Payer: Self-pay | Admitting: Family Medicine

## 2022-12-08 VITALS — BP 132/80 | HR 68 | Temp 97.6°F | Ht 63.0 in | Wt 228.0 lb

## 2022-12-08 DIAGNOSIS — R21 Rash and other nonspecific skin eruption: Secondary | ICD-10-CM | POA: Diagnosis not present

## 2022-12-08 DIAGNOSIS — B029 Zoster without complications: Secondary | ICD-10-CM

## 2022-12-08 MED ORDER — VALACYCLOVIR HCL 1 G PO TABS
1000.0000 mg | ORAL_TABLET | Freq: Three times a day (TID) | ORAL | 0 refills | Status: AC
Start: 2022-12-08 — End: 2022-12-15

## 2022-12-08 NOTE — Patient Instructions (Signed)
Start valacyclovir and take it as prescribed.  Use cool compresses to help with itching and pain  Take Tylenol 1000 mg twice daily and/or ibuprofen 800 mg every 8 hours with food.  Use Tegaderm to cover the area while you are in the water.   Shingles  Shingles is an infection. It gives you a painful skin rash and blisters that have fluid in them. Shingles is caused by the same germ (virus) that causes chickenpox. Shingles only happens in people who: Have had chickenpox. Have been given a shot (vaccine) to protect against chickenpox. Shingles is rare in this group. What are the causes? This condition is caused by varicella-zoster virus. This is the same germ that causes chickenpox. After a person is exposed to the germ, the germ stays in the body but is not active (dormant). Shingles develops if the germ becomes active again (is reactivated). This can happen many years after the first exposure to the germ. It is not known what causes this germ to become active again. What increases the risk? People who have had chickenpox or received the chickenpox shot are at risk for shingles. This infection is more common in people who: Are older than 69 years of age. Have a weakened disease-fighting system (immune system), such as people with: HIV (human immunodeficiency virus). AIDS (acquired immunodeficiency syndrome). Cancer. Are taking medicines that weaken the immune system, such as organ transplant medicines. Have a lot of stress. What are the signs or symptoms? The first symptoms of shingles may be itching, tingling, or pain in an area on your skin. A rash will show on your skin a few days or weeks later. This is what usually happens: The rash is likely to be on one side of your body. The rash usually has a shape like a belt or a band. Over time, the rash turns into fluid-filled blisters. The blisters will break open and change into scabs. The scabs usually dry up in about 2-3 weeks. You  may also have: A fever. Chills. A headache. A feeling like you may vomit (nausea). How is this treated? The rash may last for several weeks. There is not a specific cure for this condition. Your doctor may prescribe medicines. Medicines may: Help with pain. Help you get better sooner. Help to prevent long-term problems. Help with itching (antihistamines). If the area involved is on your face, you may need to see a specialist. This may be an eye doctor or an ear, nose, and throat (ENT) doctor. Follow these instructions at home: Medicines Take over-the-counter and prescription medicines only as told by your doctor. Put on an anti-itch cream or numbing cream where you have a rash, blisters, or scabs. Do this as told by your doctor. Helping with itching and discomfort  Put cold, wet cloths (cold compresses) on the area of the rash or blisters as told by your doctor. Cool baths can help you feel better. Try adding baking soda or dry oatmeal to the water to lessen itching. Do not bathe in hot water. Use calamine lotion as told by your doctor. Blister and rash care Keep your rash covered with a loose bandage (dressing). Wear loose clothing that does not rub on your rash. Wash your hands with soap and water for at least 20 seconds before and after you change your bandage. If you cannot use soap and water, use hand sanitizer. Change your bandage as told by your doctor. Keep your rash and blisters clean. To do this, wash the area  with mild soap and cool water as told by your doctor. Check your rash every day for signs of infection. Check for: More redness, swelling, or pain. Fluid or blood. Warmth. Pus or a bad smell. Do not scratch your rash. Do not pick at your blisters. To help you to not scratch: Keep your fingernails clean and cut short. Wear gloves or mittens when you sleep, if scratching is a problem. General instructions Rest as told by your doctor. Wash your hands often with soap  and water for at least 20 seconds. If you cannot use soap and water, use hand sanitizer. Doing this lowers your chance of getting a skin infection. Your infection can cause chickenpox in people who have never had chickenpox or never got a chickenpox vaccine shot. If you have blisters that did not change into scabs yet, try not to touch other people or be around other people, especially: Babies. Pregnant women. Children who have areas of red, itchy, or rough skin (eczema). Older people who have organ transplants. People who have a long-term (chronic) illness, like cancer or AIDS. Keep all follow-up visits. How is this prevented? A vaccine shot is the best way to prevent shingles and protect against shingles problems. If you have not had a vaccine shot, talk with your doctor about getting it. Where to find more information Centers for Disease Control and Prevention: [employer] Contact a doctor if: Your pain does not get better with medicine. Your pain does not get better after the rash heals. You have any of these signs of infection around the rash: More redness, swelling, or pain. Fluid or blood. Warmth. Pus or a bad smell. You have a fever. Get help right away if: The rash is on your face or nose. You have pain in your face or pain by your eye. You lose feeling on one side of your face. You have trouble seeing. You have ear pain, or you have ringing in your ear. You have a loss of taste. Your condition gets worse. Summary Shingles gives you a painful skin rash and blisters that have fluid in them. Shingles is caused by the same germ (virus) that causes chickenpox. Keep your rash covered with a loose bandage. Wear loose clothing that does not rub on your rash. If you have blisters that did not change into scabs yet, try not to touch other people or be around people. This information is not intended to replace advice given to you by your health care provider. Make sure you discuss  any questions you have with your health care provider. Document Revised: 04/05/2020 Document Reviewed: 04/05/2020 Elsevier Patient Education  2024 [employer].
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
## 2022-12-14 ENCOUNTER — Other Ambulatory Visit: Payer: Self-pay | Admitting: Internal Medicine

## 2022-12-14 ENCOUNTER — Other Ambulatory Visit: Payer: Self-pay

## 2022-12-14 DIAGNOSIS — F419 Anxiety disorder, unspecified: Secondary | ICD-10-CM

## 2023-03-06 ENCOUNTER — Other Ambulatory Visit: Payer: Self-pay

## 2023-03-06 ENCOUNTER — Encounter (HOSPITAL_COMMUNITY): Payer: Self-pay | Admitting: Emergency Medicine

## 2023-03-06 ENCOUNTER — Ambulatory Visit (HOSPITAL_COMMUNITY)
Admission: EM | Admit: 2023-03-06 | Discharge: 2023-03-06 | Disposition: A | Discharge status: Home / Self Care | Payer: Medicare PPO | Attending: Emergency Medicine | Admitting: Emergency Medicine

## 2023-03-06 DIAGNOSIS — M542 Cervicalgia: Secondary | ICD-10-CM | POA: Diagnosis not present

## 2023-03-06 MED ORDER — ACETAMINOPHEN 325 MG PO TABS
ORAL_TABLET | ORAL | Status: AC
  Filled 2023-03-06: qty 3

## 2023-03-06 MED ORDER — ACETAMINOPHEN 325 MG PO TABS
ORAL_TABLET | ORAL | Status: AC
Start: 2023-03-06 — End: ?
  Filled 2023-03-06: qty 3

## 2023-03-06 MED ORDER — BACLOFEN 5 MG PO TABS: 5.0000 mg | ORAL_TABLET | Freq: Every evening | ORAL | 0 refills | Status: AC

## 2023-03-06 MED ORDER — ACETAMINOPHEN 325 MG PO TABS
975.0000 mg | ORAL_TABLET | Freq: Once | ORAL | Status: AC
  Administered 2023-03-06: 975 mg via ORAL
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
## 2023-03-06 NOTE — Discharge Instructions (Addendum)
You can take the muscle relaxer (baclofen) at bed time. Ibuprofen and/or tylenol every 6 hours Avoid heavy lifting and strenuous activity  Unfortunately you may feel worse before you get better. It may take several days to a week for symptoms to improve.  With any severe or new symptoms, please go directly to the emergency department
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
## 2023-04-09 ENCOUNTER — Ambulatory Visit: Payer: Medicare PPO | Admitting: Internal Medicine

## 2023-04-09 ENCOUNTER — Encounter: Payer: Self-pay | Admitting: Internal Medicine

## 2023-04-09 VITALS — BP 120/78 | HR 68 | Temp 97.8°F | Ht 63.0 in | Wt 237.0 lb

## 2023-04-09 DIAGNOSIS — E7800 Pure hypercholesterolemia, unspecified: Secondary | ICD-10-CM

## 2023-04-09 DIAGNOSIS — E559 Vitamin D deficiency, unspecified: Secondary | ICD-10-CM | POA: Diagnosis not present

## 2023-04-09 DIAGNOSIS — Z23 Encounter for immunization: Secondary | ICD-10-CM

## 2023-04-09 DIAGNOSIS — R7303 Prediabetes: Secondary | ICD-10-CM

## 2023-04-09 DIAGNOSIS — I10 Essential (primary) hypertension: Secondary | ICD-10-CM | POA: Diagnosis not present

## 2023-04-09 DIAGNOSIS — R6889 Other general symptoms and signs: Secondary | ICD-10-CM | POA: Diagnosis not present

## 2023-04-09 DIAGNOSIS — F419 Anxiety disorder, unspecified: Secondary | ICD-10-CM

## 2023-04-09 DIAGNOSIS — E538 Deficiency of other specified B group vitamins: Secondary | ICD-10-CM
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
## 2023-04-09 NOTE — Patient Instructions (Addendum)
You had the Prevnar 20 pneumonia shot today  Please consider having the RSV and Shingles shots done at the pharmacy  Please continue all other medications as before, and refills have been done if requested.  Please have the pharmacy call with any other refills you may need.  Please continue your efforts at being more active, low cholesterol diet, and weight control.  Please keep your appointments with your specialists as you may have planned  You will be contacted regarding the referral for: counseling  We can hold on lab testing today  Please make an Appointment to return in 6 months, or sooner if needed

## 2023-04-10 ENCOUNTER — Encounter: Payer: Self-pay | Admitting: Internal Medicine

## 2023-04-10 NOTE — Assessment & Plan Note (Signed)
Mild to mod, for counseling referral, to f/u any worsening symptoms or concerns

## 2023-04-10 NOTE — Assessment & Plan Note (Signed)
Lab Results  Component Value Date   VITAMINB12 494 10/09/2022   Stable, cont oral replacement - b12 1000 mcg qd

## 2023-04-10 NOTE — Assessment & Plan Note (Signed)
Lab Results  Component Value Date   LDLCALC 99 10/09/2022   Stable, pt to continue low chol diet, declines statin fo rnow

## 2023-04-10 NOTE — Assessment & Plan Note (Signed)
Last vitamin D Lab Results  Component Value Date   VD25OH 46.70 10/09/2022   Stable, cont oral replacement

## 2023-04-10 NOTE — Assessment & Plan Note (Signed)
BP Readings from Last 3 Encounters:  04/09/23 120/78  03/06/23 (!) 150/85  12/08/22 132/80   Stable, pt to continue medical treatment toprol xl 100 qd

## 2023-04-10 NOTE — Assessment & Plan Note (Signed)
Lab Results  Component Value Date   HGBA1C 6.1 10/09/2022   Stable, pt to continue current medical treatment  - diet, wt control

## 2023-05-02 ENCOUNTER — Ambulatory Visit (INDEPENDENT_AMBULATORY_CARE_PROVIDER_SITE_OTHER): Payer: Medicare Other

## 2023-05-02 VITALS — Ht 64.0 in | Wt 237.0 lb

## 2023-05-02 DIAGNOSIS — Z0000 Encounter for general adult medical examination without abnormal findings: Secondary | ICD-10-CM | POA: Diagnosis not present

## 2023-05-02 NOTE — Progress Notes (Signed)
 Subjective:   Suzanne [name] is a 68 y.o. female who presents for Medicare Annual (Subsequent) preventive examination.  Visit Complete: Virtual I connected with  Mckenze O Delatte on 05/02/23 by a audio enabled telemedicine application and verified that I am speaking with the correct person using two identifiers.  Patient Location: Home  Provider Location: Office/Clinic  I discussed the limitations of evaluation and management by telemedicine. The patient expressed understanding and agreed to proceed.  Vital Signs: Because this visit was a virtual/telehealth visit, some criteria may be missing or patient reported. Any vitals not documented were not able to be obtained and vitals that have been documented are patient reported.   Cardiac Risk Factors include: advanced age (>8men, >61 women);hypertension;dyslipidemia;obesity (BMI >30kg/m2);Other (see comment), Risk factor comments: Raynauds syndrome     Objective:    Today's Vitals   05/02/23 0819  Weight: 237 lb (107.5 kg)  Height: 5' 4 (1.626 m)   Body mass index is 40.68 kg/m.     05/02/2023    8:48 AM 05/08/2022    9:49 AM 09/30/2013    8:07 AM  Advanced Directives  Does Patient Have a Medical Advance Directive? Yes Yes   Type of Estate Agent of [place];Living will Living will   Copy of Healthcare Power of Attorney in Chart? No - copy requested    Pre-existing out of facility DNR order (yellow form or pink MOST form)   No    Current Medications (verified) Outpatient Encounter Medications as of 05/02/2023  Medication Sig   amitriptyline  (ELAVIL ) 50 MG tablet Take 1 tablet (50 mg total) by mouth at bedtime.   Baclofen  5 MG TABS Take 1 tablet (5 mg total) by mouth Nightly.   clotrimazole -betamethasone  (LOTRISONE ) cream Use as directed twice per day as needed   metoprolol  succinate (TOPROL -XL) 100 MG 24 hr tablet Take with or immediately following a meal.    neomycin -polymyxin-hydrocortisone (CORTISPORIN) OTIC solution Place 3 drops into the right ear 4 (four) times daily.   nystatin  (MYCOSTATIN /NYSTOP ) powder Apply 1 application topically 3 (three) times daily.   venlafaxine  XR (EFFEXOR -XR) 150 MG 24 hr capsule TAKE 1 CAPSULE BY MOUTH ONCE DAILY WITH BREAKFAST   No facility-administered encounter medications on file as of 05/02/2023.    Allergies (verified) Other and Shrimp [shellfish allergy]   History: Past Medical History:  Diagnosis Date   Anxiety    ASCUS of cervix with negative high risk HPV 06/2018   Chronic headaches    Depression    Diabetes mellitus, type 2 (HCC)    Diverticulosis    Gallstones    GERD    Hepatitis B    HYPERTENSION    OBESITY    RAYNAUDS SYNDROME    Seizures (HCC)    age 50 only one time   Stammering    Past Surgical History:  Procedure Laterality Date   CHOLECYSTECTOMY  2000   COLONOSCOPY     OTHER SURGICAL HISTORY  2000   ? if for diverticulosis, pt states they went through beely button   Family History  Problem Relation Age of Onset   Lung cancer Father    Allergies Brother    Liver disease Brother    Allergies Son    Lung cancer Maternal Aunt    Colon cancer Maternal Aunt    Clotting disorder Maternal Aunt    Diabetes Maternal Aunt    Pancreatic cancer Maternal Uncle    Diabetes Paternal Aunt    Heart disease  Paternal Uncle    Social History   Socioeconomic History   Marital status: Married    Spouse name: Metallurgist   Number of children: 1   Years of education: Not on file   Highest education level: Not on file  Occupational History   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  Tobacco Use   Smoking status: Never   Smokeless tobacco: Never  Vaping Use   Vaping status: Never Used  Substance and Sexual Activity   Alcohol use: No   Drug use: No   Sexual activity: Yes    Partners: Male    Birth control/protection: Post-menopausal    Comment: 1st intercourse 64 yo-5  partners  Other Topics Concern   Not on file  Social History Narrative   Out of work 06/2011    Social Drivers of Health   Financial Resource Strain: Low Risk  (05/02/2023)   Overall Financial Resource Strain (CARDIA)    Difficulty of Paying Living Expenses: Not hard at all  Food Insecurity: No Food Insecurity (05/02/2023)   Hunger Vital Sign    Worried About Running Out of Food in the Last Year: Never true    Ran Out of Food in the Last Year: Never true  Transportation Needs: No Transportation Needs (05/02/2023)   PRAPARE - Administrator, Civil Service (Medical): No    Lack of Transportation (Non-Medical): No  Physical Activity: Insufficiently Active (05/02/2023)   Exercise Vital Sign    Days of Exercise per Week: 2 days    Minutes of Exercise per Session: 30 min  Stress: Stress Concern Present (05/02/2023)   [employer] of Occupational Health - Occupational Stress Questionnaire    Feeling of Stress : Rather much  Social Connections: Socially Integrated (05/02/2023)   Social Connection and Isolation Panel [NHANES]    Frequency of Communication with Friends and Family: More than three times a week    Frequency of Social Gatherings with Friends and Family: Once a week    Attends Religious Services: More than 4 times per year    Active Member of [employer] or Organizations: Yes    Attends Banker Meetings: Never    Marital Status: Married    Tobacco Counseling Counseling given: Not Answered   Clinical Intake:  Pre-visit preparation completed: Yes  Pain : No/denies pain     BMI - recorded: 40.68 Nutritional Status: BMI > 30  Obese Nutritional Risks: None  How often do you need to have someone help you when you read instructions, pamphlets, or other written materials from your doctor or pharmacy?: 1 - Never  Interpreter Needed?: No      Activities of Daily Living    05/02/2023    8:20 AM 05/08/2022    9:54 AM  In your present state of health, do  you have any difficulty performing the following activities:  Hearing?  0  Vision? 0 0  Difficulty concentrating or making decisions? 0 0  Walking or climbing stairs? 0 0  Dressing or bathing? 0 0  Doing errands, shopping? 0 0  Comment Not at night   Preparing Food and eating ? N N  Using the Toilet? N N  In the past six months, have you accidently leaked urine? N N  Do you have problems with loss of bowel control? N N  Managing your Medications? N N  Managing your Finances? N N  Housekeeping or managing your Housekeeping? N N    Patient Care Team: Norleen Lynwood ORN,  MD as PCP - General (Internal Medicine) Austin Ned, MD as Referring Physician (Obstetrics and Gynecology) Aneita Gwendlyn DASEN, MD (Inactive) as Referring Physician (Gastroenterology) Shelah Lamar RAMAN, MD as Referring Physician (Pulmonary Disease) Raj Rankin SAUNDERS, MD as Referring Physician (Ophthalmology) [place], My [place], [place] as Referring Physician (Optometry)  Indicate any recent Medical Services you may have received from other than [facility] providers in the past year (date may be approximate).     Assessment:   This is a routine wellness examination for Sudiksha.  Hearing/Vision screen Hearing Screening - Comments:: Has some trouble Vision Screening - Comments:: Wears eyeglasses   Goals Addressed             This Visit's Progress    My goal for 2024 is to join Silver Sneakers Program at [employer].   On track     Depression Screen    05/02/2023    8:29 AM 04/09/2023   10:43 AM 10/09/2022   10:48 AM 05/08/2022    9:51 AM 04/12/2022   10:33 AM 12/16/2021    9:29 AM 12/16/2021    8:30 AM  PHQ 2/9 Scores  PHQ - 2 Score 2 0 0 0 0 0 0  PHQ- 9 Score 9   0 0 5     Fall Risk    05/02/2023    8:24 AM 04/09/2023   10:43 AM 10/09/2022   10:48 AM 05/08/2022    9:51 AM 04/12/2022   10:33 AM  Fall Risk   Falls in the past year? 0 0 0 0 0  Number falls in past yr: 0 0 0 0   Injury with Fall? 0 0 0 0 0  Risk for fall  due to : No Fall Risks No Fall Risks No Fall Risks No Fall Risks   Follow up Falls prevention discussed;Falls evaluation completed Falls evaluation completed Falls evaluation completed Falls prevention discussed     MEDICARE RISK AT HOME: Medicare Risk at Home Any stairs in or around the home?: Yes If so, are there any without handrails?: No Home free of loose throw rugs in walkways, pet beds, electrical cords, etc?: No Adequate lighting in your home to reduce risk of falls?: Yes Life alert?: No Use of a cane, walker or w/c?: No Grab bars in the bathroom?: No Shower chair or bench in shower?: No Elevated toilet seat or a handicapped toilet?: No  TIMED UP AND GO:  Was the test performed?  No    Cognitive Function:        05/02/2023    8:25 AM 05/08/2022    9:52 AM  6CIT Screen  What Year? 0 points 0 points  What month? 0 points 0 points  What time? 0 points 0 points  Count back from 20 0 points 0 points  Months in reverse 0 points 0 points  Repeat phrase 0 points 0 points  Total Score 0 points 0 points    Immunizations Immunization History  Administered Date(s) Administered   Fluad Quad(high Dose 65+) 12/31/2018, 04/12/2022   Fluad Trivalent(High Dose 65+) 04/09/2023   Influenza Whole 01/19/2010   Influenza, High Dose Seasonal PF 02/10/2021   Influenza,inj,Quad PF,6+ Mos 01/08/2018   Influenza-Unspecified 01/28/2020   PFIZER(Purple Top)SARS-COV-2 Vaccination 05/31/2019, 06/21/2019, 01/28/2020, 02/10/2021   PNEUMOCOCCAL CONJUGATE-20 04/09/2023   Pneumococcal Conjugate-13 04/01/2020   Pneumococcal Polysaccharide-23 08/23/2015, 12/31/2018   Tdap 06/26/2011    TDAP status: Due, Education has been provided regarding the importance of this vaccine. Advised may receive this  vaccine at local pharmacy or Health Dept. Aware to provide a copy of the vaccination record if obtained from local pharmacy or Health Dept. Verbalized acceptance and understanding.  Flu Vaccine status:  Up to date  Pneumococcal vaccine status: Up to date  Covid-19 vaccine status: Information provided on how to obtain vaccines.   Qualifies for Shingles Vaccine? Yes   Zostavax completed No   Shingrix Completed?: No.    Education has been provided regarding the importance of this vaccine. Patient has been advised to call insurance company to determine out of pocket expense if they have not yet received this vaccine. Advised may also receive vaccine at local pharmacy or Health Dept. Verbalized acceptance and understanding.  Screening Tests Health Maintenance  Topic Date Due   Zoster Vaccines- Shingrix (1 of 2) 08/22/2023 (Originally 11/01/2003)   Medicare Annual Wellness (AWV)  05/01/2024   MAMMOGRAM  11/01/2024   Colonoscopy  03/02/2026   Pneumonia Vaccine 61+ Years old  Completed   INFLUENZA VACCINE  Completed   DEXA SCAN  Completed   Hepatitis C Screening  Completed   HPV VACCINES  Aged Out   DTaP/Tdap/Td  Discontinued   COVID-19 Vaccine  Discontinued    Health Maintenance  There are no preventive care reminders to display for this patient.   Colorectal cancer screening: Type of screening: Colonoscopy. Completed 2017. Repeat every 10 years  Mammogram status: Completed 07/11/204. Repeat every year  Bone Density status: Completed 10/15/2018. Results reflect: Bone density results: NORMAL. Repeat every 2 years.  Lung Cancer Screening: (Low Dose CT Chest recommended if Age 74-80 years, 20 pack-year currently smoking OR have quit w/in 15years.) does not qualify.   Lung Cancer Screening Referral: NA  Additional Screening:  Hepatitis C Screening: does qualify; Completed 08/23/2015  Vision Screening: Recommended annual ophthalmology exams for early detection of glaucoma and other disorders of the eye. Is the patient up to date with their annual eye exam?  No  Who is the provider or what is the name of the office in which the patient attends annual eye exams? Dr. Raj If pt is  not established with a provider, would they like to be referred to a provider to establish care? No .   Dental Screening: Recommended annual dental exams for proper oral hygiene   Community Resource Referral / Chronic Care Management: CRR required this visit?  No   CCM required this visit?  No     Plan:     I have personally reviewed and noted the following in the patient's chart:   Medical and social history Use of alcohol, tobacco or illicit drugs  Current medications and supplements including opioid prescriptions. Patient is not currently taking opioid prescriptions. Functional ability and status Nutritional status Physical activity Advanced directives List of other physicians Hospitalizations, surgeries, and ER visits in previous 12 months Vitals Screenings to include cognitive, depression, and falls Referrals and appointments  In addition, I have reviewed and discussed with patient certain preventive protocols, quality metrics, and best practice recommendations. A written personalized care plan for preventive services as well as general preventive health recommendations were provided to patient.     Harlee Eckroth L Samuel Rittenhouse, CMA   05/02/2023   After Visit Summary: (MyChart) Due to this being a telephonic visit, the after visit summary with patients personalized plan was offered to patient via MyChart   Nurse Notes: Patient is due for a Shingrix vaccine and stated she will get that soon.  She also is due for an  eye exam, which she stated that she will schedule that today.  Patient is due for a DEXA, however she stated that she will hold off on order at this time.  Patient had no concerns to address today.

## 2023-05-02 NOTE — Patient Instructions (Signed)
 Ms. Bacus , Thank you for taking time to come for your Medicare Wellness Visit. I appreciate your ongoing commitment to your health goals. Please review the following plan we discussed and let me know if I can assist you in the future.   Referrals/Orders/Follow-Ups/Clinician Recommendations: You are due for a Shingles vaccine.  Remember to call and get scheduled for an eye exam.  It was nice talking to you this morning.  Keep up the good work.  This is a list of the screening recommended for you and due dates:  Health Maintenance  Topic Date Due   Zoster (Shingles) Vaccine (1 of 2) 08/22/2023*   Medicare Annual Wellness Visit  05/01/2024   Mammogram  11/01/2024   Colon Cancer Screening  03/02/2026   Pneumonia Vaccine  Completed   Flu Shot  Completed   DEXA scan (bone density measurement)  Completed   Hepatitis C Screening  Completed   HPV Vaccine  Aged Out   DTaP/Tdap/Td vaccine  Discontinued   COVID-19 Vaccine  Discontinued  *Topic was postponed. The date shown is not the original due date.    Advanced directives: (Copy Requested) Please bring a copy of your health care power of attorney and living will to the office to be added to your chart at your convenience.  Next Medicare Annual Wellness Visit scheduled for next year: Yes

## 2023-05-14 ENCOUNTER — Other Ambulatory Visit: Payer: Self-pay | Admitting: Internal Medicine

## 2023-05-14 ENCOUNTER — Other Ambulatory Visit: Payer: Self-pay

## 2023-05-14 DIAGNOSIS — I10 Essential (primary) hypertension: Secondary | ICD-10-CM

## 2023-07-02 DIAGNOSIS — H43813 Vitreous degeneration, bilateral: Secondary | ICD-10-CM | POA: Diagnosis not present

## 2023-07-02 DIAGNOSIS — H353132 Nonexudative age-related macular degeneration, bilateral, intermediate dry stage: Secondary | ICD-10-CM | POA: Diagnosis not present

## 2023-07-02 DIAGNOSIS — H2513 Age-related nuclear cataract, bilateral: Secondary | ICD-10-CM | POA: Diagnosis not present

## 2023-07-02 DIAGNOSIS — H35033 Hypertensive retinopathy, bilateral: Secondary | ICD-10-CM | POA: Diagnosis not present

## 2023-07-05 ENCOUNTER — Ambulatory Visit: Payer: Self-pay | Admitting: Internal Medicine

## 2023-07-05 ENCOUNTER — Ambulatory Visit (INDEPENDENT_AMBULATORY_CARE_PROVIDER_SITE_OTHER): Admitting: Nurse Practitioner

## 2023-07-05 VITALS — BP 126/80 | HR 76 | Temp 98.6°F | Ht 64.0 in | Wt 240.5 lb

## 2023-07-05 DIAGNOSIS — R300 Dysuria: Secondary | ICD-10-CM | POA: Diagnosis not present

## 2023-07-05 DIAGNOSIS — R102 Pelvic and perineal pain: Secondary | ICD-10-CM | POA: Diagnosis not present

## 2023-07-05 LAB — POCT URINALYSIS DIPSTICK
Bilirubin, UA: NEGATIVE
Blood, UA: POSITIVE
Glucose, UA: NEGATIVE
Ketones, UA: NEGATIVE
Leukocytes, UA: NEGATIVE
Nitrite, UA: NEGATIVE
Protein, UA: NEGATIVE
Spec Grav, UA: >=1.03 — AB (ref 1.010–1.025)
Urobilinogen, UA: 0.2 U/dL
pH, UA: 6 (ref 5.0–8.0)

## 2023-07-05 LAB — MICROALBUMIN / CREATININE URINE RATIO
Creatinine,U: 100.8 mg/dL
Microalb Creat Ratio: 7.8 mg/g (ref 0.0–30.0)
Microalb, Ur: 0.8 mg/dL (ref 0.0–1.9)

## 2023-07-05 LAB — AMB RESULTS CONSOLE CBG: Glucose: 82

## 2023-07-05 NOTE — Assessment & Plan Note (Addendum)
 Acute POC U/A: Positive for trace hemoglobin but otherwise negative. Send urine culture to lab, send nuswab, check for albuminuria, serum testing for syphilis/HIV/kidney function.  CT scan of pelvis ordered for further evaluation as well. Further recommendations may be made based on results.

## 2023-07-05 NOTE — Telephone Encounter (Signed)
   Chief Complaint: stomach ache Symptoms: dark green stool, pain  Disposition: [] ED /[] Urgent Care (no appt availability in office) / [x] Appointment(In office/virtual)/ []  [facility]/ [] Home Care/ [] Refused Recommended Disposition /[] [facility]/ []  Follow-up with PCP Additional Notes: Pt has several concerns to address. Pt has stomach pain that start "in middle to upper pelvic region that started " a while ago." Pt stated, " It cramps. My pee is dark yellow. No smell. My BM's have been dark green. My blood pressure has been up. Last time I checked it was 177/92." Pt denies any chest pain or SOB. Pt does have a "little headache here and there."  Pt has appt with NP Wallace Cullens at 1500 today. RN gave care advice and pt verbalized understanding.             Copied from CRM [id number]. Topic: Clinical - Red Word Triage >> Jul 05, 2023 11:22 AM [place] J wrote: [facility] that prompted transfer to Nurse Triage: 177/92 checked twice. / Stomach aching pain. / Possible UTI. Reason for Disposition  [1] MILD-MODERATE pain AND [2] constant AND [3] present > 2 hours  Answer Assessment - Initial Assessment Questions 1. LOCATION: "Where does it hurt?"      Middle to upper pelvic area 2. RADIATION: "Does the pain shoot anywhere else?" (e.g., chest, back)     Denies  3. ONSET: "When did the pain begin?" (e.g., minutes, hours or days ago)      "A while"  4. SUDDEN: "Gradual or sudden onset?"     Gradual  5. PATTERN "Does the pain come and go, or is it constant?"    - If it comes and goes: "How long does it last?" "Do you have pain now?"     (Note: Comes and goes means the pain is intermittent. It goes away completely between bouts.)    - If constant: "Is it getting better, staying the same, or getting worse?"      (Note: Constant means the pain never goes away completely; most serious pain is constant and gets worse.)      Comes and goes  6. SEVERITY: "How bad is the pain?"  (e.g.,  Scale 1-10; mild, moderate, or severe)    - MILD (1-3): Doesn't interfere with normal activities, abdomen soft and not tender to touch.     - MODERATE (4-7): Interferes with normal activities or awakens from sleep, abdomen tender to touch.     - SEVERE (8-10): Excruciating pain, doubled over, unable to do any normal activities.       5-6 7. RECURRENT SYMPTOM: "Have you ever had this type of stomach pain before?" If Yes, ask: "When was the last time?" and "What happened that time?"      No  8. CAUSE: "What do you think is causing the stomach pain?"     Not sure  9. RELIEVING/AGGRAVATING FACTORS: "What makes it better or worse?" (e.g., antacids, bending or twisting motion, bowel movement)     Pepto has helped  10. OTHER SYMPTOMS: "Do you have any other symptoms?" (e.g., back pain, diarrhea, fever, urination pain, vomiting)       Elevated BP, stool is dark green  Protocols used: Abdominal Pain - Female-A-AH

## 2023-07-05 NOTE — Progress Notes (Signed)
 Established Patient Office Visit  Subjective   Patient ID: Suzanne [name], female    DOB: [birth year]  Age: 68 y.o. MRN: [id number]  Chief Complaint  Patient presents with   Abdominal Pain    Lower abdominal pain, some burning sensation, been going on for about about month     Patient arrives for acute visit for the above.   Has had lower abdominal/pelvic pain x 1 month. Associated with dysuria. No hematuria, no fever, no persistent diarrhea/vomiting. Pain is 6/10 and comes and goes but mostly associated with urination. Endorses recent new sexual partner, but feels unlikely she was exposed to STD. Denies vaginal bleeding.     Review of Systems  Constitutional:  Negative for chills and fever.  Gastrointestinal:  Positive for abdominal pain. Negative for diarrhea, nausea and vomiting.  Genitourinary:  Positive for dysuria.      Objective:     BP 126/80   Pulse 76   Temp 98.6 F (37 C) (Temporal)   Ht 5\' 4"  (1.626 m)   Wt 240 lb 8 oz (109.1 kg)   SpO2 98%   BMI 41.28 kg/m  BP Readings from Last 3 Encounters:  07/05/23 126/80  07/05/23 (!) 177/95  04/09/23 120/78   Wt Readings from Last 3 Encounters:  07/05/23 240 lb 8 oz (109.1 kg)  05/02/23 237 lb (107.5 kg)  04/09/23 237 lb (107.5 kg)      Physical Exam Vitals reviewed.  Constitutional:      General: She is not in acute distress.    Appearance: Normal appearance.  HENT:     Head: Normocephalic and atraumatic.  Neck:     Vascular: No carotid bruit.  Cardiovascular:     Rate and Rhythm: Normal rate and regular rhythm.     Pulses: Normal pulses.     Heart sounds: Normal heart sounds.  Pulmonary:     Effort: Pulmonary effort is normal.     Breath sounds: Normal breath sounds.  Skin:    General: Skin is warm and dry.  Neurological:     General: No focal deficit present.     Mental Status: She is alert and oriented to person, place, and time.  Psychiatric:        Mood and Affect: Mood normal.         Behavior: Behavior normal.        Judgment: Judgment normal.      Results for orders placed or performed in visit on 07/05/23  POCT urinalysis dipstick  Result Value Ref Range   Color, UA     Clarity, UA     Glucose, UA Negative Negative   Bilirubin, UA negative    Ketones, UA negative    Spec Grav, UA >=1.030 (A) 1.010 - 1.025   Blood, UA positive    pH, UA 6.0 5.0 - 8.0   Protein, UA Negative Negative   Urobilinogen, UA 0.2 0.2 or 1.0 E.U./dL   Nitrite, UA negative    Leukocytes, UA Negative Negative   Appearance     Odor    Results for orders placed or performed in visit on 07/05/23  CBG  Result Value Ref Range   Glucose 82       The 10-year ASCVD risk score (Arnett DK, et al., 2019) is: 8.9%    Assessment & Plan:   Problem List Items Addressed This Visit       Other   Dysuria - Primary   Acute POC U/A: Positive  for trace hemoglobin but otherwise negative. Send urine culture to lab, send nuswab, check for albuminuria, serum testing for syphilis/HIV/kidney function.  CT scan of pelvis ordered for further evaluation as well. Further recommendations may be made based on results.       Relevant Orders   POCT urinalysis dipstick (Completed)   Urine Culture   NuSwab Vaginitis Plus (VG+)   RPR   HIV Antibody (routine testing w rflx)   Basic metabolic panel   Urinalysis, Routine w reflex microscopic   Microalbumin / creatinine urine ratio   Pelvic pain   Acute, etiology unclear Labs ordered for further evaluation, will also order CT scan of pelvis for further evaluation.  Further recommendations may be made based upon these results.      Relevant Orders   CT ABDOMEN PELVIS WO CONTRAST    Return if symptoms worsen or fail to improve.    Elenore Paddy, NP

## 2023-07-05 NOTE — Progress Notes (Signed)
 Patient was screened for HTN, non fasting blood sugar, SDOH screening. Her blood pressure was found to be elevated times two. She denied CP, SOB, HA and/or visual changes. She stated she just completed exercise program. Educated her on lifestyle improvement and educational material provided. She agreed to contact PCP for a follow up appointment today.

## 2023-07-05 NOTE — Assessment & Plan Note (Signed)
 Acute, etiology unclear Labs ordered for further evaluation, will also order CT scan of pelvis for further evaluation.  Further recommendations may be made based upon these results.

## 2023-07-06 ENCOUNTER — Encounter: Payer: Self-pay | Admitting: Nurse Practitioner

## 2023-07-06 ENCOUNTER — Ambulatory Visit: Admitting: Internal Medicine

## 2023-07-06 LAB — URINALYSIS, ROUTINE W REFLEX MICROSCOPIC
Bilirubin Urine: NEGATIVE
Hgb urine dipstick: NEGATIVE
Ketones, ur: NEGATIVE
Leukocytes,Ua: NEGATIVE
Nitrite: NEGATIVE
Specific Gravity, Urine: 1.025 (ref 1.000–1.030)
Total Protein, Urine: NEGATIVE
Urine Glucose: NEGATIVE
Urobilinogen, UA: 0.2 (ref 0.0–1.0)
pH: 6 (ref 5.0–8.0)

## 2023-07-06 LAB — URINE CULTURE

## 2023-07-09 LAB — NUSWAB VAGINITIS PLUS (VG+)
Candida albicans, NAA: NEGATIVE
Candida glabrata, NAA: NEGATIVE
Chlamydia trachomatis, NAA: NEGATIVE
Neisseria gonorrhoeae, NAA: NEGATIVE
Trich vag by NAA: NEGATIVE

## 2023-07-26 ENCOUNTER — Encounter: Payer: Self-pay | Admitting: Nurse Practitioner

## 2023-07-29 ENCOUNTER — Encounter (HOSPITAL_COMMUNITY): Payer: Self-pay

## 2023-07-29 ENCOUNTER — Ambulatory Visit (HOSPITAL_COMMUNITY)
Admission: EM | Admit: 2023-07-29 | Discharge: 2023-07-29 | Disposition: A | Discharge status: Home / Self Care | Attending: Emergency Medicine | Admitting: Emergency Medicine

## 2023-07-29 DIAGNOSIS — S61209A Unspecified open wound of unspecified finger without damage to nail, initial encounter: Secondary | ICD-10-CM | POA: Diagnosis not present

## 2023-07-29 DIAGNOSIS — Z203 Contact with and (suspected) exposure to rabies: Secondary | ICD-10-CM | POA: Diagnosis not present

## 2023-07-29 MED ORDER — TETANUS-DIPHTH-ACELL PERTUSSIS 5-2.5-18.5 LF-MCG/0.5 IM SUSY
PREFILLED_SYRINGE | INTRAMUSCULAR | Status: AC
  Filled 2023-07-29: qty 0.5

## 2023-07-29 MED ORDER — TETANUS-DIPHTH-ACELL PERTUSSIS 5-2.5-18.5 LF-MCG/0.5 IM SUSY
0.5000 mL | PREFILLED_SYRINGE | Freq: Once | INTRAMUSCULAR | Status: AC
  Administered 2023-07-29: 0.5 mL via INTRAMUSCULAR

## 2023-07-29 NOTE — Discharge Instructions (Addendum)
 You can wash normally with mild soap and water I recommend antibiotic ointment twice daily Change dressing at least once daily You can use non-stick gauze and then coban wrap Or a bandaid if this is easier!  Tylenol for pain if needed  Monitor for any signs of infection - increased pain, redness, swelling, or foul drainage. Please return if needed  We have updated your tetanus vaccine today

## 2023-07-29 NOTE — ED Provider Notes (Signed)
 [facility]    CSN: [id number] Arrival date & time: 07/29/23  1324     History   Chief Complaint Chief Complaint  Patient presents with   Laceration    HPI Glenis Musolf Grandinetti is a 68 y.o. female.  Here with left hand middle finger injury that occurred just prior to arrival She was using a can opener, it slipped and cut the tip of her finger.  Reports a lot of bleeding initially.  She does not take blood thinner.  Pain currently rated 5 out of 10 Last tetanus per chart was 2013  Past Medical History:  Diagnosis Date   Anxiety    ASCUS of cervix with negative high risk HPV 06/2018   Chronic headaches    Depression    Diabetes mellitus, type 2 (HCC)    Diverticulosis    Gallstones    GERD    Hepatitis B    HYPERTENSION    OBESITY    RAYNAUDS SYNDROME    Seizures (HCC)    age 66 only one time   Stammering     Patient Active Problem List   Diagnosis Date Noted   Dysuria 07/05/2023   Pelvic pain 07/05/2023   Pain of left breast 10/12/2022   HLD (hyperlipidemia) 04/12/2022   Laceration of left thumb 12/16/2021   Neck pain 12/16/2021   Acute mid back pain 12/16/2021   Multiple leg contusions 12/16/2021   B12 deficiency 10/16/2021   Vitamin D deficiency 04/10/2021   External otitis of right ear 04/10/2021   Hidradenitis suppurativa 05/15/2018   Right arm pain 04/01/2018   Recurrent boils 04/01/2018   Tinea pedis 08/10/2016   Hematochezia 11/03/2015   Abdominal pain 11/03/2015   Encounter for well adult exam with abnormal findings 08/23/2015   Paresthesia 11/02/2014   RLQ abdominal pain 10/28/2014   Adult stuttering    Anxiety 11/23/2010   RESTLESS LEG SYNDROME 01/19/2010   POSTMENOPAUSAL STATUS 10/08/2009   OBESITY 07/21/2009   RAYNAUDS SYNDROME 07/21/2009   GERD 07/07/2009   Hypertension, uncontrolled 01/13/2009   Pre-diabetes 01/13/2009    Past Surgical History:  Procedure Laterality Date   CHOLECYSTECTOMY  2000   COLONOSCOPY      OTHER SURGICAL HISTORY  2000   ? if for diverticulosis, pt states they went through beely button    OB History     Gravida  3   Para  1   Term      Preterm      AB  2   Living  1      SAB  2   IAB      Ectopic      Multiple      Live Births               Home Medications    Prior to Admission medications   Medication Sig Start Date End Date Taking? Authorizing Provider  amitriptyline (ELAVIL) 50 MG tablet Take 1 tablet (50 mg total) by mouth at bedtime. 04/12/22   Corwin Levins, MD  Baclofen 5 MG TABS Take 1 tablet (5 mg total) by mouth Nightly. 03/06/23   Hyrum Shaneyfelt, Lurena Joiner, PA-C  clotrimazole-betamethasone (LOTRISONE) cream Use as directed twice per day as needed 07/01/19   Corwin Levins, MD  metoprolol succinate (TOPROL-XL) 100 MG 24 hr tablet TAKE 1 TABLET BY MOUTH ONCE DAILY WITH  OR  IMMEDIATELY  FOLLOWING  A  MEAL 05/14/23   Corwin Levins, MD  neomycin-polymyxin-hydrocortisone (CORTISPORIN)  OTIC solution Place 3 drops into the right ear 4 (four) times daily. 10/09/22   Corwin Levins, MD  nystatin (MYCOSTATIN/NYSTOP) powder Apply 1 application topically 3 (three) times daily. 08/05/20   Olivia Mackie, NP  venlafaxine XR (EFFEXOR-XR) 150 MG 24 hr capsule TAKE 1 CAPSULE BY MOUTH ONCE DAILY WITH BREAKFAST 12/14/22   Corwin Levins, MD    Family History Family History  Problem Relation Age of Onset   Lung cancer Father    Allergies Brother    Liver disease Brother    Allergies Son    Lung cancer Maternal Aunt    Colon cancer Maternal Aunt    Clotting disorder Maternal Aunt    Diabetes Maternal Aunt    Pancreatic cancer Maternal Uncle    Diabetes Paternal Aunt    Heart disease Paternal Uncle     Social History Social History   Tobacco Use   Smoking status: Never   Smokeless tobacco: Never  Vaping Use   Vaping status: Never Used  Substance Use Topics   Alcohol use: No   Drug use: No     Allergies   Other and Shrimp [shellfish  allergy]   Review of Systems Review of Systems Per HPI  Physical Exam Triage Vital Signs ED Triage Vitals  Encounter Vitals Group     BP 07/29/23 1451 136/71     Systolic BP Percentile --      Diastolic BP Percentile --      Pulse Rate 07/29/23 1451 66     Resp 07/29/23 1451 16     Temp 07/29/23 1451 97.6 F (36.4 C)     Temp Source 07/29/23 1451 Oral     SpO2 07/29/23 1451 100 %     Weight --      Height --      Head Circumference --      Peak Flow --      Pain Score 07/29/23 1454 5     Pain Loc --      Pain Education --      Exclude from Growth Chart --    No data found.  Updated Vital Signs BP 136/71 (BP Location: Right Arm)   Pulse 66   Temp 97.6 F (36.4 C) (Oral)   Resp 16   SpO2 100%    Physical Exam Vitals and nursing note reviewed.  Constitutional:      General: She is not in acute distress.    Appearance: Normal appearance.  HENT:     Mouth/Throat:     Pharynx: Oropharynx is clear.  Cardiovascular:     Rate and Rhythm: Normal rate and regular rhythm.     Pulses: Normal pulses.     Heart sounds: Normal heart sounds.  Pulmonary:     Effort: Pulmonary effort is normal.     Breath sounds: Normal breath sounds.  Abdominal:     Palpations: Abdomen is soft.  Skin:    Findings: Wound present.     Comments: There is a small triangle shaped skin avulsion on the pad of left hand middle finger.  Not currently bleeding.  Distal sensation intact, cap refill 1 second  Neurological:     Mental Status: She is alert and oriented to person, place, and time.     UC Treatments / Results  Labs (all labs ordered are listed, but only abnormal results are displayed) Labs Reviewed - No data to display  EKG  Radiology No results found.  Procedures Procedures (  including critical care time)  Medications Ordered in UC Medications  Tdap (BOOSTRIX) injection 0.5 mL (0.5 mLs Intramuscular Given 07/29/23 1534)    Initial Impression / Assessment and Plan / UC  Course  I have reviewed the triage vital signs and the nursing notes.  Pertinent labs & imaging results that were available during my care of the patient were reviewed by me and considered in my medical decision making (see chart for details).  Small skin avulsion of left hand middle finger.  Discussed with patient.  Wound care in clinic by this provider and bandage applied.  Advised keeping clean at home, monitoring for signs of infection, pain control.  Tetanus vaccine is updated today.  Patient agrees to plan, no questions  Final Clinical Impressions(s) / UC Diagnoses   Final diagnoses:  Avulsion of skin of finger, initial encounter     Discharge Instructions      You can wash normally with mild soap and water I recommend antibiotic ointment twice daily Change dressing at least once daily You can use non-stick gauze and then coban wrap Or a bandaid if this is easier!  Tylenol for pain if needed  Monitor for any signs of infection - increased pain, redness, swelling, or foul drainage. Please return if needed  We have updated your tetanus vaccine today    ED Prescriptions   None    PDMP not reviewed this encounter.   Marlow Baars, [place] 07/29/23 [id number]

## 2023-07-29 NOTE — ED Triage Notes (Signed)
 Patient states she was opening a can of greens beans and cut her left middle finger.

## 2023-07-30 ENCOUNTER — Ambulatory Visit
Admission: RE | Admit: 2023-07-30 | Discharge: 2023-07-30 | Disposition: A | Discharge status: Home / Self Care | Source: Ambulatory Visit | Attending: Nurse Practitioner | Admitting: Nurse Practitioner

## 2023-07-30 DIAGNOSIS — R102 Pelvic and perineal pain: Secondary | ICD-10-CM

## 2023-08-02 ENCOUNTER — Encounter: Payer: Self-pay | Admitting: Nurse Practitioner

## 2023-08-10 ENCOUNTER — Other Ambulatory Visit: Payer: Self-pay | Admitting: Internal Medicine

## 2023-08-10 DIAGNOSIS — I10 Essential (primary) hypertension: Secondary | ICD-10-CM

## 2023-08-13 ENCOUNTER — Other Ambulatory Visit: Payer: Self-pay

## 2023-08-13 NOTE — Progress Notes (Signed)
 The patient attended a screening event on 07/05/2023, where her blood pressure was measured at 177/95 mmHg after a second check, and her blood glucose was 82 mg/dl. During the event, the patient reported that she does not smoke, has Medicare and Private/VA insurance, is established with a primary care provider (Dr. Rosalia Colonel), and she did not indicate any SDOH needs.   A chart review confirmed that the patient does have an active PCP. Dr. Rosalia Colonel - [facility].The patient's most recent office visit with her PCP was on 04/09/2023 and she has a follow up appointment on 10/08/2023 at 10:40 AM and another on 05/01/2024 at 9:30 AM. She recently had a refill encounter on her blood pressure medication on 08/10/23, the pt's PCP is currently addressing her bp.   At this time, no additional support from the [facility] Team is indicated.

## 2023-10-08 ENCOUNTER — Ambulatory Visit: Payer: Medicare PPO | Admitting: Internal Medicine

## 2023-10-12 ENCOUNTER — Ambulatory Visit: Payer: Self-pay | Admitting: Internal Medicine

## 2023-10-12 ENCOUNTER — Ambulatory Visit (INDEPENDENT_AMBULATORY_CARE_PROVIDER_SITE_OTHER): Admitting: Internal Medicine

## 2023-10-12 VITALS — BP 138/78 | HR 63 | Temp 98.0°F | Ht 64.0 in | Wt 241.1 lb

## 2023-10-12 DIAGNOSIS — I10 Essential (primary) hypertension: Secondary | ICD-10-CM | POA: Diagnosis not present

## 2023-10-12 DIAGNOSIS — F419 Anxiety disorder, unspecified: Secondary | ICD-10-CM

## 2023-10-12 DIAGNOSIS — R7303 Prediabetes: Secondary | ICD-10-CM | POA: Diagnosis not present

## 2023-10-12 DIAGNOSIS — E559 Vitamin D deficiency, unspecified: Secondary | ICD-10-CM

## 2023-10-12 DIAGNOSIS — Z0001 Encounter for general adult medical examination with abnormal findings: Secondary | ICD-10-CM

## 2023-10-12 DIAGNOSIS — E538 Deficiency of other specified B group vitamins: Secondary | ICD-10-CM | POA: Diagnosis not present

## 2023-10-12 DIAGNOSIS — Z0000 Encounter for general adult medical examination without abnormal findings: Secondary | ICD-10-CM | POA: Diagnosis not present

## 2023-10-12 DIAGNOSIS — E7800 Pure hypercholesterolemia, unspecified: Secondary | ICD-10-CM

## 2023-10-12 LAB — LIPID PANEL
Cholesterol: 153 mg/dL (ref 0–200)
HDL: 42.9 mg/dL (ref >39.00)
LDL Cholesterol: 99 mg/dL (ref 0–99)
NonHDL: 109.87
Total CHOL/HDL Ratio: 4
Triglycerides: 55 mg/dL (ref 0.0–149.0)
VLDL: 11 mg/dL (ref 0.0–40.0)

## 2023-10-12 LAB — CBC WITH DIFFERENTIAL/PLATELET
Basophils Absolute: 0.1 10*3/uL (ref 0.0–0.1)
Basophils Relative: 1.4 % (ref 0.0–3.0)
Eosinophils Absolute: 0.1 10*3/uL (ref 0.0–0.7)
Eosinophils Relative: 2.9 % (ref 0.0–5.0)
HCT: 37.9 % (ref 36.0–46.0)
Hemoglobin: 12.3 g/dL (ref 12.0–15.0)
Lymphocytes Relative: 28.9 % (ref 12.0–46.0)
Lymphs Abs: 1.3 10*3/uL (ref 0.7–4.0)
MCHC: 32.5 g/dL (ref 30.0–36.0)
MCV: 86.6 fl (ref 78.0–100.0)
Monocytes Absolute: 0.3 10*3/uL (ref 0.1–1.0)
Monocytes Relative: 7.4 % (ref 3.0–12.0)
Neutro Abs: 2.6 10*3/uL (ref 1.4–7.7)
Neutrophils Relative %: 59.4 % (ref 43.0–77.0)
Platelets: 280 10*3/uL (ref 150.0–400.0)
RBC: 4.38 Mil/uL (ref 3.87–5.11)
RDW: 15.2 % (ref 11.5–15.5)
WBC: 4.4 10*3/uL (ref 4.0–10.5)

## 2023-10-12 LAB — BASIC METABOLIC PANEL WITH GFR
BUN: 15 mg/dL (ref 6–23)
CO2: 31 meq/L (ref 19–32)
Calcium: 9.4 mg/dL (ref 8.4–10.5)
Chloride: 105 meq/L (ref 96–112)
Creatinine, Ser: 0.77 mg/dL (ref 0.40–1.20)
GFR: 78.42 mL/min (ref >60.00)
Glucose, Bld: 88 mg/dL (ref 70–99)
Potassium: 5.1 meq/L (ref 3.5–5.1)
Sodium: 142 meq/L (ref 135–145)

## 2023-10-12 LAB — URINALYSIS, ROUTINE W REFLEX MICROSCOPIC
Bilirubin Urine: NEGATIVE
Hgb urine dipstick: NEGATIVE
Ketones, ur: NEGATIVE
Leukocytes,Ua: NEGATIVE
Nitrite: NEGATIVE
Specific Gravity, Urine: 1.02 (ref 1.000–1.030)
Total Protein, Urine: NEGATIVE
Urine Glucose: NEGATIVE
Urobilinogen, UA: 0.2 (ref 0.0–1.0)
pH: 6 (ref 5.0–8.0)

## 2023-10-12 LAB — HEPATIC FUNCTION PANEL
ALT: 11 U/L (ref 0–35)
AST: 16 U/L (ref 0–37)
Albumin: 3.9 g/dL (ref 3.5–5.2)
Alkaline Phosphatase: 91 U/L (ref 39–117)
Bilirubin, Direct: 0 mg/dL (ref 0.0–0.3)
Total Bilirubin: 0.3 mg/dL (ref 0.2–1.2)
Total Protein: 7.1 g/dL (ref 6.0–8.3)

## 2023-10-12 LAB — HEMOGLOBIN A1C: Hgb A1c MFr Bld: 6.5 % (ref 4.6–6.5)

## 2023-10-12 LAB — VITAMIN B12: Vitamin B-12: 310 pg/mL (ref 211–911)

## 2023-10-12 LAB — VITAMIN D 25 HYDROXY (VIT D DEFICIENCY, FRACTURES): VITD: 34.08 ng/mL (ref 30.00–100.00)

## 2023-10-12 LAB — TSH: TSH: 1.61 u[IU]/mL (ref 0.35–5.50)

## 2023-10-12 MED ORDER — METOPROLOL SUCCINATE ER 100 MG PO TB24: ORAL_TABLET | ORAL | 3 refills | Status: DC

## 2023-10-12 MED ORDER — VENLAFAXINE HCL ER 150 MG PO CP24: ORAL_CAPSULE | ORAL | 3 refills | Status: DC

## 2023-10-12 MED ORDER — AMITRIPTYLINE HCL 50 MG PO TABS: 50.0000 mg | ORAL_TABLET | Freq: Every day | ORAL | 1 refills | Status: DC

## 2023-10-12 NOTE — Progress Notes (Unsigned)
 Chief Complaint:: wellness exam and No chief complaint on file.  ***       HPI:  Suzanne [name] is a 68 y.o. female here for wellness exam                        Also***     Wt Readings from Last 3 Encounters:  10/12/23 241 lb 2 oz (109.4 kg)  07/05/23 240 lb 8 oz (109.1 kg)  05/02/23 237 lb (107.5 kg)   BP Readings from Last 3 Encounters:  10/12/23 138/78  07/29/23 136/71  07/05/23 126/80   Immunization History  Administered Date(s) Administered   Fluad Quad(high Dose 65+) 12/31/2018, 04/12/2022   Fluad Trivalent(High Dose 65+) 04/09/2023   Influenza Whole 01/19/2010   Influenza, High Dose Seasonal PF 02/10/2021   Influenza,inj,Quad PF,6+ Mos 01/08/2018   Influenza-Unspecified 01/28/2020   PFIZER(Purple Top)SARS-COV-2 Vaccination 05/31/2019, 06/21/2019, 01/28/2020, 02/10/2021   PNEUMOCOCCAL CONJUGATE-20 04/09/2023   Pneumococcal Conjugate-13 04/01/2020   Pneumococcal Polysaccharide-23 08/23/2015, 12/31/2018   Tdap 06/26/2011, 07/29/2023   Health Maintenance Due  Topic Date Due   Zoster Vaccines- Shingrix (1 of 2) Never done      Past Medical History:  Diagnosis Date   Anxiety    ASCUS of cervix with negative high risk HPV 06/2018   Chronic headaches    Depression    Diabetes mellitus, type 2 (HCC)    Diverticulosis    Gallstones    GERD    Hepatitis B    HYPERTENSION    OBESITY    RAYNAUDS SYNDROME    Seizures (HCC)    age 35 only one time   Stammering    Past Surgical History:  Procedure Laterality Date   CHOLECYSTECTOMY  2000   COLONOSCOPY     OTHER SURGICAL HISTORY  2000   ? if for diverticulosis, pt states they went through beely button    reports that she has never smoked. She has never used smokeless tobacco. She reports that she does not drink alcohol and does not use drugs. family history includes Allergies in her brother and son; Clotting disorder in her maternal aunt; Colon cancer in her maternal aunt; Diabetes in her  maternal aunt and paternal aunt; Heart disease in her paternal uncle; Liver disease in her brother; Lung cancer in her father and maternal aunt; Pancreatic cancer in her maternal uncle. Allergies  Allergen Reactions   Other Swelling    ALL SEAFOOD   Shrimp [Shellfish Allergy] Swelling    Pt states she allergic to seafood   Current Outpatient Medications on File Prior to Visit  Medication Sig Dispense Refill   amitriptyline  (ELAVIL ) 50 MG tablet Take 1 tablet (50 mg total) by mouth at bedtime. 90 tablet 1   Baclofen  5 MG TABS Take 1 tablet (5 mg total) by mouth Nightly. 10 tablet 0   clotrimazole -betamethasone  (LOTRISONE ) cream Use as directed twice per day as needed 45 g 1   metoprolol  succinate (TOPROL -XL) 100 MG 24 hr tablet TAKE 1 TABLET BY MOUTH ONCE DAILY WITH MEALS OR  IMMEDIATELY  FOLLOWING  A  MEAL 90 tablet 3   neomycin -polymyxin-hydrocortisone (CORTISPORIN) OTIC solution Place 3 drops into the right ear 4 (four) times daily. 10 mL 0   nystatin  (MYCOSTATIN /NYSTOP ) powder Apply 1 application topically 3 (three) times daily. 15 g 2   venlafaxine  XR (EFFEXOR -XR) 150 MG 24 hr capsule TAKE 1 CAPSULE BY MOUTH ONCE DAILY WITH BREAKFAST  90 capsule 3   No current facility-administered medications on file prior to visit.        ROS:  All others reviewed and negative.  Objective        PE:  BP 138/78   Pulse 63   Temp 98 F (36.7 C) (Temporal)   Ht 5' 4 (1.626 m)   Wt 241 lb 2 oz (109.4 kg)   SpO2 99%   BMI 41.39 kg/m                 Constitutional: Pt appears in NAD               HENT: Head: NCAT.                Right Ear: External ear normal.                 Left Ear: External ear normal.                Eyes: . Pupils are equal, round, and reactive to light. Conjunctivae and EOM are normal               Nose: without d/c or deformity               Neck: Neck supple. Gross normal ROM               Cardiovascular: Normal rate and regular rhythm.                  Pulmonary/Chest: Effort normal and breath sounds without rales or wheezing.                Abd:  Soft, NT, ND, + BS, no organomegaly               Neurological: Pt is alert. At baseline orientation, motor grossly intact               Skin: Skin is warm. No rashes, no other new lesions, LE edema - ***               Psychiatric: Pt behavior is normal without agitation   Micro: none  Cardiac tracings I have personally interpreted today:  none  Pertinent Radiological findings (summarize): none   Lab Results  Component Value Date   WBC 4.4 10/09/2022   HGB 12.9 10/09/2022   HCT 40.5 10/09/2022   PLT 299.0 10/09/2022   GLUCOSE 89 10/09/2022   CHOL 159 10/09/2022   TRIG 84.0 10/09/2022   HDL 43.60 10/09/2022   LDLCALC 99 10/09/2022   ALT 12 10/09/2022   AST 19 10/09/2022   NA 139 10/09/2022   K 4.2 10/09/2022   CL 102 10/09/2022   CREATININE 0.85 10/09/2022   BUN 11 10/09/2022   CO2 31 10/09/2022   TSH 1.74 10/09/2022   HGBA1C 6.1 10/09/2022   MICROALBUR 0.8 07/05/2023   Assessment/Plan:  Suzanne [name] is a 68 y.o. Black or African American [2] female with  has a past medical history of Anxiety, ASCUS of cervix with negative high risk HPV (06/2018), Chronic headaches, Depression, Diabetes mellitus, type 2 (HCC), Diverticulosis, Gallstones, GERD, Hepatitis B, HYPERTENSION, OBESITY, RAYNAUDS SYNDROME, Seizures (HCC), and Stammering.  No problem-specific Assessment & Plan notes found for this encounter.  Followup: No follow-ups on file.  Rosalia Colonel, MD 10/12/2023 11:08 AM [facility]

## 2023-10-12 NOTE — Patient Instructions (Addendum)
 Please have your Shingrix (shingles) shots done at your local pharmacy.  Please continue all other medications as before, and refills have been done if requested.  Please have the pharmacy call with any other refills you may need.  Please continue your efforts at being more active, low cholesterol diet, and weight control.  You are otherwise up to date with prevention measures today.  Please keep your appointments with your specialists as you may have planned  You will be contacted regarding the referral for: counseling  Please go to the LAB at the blood drawing area for the tests to be done  You will be contacted by phone if any changes need to be made immediately.  Otherwise, you will receive a letter about your results with an explanation, but please check with MyChart first.  Please make an Appointment to return after Apr 24 2024, or sooner if needed

## 2023-10-12 NOTE — Progress Notes (Signed)
 The test results show that your current treatment is OK, as the tests are stable.  Please continue the same plan.  There is no other need for change of treatment or further evaluation based on these results, at this time.  thanks

## 2023-10-14 ENCOUNTER — Encounter: Payer: Self-pay | Admitting: Internal Medicine

## 2023-10-14 NOTE — Assessment & Plan Note (Signed)
 Age and sex appropriate education and counseling updated with regular exercise and diet Referrals for preventative services - none needed Immunizations addressed - for shingrix at pharmacy Smoking counseling  - none needed Evidence for depression or other mood disorder - anxiety with driving phobia - for counseling referral Most recent labs reviewed. I have personally reviewed and have noted: 1) the patient's medical and social history 2) The patient's current medications and supplements 3) The patient's height, weight, and BMI have been recorded in the chart
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
## 2023-10-14 NOTE — Assessment & Plan Note (Signed)
 Lab Results  Component Value Date   HGBA1C 6.5 10/12/2023   Stable, pt to continue current medical treatment  - diet,wt control

## 2023-10-14 NOTE — Assessment & Plan Note (Signed)
 Lab Results  Component Value Date   LDLCALC 99 10/12/2023   Stable, pt to continue current statin  - diet,w t control

## 2023-10-14 NOTE — Assessment & Plan Note (Signed)
 Lab Results  Component Value Date   VITAMINB12 310 10/12/2023   Stable, cont oral replacement - b12 1000 mcg qd

## 2023-10-14 NOTE — Assessment & Plan Note (Signed)
 Last vitamin D  Lab Results  Component Value Date   VD25OH 34.08 10/12/2023   Low, to start oral replacement

## 2023-10-14 NOTE — Assessment & Plan Note (Signed)
 BP Readings from Last 3 Encounters:  10/12/23 138/78  07/29/23 136/71  07/05/23 126/80   Stable, pt to continue medical treatment toprol  xl 100 qd

## 2023-11-08 DIAGNOSIS — H2513 Age-related nuclear cataract, bilateral: Secondary | ICD-10-CM | POA: Diagnosis not present

## 2023-11-08 DIAGNOSIS — H40033 Anatomical narrow angle, bilateral: Secondary | ICD-10-CM | POA: Diagnosis not present

## 2023-12-01 ENCOUNTER — Other Ambulatory Visit: Payer: Self-pay | Admitting: Internal Medicine

## 2023-12-01 ENCOUNTER — Ambulatory Visit
Admission: RE | Admit: 2023-12-01 | Discharge: 2023-12-01 | Disposition: A | Discharge status: Home / Self Care | Source: Ambulatory Visit | Attending: Internal Medicine | Admitting: Internal Medicine

## 2023-12-01 DIAGNOSIS — Z1231 Encounter for screening mammogram for malignant neoplasm of breast: Secondary | ICD-10-CM

## 2024-04-30 ENCOUNTER — Ambulatory Visit: Admitting: Internal Medicine

## 2024-04-30 ENCOUNTER — Encounter: Payer: Self-pay | Admitting: Internal Medicine

## 2024-04-30 ENCOUNTER — Ambulatory Visit: Payer: Self-pay | Admitting: Internal Medicine

## 2024-04-30 VITALS — BP 138/76 | HR 75 | Temp 98.1°F | Ht 64.0 in | Wt 239.0 lb

## 2024-04-30 DIAGNOSIS — E538 Deficiency of other specified B group vitamins: Secondary | ICD-10-CM | POA: Diagnosis not present

## 2024-04-30 DIAGNOSIS — Z23 Encounter for immunization: Secondary | ICD-10-CM

## 2024-04-30 DIAGNOSIS — E7800 Pure hypercholesterolemia, unspecified: Secondary | ICD-10-CM

## 2024-04-30 DIAGNOSIS — I10 Essential (primary) hypertension: Secondary | ICD-10-CM | POA: Diagnosis not present

## 2024-04-30 DIAGNOSIS — E559 Vitamin D deficiency, unspecified: Secondary | ICD-10-CM | POA: Diagnosis not present

## 2024-04-30 DIAGNOSIS — R7303 Prediabetes: Secondary | ICD-10-CM | POA: Diagnosis not present

## 2024-04-30 DIAGNOSIS — F419 Anxiety disorder, unspecified: Secondary | ICD-10-CM

## 2024-04-30 LAB — CBC WITH DIFFERENTIAL/PLATELET
Basophils Absolute: 0.1 K/uL (ref 0.0–0.1)
Basophils Relative: 1.4 % (ref 0.0–3.0)
Eosinophils Absolute: 0.1 K/uL (ref 0.0–0.7)
Eosinophils Relative: 2 % (ref 0.0–5.0)
HCT: 38.4 % (ref 36.0–46.0)
Hemoglobin: 12.6 g/dL (ref 12.0–15.0)
Lymphocytes Relative: 22.7 % (ref 12.0–46.0)
Lymphs Abs: 0.8 K/uL (ref 0.7–4.0)
MCHC: 32.9 g/dL (ref 30.0–36.0)
MCV: 86.9 fl (ref 78.0–100.0)
Monocytes Absolute: 0.3 K/uL (ref 0.1–1.0)
Monocytes Relative: 8.7 % (ref 3.0–12.0)
Neutro Abs: 2.4 K/uL (ref 1.4–7.7)
Neutrophils Relative %: 65.2 % (ref 43.0–77.0)
Platelets: 234 K/uL (ref 150.0–400.0)
RBC: 4.43 Mil/uL (ref 3.87–5.11)
RDW: 15.4 % (ref 11.5–15.5)
WBC: 3.6 K/uL — ABNORMAL LOW (ref 4.0–10.5)

## 2024-04-30 LAB — BASIC METABOLIC PANEL WITH GFR
BUN: 12 mg/dL (ref 6–23)
CO2: 32 meq/L (ref 19–32)
Calcium: 9.1 mg/dL (ref 8.4–10.5)
Chloride: 103 meq/L (ref 96–112)
Creatinine, Ser: 0.83 mg/dL (ref 0.40–1.20)
GFR: 71.39 mL/min
Glucose, Bld: 86 mg/dL (ref 70–99)
Potassium: 4.2 meq/L (ref 3.5–5.1)
Sodium: 140 meq/L (ref 135–145)

## 2024-04-30 LAB — LIPID PANEL
Cholesterol: 158 mg/dL (ref 28–200)
HDL: 42.1 mg/dL
LDL Cholesterol: 96 mg/dL (ref 10–99)
NonHDL: 116.3
Total CHOL/HDL Ratio: 4
Triglycerides: 100 mg/dL (ref 10.0–149.0)
VLDL: 20 mg/dL (ref 0.0–40.0)

## 2024-04-30 LAB — URINALYSIS, ROUTINE W REFLEX MICROSCOPIC
Hgb urine dipstick: NEGATIVE
Leukocytes,Ua: NEGATIVE
Nitrite: NEGATIVE
Specific Gravity, Urine: 1.025 (ref 1.000–1.030)
Total Protein, Urine: NEGATIVE
Urine Glucose: NEGATIVE
Urobilinogen, UA: 1 (ref 0.0–1.0)
pH: 6 (ref 5.0–8.0)

## 2024-04-30 LAB — TSH: TSH: 1.69 u[IU]/mL (ref 0.35–5.50)

## 2024-04-30 LAB — HEPATIC FUNCTION PANEL
ALT: 12 U/L (ref 3–35)
AST: 20 U/L (ref 5–37)
Albumin: 3.9 g/dL (ref 3.5–5.2)
Alkaline Phosphatase: 95 U/L (ref 39–117)
Bilirubin, Direct: 0 mg/dL — ABNORMAL LOW (ref 0.1–0.3)
Total Bilirubin: 0.3 mg/dL (ref 0.2–1.2)
Total Protein: 7.4 g/dL (ref 6.0–8.3)

## 2024-04-30 LAB — VITAMIN B12: Vitamin B-12: 843 pg/mL (ref 211–911)

## 2024-04-30 LAB — HEMOGLOBIN A1C: Hgb A1c MFr Bld: 6.3 % (ref 4.6–6.5)

## 2024-04-30 LAB — VITAMIN D 25 HYDROXY (VIT D DEFICIENCY, FRACTURES): VITD: 28.1 ng/mL — ABNORMAL LOW (ref 30.00–100.00)

## 2024-04-30 MED ORDER — AMITRIPTYLINE HCL 50 MG PO TABS: 50.0000 mg | ORAL_TABLET | Freq: Every day | ORAL | 1 refills | Status: AC

## 2024-04-30 MED ORDER — METOPROLOL SUCCINATE ER 100 MG PO TB24: ORAL_TABLET | ORAL | 3 refills | Status: AC

## 2024-04-30 MED ORDER — VENLAFAXINE HCL ER 150 MG PO CP24: ORAL_CAPSULE | ORAL | 3 refills | Status: AC

## 2024-04-30 NOTE — Assessment & Plan Note (Signed)
 Lab Results  Component Value Date   VITAMINB12 843 04/30/2024   Stable, cont oral replacement - b12 1000 mcg qd

## 2024-04-30 NOTE — Assessment & Plan Note (Signed)
 Lab Results  Component Value Date   HGBA1C 6.3 04/30/2024   Stable, pt to continue current medical treatment  - diet,wt control

## 2024-04-30 NOTE — Assessment & Plan Note (Addendum)
 Chronic stable, continue effexor  xr

## 2024-04-30 NOTE — Progress Notes (Signed)
 Patient ID: Suzanne [name], female   DOB: December 30, 1955, 69 y.o.   MRN: [id number]        Chief Complaint: follow up HTN, HLD and hyperglycemia , low b12 and D, anxiety       HPI:  Suzanne [name] is a 69 y.o. female here overall doing ok, Pt denies chest pain, increased sob or doe, wheezing, orthopnea, PND, increased LE swelling, palpitations, dizziness or syncope.   Pt denies polydipsia, polyuria, or new focal neuro s/s.    Pt denies polydipsia, polyuria, or new focal neuro s/s. Denies worsening depressive symptoms, suicidal ideation, or panic;       Wt Readings from Last 3 Encounters:  04/30/24 239 lb (108.4 kg)  10/12/23 241 lb 2 oz (109.4 kg)  07/05/23 240 lb 8 oz (109.1 kg)   BP Readings from Last 3 Encounters:  04/30/24 138/76  10/12/23 138/78  07/29/23 136/71         Past Medical History:  Diagnosis Date   Anxiety    ASCUS of cervix with negative high risk HPV 06/2018   Chronic headaches    Depression    Diabetes mellitus, type 2 (HCC)    Diverticulosis    Gallstones    GERD    Hepatitis B    HYPERTENSION    OBESITY    RAYNAUDS SYNDROME    Seizures (HCC)    age 61 only one time   Stammering    Past Surgical History:  Procedure Laterality Date   CHOLECYSTECTOMY  2000   COLONOSCOPY     OTHER SURGICAL HISTORY  2000   ? if for diverticulosis, pt states they went through beely button    reports that she has never smoked. She has never used smokeless tobacco. She reports that she does not drink alcohol and does not use drugs. family history includes Allergies in her brother and son; Breast cancer (age of onset: 42 - 72) in her cousin and cousin; Clotting disorder in her maternal aunt; Colon cancer in her maternal aunt; Diabetes in her maternal aunt and paternal aunt; Heart disease in her paternal uncle; Liver disease in her brother; Lung cancer in her father and maternal aunt; Pancreatic cancer in her maternal uncle. Allergies[1] Medications Ordered  Prior to Encounter[2]      ROS:  All others reviewed and negative.  Objective        PE:  BP 138/76 (BP Location: Left Arm, Patient Position: Sitting, Cuff Size: Normal)   Pulse 75   Temp 98.1 F (36.7 C) (Oral)   Ht 5' 4 (1.626 m)   Wt 239 lb (108.4 kg)   SpO2 100%   BMI 41.02 kg/m                 Constitutional: Pt appears in NAD               HENT: Head: NCAT.                Right Ear: External ear normal.                 Left Ear: External ear normal.                Eyes: . Pupils are equal, round, and reactive to light. Conjunctivae and EOM are normal               Nose: without d/c or deformity  Neck: Neck supple. Gross normal ROM               Cardiovascular: Normal rate and regular rhythm.                 Pulmonary/Chest: Effort normal and breath sounds without rales or wheezing.                Abd:  Soft, NT, ND, + BS, no organomegaly               Neurological: Pt is alert. At baseline orientation, motor grossly intact               Skin: Skin is warm. No rashes, no other new lesions, LE edema - none               Psychiatric: Pt behavior is normal without agitation   Micro: none  Cardiac tracings I have personally interpreted today:  none  Pertinent Radiological findings (summarize): none   Lab Results  Component Value Date   WBC 3.6 (L) 04/30/2024   HGB 12.6 04/30/2024   HCT 38.4 04/30/2024   PLT 234.0 04/30/2024   GLUCOSE 86 04/30/2024   CHOL 158 04/30/2024   TRIG 100.0 04/30/2024   HDL 42.10 04/30/2024   LDLCALC 96 04/30/2024   ALT 12 04/30/2024   AST 20 04/30/2024   NA 140 04/30/2024   K 4.2 04/30/2024   CL 103 04/30/2024   CREATININE 0.83 04/30/2024   BUN 12 04/30/2024   CO2 32 04/30/2024   TSH 1.69 04/30/2024   HGBA1C 6.3 04/30/2024   MICROALBUR 0.8 07/05/2023   Assessment/Plan:  Suzanne [name] is a 69 y.o. Black or African American [2] female with  has a past medical history of Anxiety, ASCUS of cervix with negative  high risk HPV (06/2018), Chronic headaches, Depression, Diabetes mellitus, type 2 (HCC), Diverticulosis, Gallstones, GERD, Hepatitis B, HYPERTENSION, OBESITY, RAYNAUDS SYNDROME, Seizures (HCC), and Stammering.  Vitamin D  deficiency Last vitamin D  Lab Results  Component Value Date   VD25OH 28.10 (L) 04/30/2024   Low, to start  oral replacement   Pre-diabetes Lab Results  Component Value Date   HGBA1C 6.3 04/30/2024   Stable, pt to continue current medical treatment  - diet,wt control   Hypertension, uncontrolled BP Readings from Last 3 Encounters:  04/30/24 138/76  10/12/23 138/78  07/29/23 136/71   Stable, pt to continue medical treatment toprol  xl 100 qd ]  HLD (hyperlipidemia) Lab Results  Component Value Date   LDLCALC 96 04/30/2024   Stable, pt to continue current statin  - diet, wt control  B12 deficiency Lab Results  Component Value Date   VITAMINB12 843 04/30/2024   Stable, cont oral replacement - b12 1000 mcg qd   Anxiety Chronic stable, continue effexor  xr  Followup: Return in about 6 months (around 10/28/2024).  Lynwood Rush, MD 04/30/2024 8:08 PM [facility]     [1]  Allergies Allergen Reactions   Other Swelling    ALL SEAFOOD   Shrimp [Shellfish Allergy] Swelling    Pt states she allergic to seafood  [2]  Current Outpatient Medications on File Prior to Visit  Medication Sig Dispense Refill   Baclofen  5 MG TABS Take 1 tablet (5 mg total) by mouth Nightly. 10 tablet 0   clotrimazole -betamethasone  (LOTRISONE ) cream Use as directed twice per day as needed 45 g 1   neomycin -polymyxin-hydrocortisone (CORTISPORIN) OTIC solution Place 3  drops into the right ear 4 (four) times daily. 10 mL 0   nystatin  (MYCOSTATIN /NYSTOP ) powder Apply 1 application topically 3 (three) times daily. 15 g 2   No current facility-administered medications on file prior to visit.

## 2024-04-30 NOTE — Assessment & Plan Note (Signed)
 BP Readings from Last 3 Encounters:  04/30/24 138/76  10/12/23 138/78  07/29/23 136/71   Stable, pt to continue medical treatment toprol  xl 100 qd ]

## 2024-04-30 NOTE — Assessment & Plan Note (Signed)
 Lab Results  Component Value Date   LDLCALC 96 04/30/2024   Stable, pt to continue current statin  - diet, wt control

## 2024-04-30 NOTE — Patient Instructions (Signed)

## 2024-04-30 NOTE — Assessment & Plan Note (Signed)
 Last vitamin D  Lab Results  Component Value Date   VD25OH 28.10 (L) 04/30/2024   Low, to start  oral replacement

## 2024-05-09 ENCOUNTER — Ambulatory Visit

## 2024-05-09 VITALS — Ht 64.0 in | Wt 239.0 lb

## 2024-05-09 DIAGNOSIS — Z0000 Encounter for general adult medical examination without abnormal findings: Secondary | ICD-10-CM | POA: Diagnosis not present

## 2024-05-09 NOTE — Progress Notes (Signed)
 "  Chief Complaint  Patient presents with   Medicare Wellness     Subjective:   Suzanne [name] is a 69 y.o. female who presents for a Medicare Annual Wellness Visit.  Visit info / Clinical Intake: Medicare Wellness Visit Type:: Subsequent Annual Wellness Visit Persons participating in visit and providing information:: patient Medicare Wellness Visit Mode:: Video Since this visit was completed virtually, some vitals may be partially provided or unavailable. Missing vitals are due to the limitations of the virtual format.: Unable to obtain vitals - no equipment If Telephone or Video please confirm:: I connected with patient using audio/video enable telemedicine. I verified patient identity with two identifiers, discussed telehealth limitations, and patient agreed to proceed. Patient Location:: home Provider Location:: office Interpreter Needed?: No Pre-visit prep was completed: yes AWV questionnaire completed by patient prior to visit?: no Living arrangements:: lives with spouse/significant other Patient's Overall Health Status Rating: good Typical amount of pain: none Does pain affect daily life?: no Are you currently prescribed opioids?: no  Dietary Habits and Nutritional Risks How many meals a day?: (!) 1 Eats fruit and vegetables daily?: yes Most meals are obtained by: preparing own meals In the last 2 weeks, have you had any of the following?: none  Functional Status Activities of Daily Living (to include ambulation/medication): Independent Ambulation: Independent with device- listed below Home Assistive Devices/Equipment: Eyeglasses Medication Administration: Independent Home Management (perform basic housework or laundry): Independent Manage your own finances?: yes Primary transportation is: driving Concerns about vision?: (!) yes Concerns about hearing?: (!) yes Uses hearing aids?: no (per patient has some hearing loss) Hear whispered voice?: (!) no  *in-person visit only*  Fall Screening Falls in the past year?: 0 Number of falls in past year: 0 Was there an injury with Fall?: 0 Fall Risk Category Calculator: 0 Patient Fall Risk Level: Low Fall Risk  Fall Risk Patient at Risk for Falls Due to: No Fall Risks Fall risk Follow up: Falls evaluation completed; Falls prevention discussed  Home and Transportation Safety: All rugs have non-skid backing?: N/A, no rugs All stairs or steps have railings?: (!) no Grab bars in the bathtub or shower?: (!) no Have non-skid surface in bathtub or shower?: yes Good home lighting?: yes Regular seat belt use?: yes Hospital stays in the last year:: no  Cognitive Assessment Difficulty concentrating, remembering, or making decisions? : no Will 6CIT or Mini Cog be Completed: no 6CIT or Mini Cog Declined: patient alert, oriented, able to answer questions appropriately and recall recent events  Advance Directives (For Healthcare) Does Patient Have a Medical Advance Directive?: Yes Type of Advance Directive: Healthcare Power of Suzanne [name]; Living will Copy of Healthcare Power of Attorney in Chart?: No - copy requested Copy of Living Will in Chart?: No - copy requested  Reviewed/Updated  Reviewed/Updated: Reviewed All (Medical, Surgical, Family, Medications, Allergies, Care Teams, Patient Goals)    Allergies (verified) Other and Shrimp [shellfish allergy]   Current Medications (verified) Outpatient Encounter Medications as of 05/09/2024  Medication Sig   amitriptyline  (ELAVIL ) 50 MG tablet Take 1 tablet (50 mg total) by mouth at bedtime.   Baclofen  5 MG TABS Take 1 tablet (5 mg total) by mouth Nightly.   clotrimazole -betamethasone  (LOTRISONE ) cream Use as directed twice per day as needed   metoprolol  succinate (TOPROL -XL) 100 MG 24 hr tablet TAKE 1 TABLET BY MOUTH ONCE DAILY WITH MEALS OR  IMMEDIATELY  FOLLOWING  A  MEAL   neomycin -polymyxin-hydrocortisone (CORTISPORIN) OTIC solution Place 3  drops  into the right ear 4 (four) times daily.   nystatin  (MYCOSTATIN /NYSTOP ) powder Apply 1 application topically 3 (three) times daily.   venlafaxine  XR (EFFEXOR -XR) 150 MG 24 hr capsule TAKE 1 CAPSULE BY MOUTH ONCE DAILY WITH BREAKFAST   No facility-administered encounter medications on file as of 05/09/2024.    History: Past Medical History:  Diagnosis Date   Anxiety    ASCUS of cervix with negative high risk HPV 06/2018   Chronic headaches    Depression    Diabetes mellitus, type 2 (HCC)    Diverticulosis    Gallstones    GERD    Hepatitis B    HYPERTENSION    OBESITY    RAYNAUDS SYNDROME    Seizures (HCC)    age 86 only one time   Stammering    Past Surgical History:  Procedure Laterality Date   CHOLECYSTECTOMY  2000   COLONOSCOPY     OTHER SURGICAL HISTORY  2000   ? 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  Tobacco Use   Smoking status: Never   Smokeless tobacco: Never  Vaping Use   Vaping status: Never Used  Substance and Sexual Activity   Alcohol use: No   Drug use: No   Sexual activity: Yes    Partners: Male    Birth control/protection: Post-menopausal    Comment: 1st intercourse 51 yo-5 partners   Tobacco Counseling Counseling given: Not Answered  SDOH Screenings   Food Insecurity: No Food Insecurity (05/09/2024)  Housing: Unknown (05/09/2024)  Transportation Needs: No Transportation Needs (05/09/2024)  Utilities: Not At Risk  (05/09/2024)  Alcohol Screen: Low Risk (05/02/2023)  Depression (PHQ2-9): Medium Risk (05/09/2024)  Financial Resource Strain: Low Risk (05/02/2023)  Physical Activity: Insufficiently Active (05/09/2024)  Social Connections: Socially Integrated (05/09/2024)  Stress: Stress Concern Present (05/09/2024)  Tobacco Use: Low Risk (05/09/2024)  Health Literacy: Adequate Health Literacy (05/09/2024)   See flowsheets for full screening details  Depression Screen PHQ 2 & 9 Depression Scale- Over the past 2 weeks, how often have you been bothered by any of the following problems? Little interest or pleasure in doing things: 1 Feeling down, depressed, or hopeless (PHQ Adolescent also includes...irritable): 1 PHQ-2 Total Score: 2 Trouble falling or staying asleep, or sleeping too much: 3 (staying asleep) Feeling tired or having little energy: 0 Poor appetite or overeating (PHQ Adolescent also includes...weight loss): 0 Feeling bad about yourself - or that you are a failure or have let yourself or your family down: 1 Trouble concentrating on things, such as reading the newspaper or watching television (PHQ Adolescent also includes...like school work): 0 Moving or speaking so slowly that other people could have noticed. Or the opposite - being so fidgety or restless that you have been moving around a lot more than usual: 0 Thoughts that you would be better off  dead, or of hurting yourself in some way: 0 PHQ-9 Total Score: 6 If you checked off any problems, how difficult have these problems made it for you to do your work, take care of things at home, or get along with other people?: Somewhat difficult     Goals Addressed             This Visit's Progress    My goal for 2024 is to join [employer] Program at [employer].   On track            Objective:    Today's Vitals   05/09/24 0816  Weight: 239 lb (108.4 kg)  Height: 5' 4 (1.626 m)   Body mass index is 41.02 kg/m.  Hearing/Vision  screen Hearing Screening - Comments:: per patient has some hearing loss/would like a hearing test Immunizations and Health Maintenance Health Maintenance  Topic Date Due   Zoster Vaccines- Shingrix (2 of 2) 04/21/2024   Medicare Annual Wellness (AWV)  05/09/2025   Mammogram  11/30/2025   Colonoscopy  03/02/2026   Pneumococcal Vaccine: 50+ Years  Completed   Influenza Vaccine  Completed   Bone Density Scan  Completed   Hepatitis C Screening  Completed   Meningococcal B Vaccine  Aged Out   DTaP/Tdap/Td  Discontinued   Hepatitis B Vaccines 19-59 Average Risk  Discontinued   COVID-19 Vaccine  Discontinued        Assessment/Plan:  This is a routine wellness examination for Suzanne [name].  Patient Care Team: Norleen Lynwood ORN, MD as PCP - General (Internal Medicine) Austin Ned, MD as Referring Physician (Obstetrics and Gynecology) Aneita Gwendlyn DASEN, MD (Inactive) as Referring Physician (Gastroenterology) Shelah Lamar RAMAN, MD as Referring Physician (Pulmonary Disease) Raj Rankin SAUNDERS, MD as Referring Physician (Ophthalmology) [place], [place], [place] as Referring Physician (Optometry)  I have personally reviewed and noted the following in the patients chart:   Medical and social history Use of alcohol, tobacco or illicit drugs  Current medications and supplements including opioid prescriptions. Functional ability and status Nutritional status Physical activity Advanced directives List of other physicians Hospitalizations, surgeries, and ER visits in previous 12 months Vitals Screenings to include cognitive, depression, and falls Referrals and appointments  No orders of the defined types were placed in this encounter.  In addition, I have reviewed and discussed with patient certain preventive protocols, quality metrics, and best practice recommendations. A written personalized care plan for preventive services as well as general preventive health recommendations were provided to  patient.   Suzanne [name], CMA   05/09/2024   Return in 1 year (on 05/09/2025).  After Visit Summary: (MyChart) Due to this being a telephonic visit, the after visit summary with patients personalized plan was offered to patient via MyChart   Nurse Notes: Patient is due for a 2nd shingrix vaccine.  Patient is also requesting to have a hearing test. Order sent to provider.  Patient also has requested to speak with a counselor in regards to her PHQ9.  I have sent a referral to community resources out today. "

## 2024-05-09 NOTE — Patient Instructions (Addendum)
 Suzanne [name],  Thank you for taking the time for your Medicare Wellness Visit. I appreciate your continued commitment to your health goals. Please review the care plan we discussed, and feel free to reach out if I can assist you further.  Please note that Annual Wellness Visits do not include a physical exam. Some assessments may be limited, especially if the visit was conducted virtually. If needed, we may recommend an in-person follow-up with your provider.  Ongoing Care Seeing your primary care provider every 3 to 6 months helps us  monitor your health and provide consistent, personalized care.  Last office visit on 04/30/2024.  You are due for a 2nd shingles vaccine and can get that done at your local pharmacy. I have sent a text and email to your phone in regards to discussion today about counseling.     Referrals If a referral was made during today's visit and you haven't received any updates within two weeks, please contact the referred provider directly to check on the status.  Recommended Screenings:  Health Maintenance  Topic Date Due   Zoster (Shingles) Vaccine (2 of 2) 04/21/2024   Medicare Annual Wellness Visit  05/09/2025   Breast Cancer Screening  11/30/2025   Colon Cancer Screening  03/02/2026   Pneumococcal Vaccine for age over 33  Completed   Flu Shot  Completed   Osteoporosis screening with Bone Density Scan  Completed   Hepatitis C Screening  Completed   Meningitis B Vaccine  Aged Out   DTaP/Tdap/Td vaccine  Discontinued   Hepatitis B Vaccine  Discontinued   COVID-19 Vaccine  Discontinued       05/09/2024    8:20 AM  Advanced Directives  Does Patient Have a Medical Advance Directive? Yes  Type of Estate Agent of [place];Living will  Copy of Healthcare Power of Attorney in Chart? No - copy requested    Vision: Annual vision screenings are recommended for early detection of glaucoma, cataracts, and diabetic retinopathy. These exams  can also reveal signs of chronic conditions such as diabetes and high blood pressure.  Dental: Annual dental screenings help detect early signs of oral cancer, gum disease, and other conditions linked to overall health, including heart disease and diabetes.  Please see the attached documents for additional preventive care recommendations.

## 2024-05-16 ENCOUNTER — Telehealth: Payer: Self-pay

## 2024-05-16 MED ORDER — WEGOVY 1.5 MG PO TABS: 1.5000 mg | ORAL_TABLET | Freq: Every day | ORAL | 2 refills | Status: AC

## 2024-05-16 NOTE — Telephone Encounter (Signed)
 Copied from CRM #[id number]. Topic: Clinical - Medication Question >> May 16, 2024  9:29 AM Deleta RAMAN wrote: Reason for CRM: patient had concerns regarding weight loss medication that was discussed with pcp. She is hoping to get on the medication before Dr. Norleen is gone. States the medication is under [facility]. Please contact patient regarding any questions or concerns. [phone] Pill formed medication.

## 2024-05-16 NOTE — Telephone Encounter (Addendum)
 Ok for [employer] 1.5 mg daily - I will send  Please let pt know that she should also text SAVE to a certain number (I am not sure of the number) she will get a coupon.    This was sent to [facility]

## 2024-05-16 NOTE — Addendum Note (Signed)
 Addended by: NORLEEN LYNWOOD ORN on: 05/16/2024 12:49 PM   Modules accepted: Orders

## 2024-05-21 ENCOUNTER — Ambulatory Visit: Attending: Internal Medicine | Admitting: Audiologist

## 2024-05-21 ENCOUNTER — Encounter: Payer: Self-pay | Admitting: Audiologist

## 2024-05-21 DIAGNOSIS — H903 Sensorineural hearing loss, bilateral: Secondary | ICD-10-CM | POA: Diagnosis present

## 2024-05-21 NOTE — Procedures (Signed)
" °  Outpatient Rehabilitation and [facility] [address] [place], [place] [address] [phone]  AUDIOLOGICAL EVALUATION  Name: Suzanne [name]    Status: Outpatient DOB: [birth year]    Referent: Norleen Lynwood ORN, MD MRN: [id number] Date: 05/21/2024     Diagnosis: Sensorineural hearing loss, bilateral   HISTORY: Dynver, 69 y.o., was seen for an audiological evaluation.  Ashten notices increased difficulty hearing.  Nykerria notes no current ear pain, pressure, or fullness.  Jarielys reports no history of noise exposure, family history of hearing loss, or history of ear surgery. Suraiya has some history of ear infections. She noted occasional tinnitus.         EVALUATION: Otoscopic inspection reveals clear ear canals with visible tympanic membranes.   Tympanometry was completed to assess middle ear status. Hypermobile, Type Ad tympanograms were obtained bilaterally. This indicates increased middle ear compliance.  Standard audiometric techniques were used to obtain thresholds under high frequency headphones and insert earphones. Speech reception thresholds are 20 dBHL on the right and 20 dBHL on the left using recorded spondee word lists. Word recognition was 88% at 60 dBHL on the right and 92% at 55 dBHL on the left using recorded NU-6 word lists, in quiet. A mild high frequency sensorineural hearing loss is present bilaterally, right ear poorer than left ear. The left ear shows mild hearing loss at 8000Hz , and the right ear shows mild hearing loss at 2000-3000Hz  and 6000Hz -8000Hz .   CONCLUSION:  Jyl has a mild high frequency sensorineural hearing loss bilaterally. The right ear has slightly poorer hearing than the left ear, although not so much as to be considered a clinically significant asymmetry of hearing. Findings were reviewed with the patient.   RECOMMENDATIONS: Monitor hearing closely with a repeat audiological evaluation in 1 year or sooner if a change in hearing  occurs. Use of communication strategies to improve communication in difficult listening situations.  Delon EMERSON Baumgartner, Au.D., CCC-A Audiologist 05/21/2024  cc: Norleen Lynwood ORN, MD  "

## 2024-05-27 ENCOUNTER — Ambulatory Visit: Payer: Self-pay

## 2024-05-29 NOTE — Progress Notes (Unsigned)
" ° °  Acute Office Visit  Subjective:     Patient ID: Suzanne [name], female    DOB: 05/23/1955, 69 y.o.   MRN: [id number]  No chief complaint on file.   HPI  Discussed the use of AI scribe software for clinical note transcription with the patient, who gave verbal consent to proceed.  History of Present Illness      ROS Per HPI      Objective:    There were no vitals taken for this visit.   Physical Exam Vitals and nursing note reviewed.  Constitutional:      General: She is not in acute distress.    Appearance: Normal appearance. She is normal weight.  HENT:     Head: Normocephalic and atraumatic.     Right Ear: External ear normal.     Left Ear: External ear normal.     Nose: Nose normal.     Mouth/Throat:     Mouth: Mucous membranes are moist.     Pharynx: Oropharynx is clear.  Eyes:     Extraocular Movements: Extraocular movements intact.     Pupils: Pupils are equal, round, and reactive to light.  Cardiovascular:     Rate and Rhythm: Normal rate and regular rhythm.     Pulses: Normal pulses.     Heart sounds: Normal heart sounds.  Pulmonary:     Effort: Pulmonary effort is normal. No respiratory distress.     Breath sounds: Normal breath sounds. No wheezing, rhonchi or rales.  Musculoskeletal:        General: Normal range of motion.     Cervical back: Normal range of motion.     Right lower leg: No edema.     Left lower leg: No edema.  Lymphadenopathy:     Cervical: No cervical adenopathy.  Neurological:     General: No focal deficit present.     Mental Status: She is alert and oriented to person, place, and time.  Psychiatric:        Mood and Affect: Mood normal.        Thought Content: Thought content normal.     No results found for any visits on 05/30/24.      Assessment & Plan:   Assessment and Plan Assessment & Plan      No orders of the defined types were placed in this encounter.    No orders of the defined types  were placed in this encounter.   No follow-ups on file.  Corean LITTIE Ku, FNP  "

## 2024-05-29 NOTE — Telephone Encounter (Signed)
 Called and left voicemail for Pt to give the office a callback to get scheduled with one of our available providers to be evaluated for the dizziness and symptoms.

## 2024-05-29 NOTE — Telephone Encounter (Signed)
 Patient returning call to office to schedule appt. Patient states that her symptoms today are a little better compared to when she was triaged. Office visit scheduled for tomorrow 05/30/24.

## 2024-05-30 ENCOUNTER — Ambulatory Visit: Admitting: Family Medicine

## 2024-05-30 ENCOUNTER — Ambulatory Visit: Payer: Self-pay | Admitting: Family Medicine

## 2024-05-30 ENCOUNTER — Encounter: Payer: Self-pay | Admitting: Family Medicine

## 2024-05-30 VITALS — BP 134/84 | HR 70 | Temp 97.8°F | Ht 64.0 in | Wt 238.0 lb

## 2024-05-30 DIAGNOSIS — R295 Transient paralysis: Secondary | ICD-10-CM

## 2024-05-30 DIAGNOSIS — R42 Dizziness and giddiness: Secondary | ICD-10-CM

## 2024-05-30 DIAGNOSIS — R7303 Prediabetes: Secondary | ICD-10-CM

## 2024-05-30 DIAGNOSIS — R519 Headache, unspecified: Secondary | ICD-10-CM

## 2024-05-30 DIAGNOSIS — I10 Essential (primary) hypertension: Secondary | ICD-10-CM

## 2024-05-30 DIAGNOSIS — E7800 Pure hypercholesterolemia, unspecified: Secondary | ICD-10-CM

## 2024-05-30 LAB — COMPREHENSIVE METABOLIC PANEL WITH GFR
ALT: 14 U/L (ref 3–35)
AST: 22 U/L (ref 5–37)
Albumin: 4.2 g/dL (ref 3.5–5.2)
Alkaline Phosphatase: 103 U/L (ref 39–117)
BUN: 16 mg/dL (ref 6–23)
CO2: 32 meq/L (ref 19–32)
Calcium: 9.6 mg/dL (ref 8.4–10.5)
Chloride: 100 meq/L (ref 96–112)
Creatinine, Ser: 0.83 mg/dL (ref 0.40–1.20)
GFR: 71.35 mL/min
Glucose, Bld: 89 mg/dL (ref 70–99)
Potassium: 4.5 meq/L (ref 3.5–5.1)
Sodium: 138 meq/L (ref 135–145)
Total Bilirubin: 0.3 mg/dL (ref 0.2–1.2)
Total Protein: 7.9 g/dL (ref 6.0–8.3)

## 2024-05-30 LAB — D-DIMER, QUANTITATIVE: D-Dimer, Quant: 0.53 ug{FEU}/mL — ABNORMAL HIGH

## 2024-05-30 LAB — CBC WITH DIFFERENTIAL/PLATELET
Basophils Absolute: 0 10*3/uL (ref 0.0–0.1)
Basophils Relative: 1 % (ref 0.0–3.0)
Eosinophils Absolute: 0.1 10*3/uL (ref 0.0–0.7)
Eosinophils Relative: 2.3 % (ref 0.0–5.0)
HCT: 38.9 % (ref 36.0–46.0)
Hemoglobin: 12.7 g/dL (ref 12.0–15.0)
Lymphocytes Relative: 30.1 % (ref 12.0–46.0)
Lymphs Abs: 1.4 10*3/uL (ref 0.7–4.0)
MCHC: 32.7 g/dL (ref 30.0–36.0)
MCV: 87.1 fl (ref 78.0–100.0)
Monocytes Absolute: 0.4 10*3/uL (ref 0.1–1.0)
Monocytes Relative: 9 % (ref 3.0–12.0)
Neutro Abs: 2.7 10*3/uL (ref 1.4–7.7)
Neutrophils Relative %: 57.6 % (ref 43.0–77.0)
Platelets: 243 10*3/uL (ref 150.0–400.0)
RBC: 4.47 Mil/uL (ref 3.87–5.11)
RDW: 15.3 % (ref 11.5–15.5)
WBC: 4.7 10*3/uL (ref 4.0–10.5)

## 2024-05-30 NOTE — Patient Instructions (Signed)
 We are checking labs today, will be in contact with any results that require further attention  Referral to neurology for further evaluation today. Someone will be in contact with you to get this scheduled.   I have ordered a head CT for you for further evaluation, we will be in touch once results are received.   Follow-up with me for new or worsening symptoms.

## 2024-06-04 ENCOUNTER — Other Ambulatory Visit

## 2024-12-01 ENCOUNTER — Ambulatory Visit: Admitting: Family Medicine
